# Patient Record
Sex: Male | Born: 1955 | Hispanic: Yes | Marital: Single | Smoking: Former smoker
Health system: Southern US, Community
[De-identification: ages and names within clinical notes are randomized; demographics above are authoritative.]

## PROBLEM LIST (undated history)

## (undated) DIAGNOSIS — G8929 Other chronic pain: Secondary | ICD-10-CM

## (undated) DIAGNOSIS — G4730 Sleep apnea, unspecified: Secondary | ICD-10-CM

## (undated) DIAGNOSIS — Z992 Dependence on renal dialysis: Secondary | ICD-10-CM

## (undated) DIAGNOSIS — M1990 Unspecified osteoarthritis, unspecified site: Secondary | ICD-10-CM

## (undated) DIAGNOSIS — K219 Gastro-esophageal reflux disease without esophagitis: Secondary | ICD-10-CM

## (undated) DIAGNOSIS — Z720 Tobacco use: Secondary | ICD-10-CM

## (undated) DIAGNOSIS — I272 Pulmonary hypertension, unspecified: Secondary | ICD-10-CM

## (undated) DIAGNOSIS — N189 Chronic kidney disease, unspecified: Secondary | ICD-10-CM

## (undated) DIAGNOSIS — I509 Heart failure, unspecified: Secondary | ICD-10-CM

## (undated) DIAGNOSIS — I639 Cerebral infarction, unspecified: Secondary | ICD-10-CM

## (undated) DIAGNOSIS — N179 Acute kidney failure, unspecified: Secondary | ICD-10-CM

## (undated) DIAGNOSIS — D649 Anemia, unspecified: Secondary | ICD-10-CM

## (undated) DIAGNOSIS — M549 Dorsalgia, unspecified: Secondary | ICD-10-CM

## (undated) DIAGNOSIS — I209 Angina pectoris, unspecified: Secondary | ICD-10-CM

## (undated) DIAGNOSIS — N186 End stage renal disease: Secondary | ICD-10-CM

## (undated) DIAGNOSIS — I10 Essential (primary) hypertension: Secondary | ICD-10-CM

## (undated) DIAGNOSIS — R0600 Dyspnea, unspecified: Secondary | ICD-10-CM

## (undated) DIAGNOSIS — R42 Dizziness and giddiness: Secondary | ICD-10-CM

## (undated) HISTORY — DX: Chronic kidney disease, unspecified: N18.9

## (undated) HISTORY — DX: Essential (primary) hypertension: I10

## (undated) HISTORY — DX: Acute kidney failure, unspecified: N17.9

---

## 2011-01-25 ENCOUNTER — Inpatient Hospital Stay (HOSPITAL_COMMUNITY): Payer: Medicaid Other

## 2011-01-25 ENCOUNTER — Emergency Department (HOSPITAL_COMMUNITY): Admission: EM | Admit: 2011-01-25 | Payer: Self-pay

## 2011-01-25 ENCOUNTER — Other Ambulatory Visit: Payer: Self-pay

## 2011-01-25 ENCOUNTER — Inpatient Hospital Stay (HOSPITAL_COMMUNITY)
Admission: AD | Admit: 2011-01-25 | Discharge: 2011-01-29 | DRG: 674 | Disposition: A | Discharge status: Home / Self Care | Payer: Medicaid Other | Source: Other Acute Inpatient Hospital | Attending: Internal Medicine | Admitting: Internal Medicine

## 2011-01-25 DIAGNOSIS — E872 Acidosis, unspecified: Secondary | ICD-10-CM | POA: Diagnosis present

## 2011-01-25 DIAGNOSIS — N189 Chronic kidney disease, unspecified: Secondary | ICD-10-CM | POA: Diagnosis present

## 2011-01-25 DIAGNOSIS — M545 Low back pain, unspecified: Secondary | ICD-10-CM | POA: Diagnosis present

## 2011-01-25 DIAGNOSIS — R197 Diarrhea, unspecified: Secondary | ICD-10-CM | POA: Diagnosis present

## 2011-01-25 DIAGNOSIS — Z23 Encounter for immunization: Secondary | ICD-10-CM

## 2011-01-25 DIAGNOSIS — N179 Acute kidney failure, unspecified: Principal | ICD-10-CM | POA: Diagnosis present

## 2011-01-25 DIAGNOSIS — E860 Dehydration: Secondary | ICD-10-CM | POA: Diagnosis present

## 2011-01-25 DIAGNOSIS — I160 Hypertensive urgency: Secondary | ICD-10-CM | POA: Diagnosis present

## 2011-01-25 DIAGNOSIS — D649 Anemia, unspecified: Secondary | ICD-10-CM | POA: Diagnosis present

## 2011-01-25 DIAGNOSIS — I319 Disease of pericardium, unspecified: Secondary | ICD-10-CM | POA: Diagnosis present

## 2011-01-25 DIAGNOSIS — G8929 Other chronic pain: Secondary | ICD-10-CM | POA: Diagnosis present

## 2011-01-25 DIAGNOSIS — I5042 Chronic combined systolic (congestive) and diastolic (congestive) heart failure: Secondary | ICD-10-CM | POA: Diagnosis present

## 2011-01-25 DIAGNOSIS — I130 Hypertensive heart and chronic kidney disease with heart failure and stage 1 through stage 4 chronic kidney disease, or unspecified chronic kidney disease: Secondary | ICD-10-CM | POA: Diagnosis present

## 2011-01-25 DIAGNOSIS — I509 Heart failure, unspecified: Secondary | ICD-10-CM | POA: Diagnosis present

## 2011-01-25 HISTORY — DX: Heart failure, unspecified: I50.9

## 2011-01-25 HISTORY — DX: Anemia, unspecified: D64.9

## 2011-01-25 LAB — COMPREHENSIVE METABOLIC PANEL
Albumin: 1.7 g/dL — ABNORMAL LOW (ref 3.5–5.2)
Alkaline Phosphatase: 86 U/L (ref 39–117)
BUN: 60 mg/dL — ABNORMAL HIGH (ref 6–23)
CO2: 15 mEq/L — ABNORMAL LOW (ref 19–32)
Chloride: 113 mEq/L — ABNORMAL HIGH (ref 96–112)
GFR calc non Af Amer: 6 mL/min — ABNORMAL LOW (ref >90)
Glucose, Bld: 84 mg/dL (ref 70–99)
Potassium: 3.8 mEq/L (ref 3.5–5.1)
Total Bilirubin: 0.2 mg/dL — ABNORMAL LOW (ref 0.3–1.2)

## 2011-01-25 LAB — PROTIME-INR
INR: 1.09 (ref 0.00–1.49)
Prothrombin Time: 14.3 seconds (ref 11.6–15.2)

## 2011-01-25 LAB — CBC
HCT: 38.5 % — ABNORMAL LOW (ref 39.0–52.0)
MCH: 28.4 pg (ref 26.0–34.0)
MCHC: 33.8 g/dL (ref 30.0–36.0)
MCV: 84.2 fL (ref 78.0–100.0)
Platelets: 333 10*3/uL (ref 150–400)
RDW: 15.7 % — ABNORMAL HIGH (ref 11.5–15.5)

## 2011-01-25 LAB — CARDIAC PANEL(CRET KIN+CKTOT+MB+TROPI)
Total CK: 160 U/L (ref 7–232)
Troponin I: <0.3 ng/mL (ref <0.30)

## 2011-01-25 LAB — MAGNESIUM: Magnesium: 2.3 mg/dL (ref 1.5–2.5)

## 2011-01-25 LAB — CREATININE, SERUM: GFR calc Af Amer: 6 mL/min — ABNORMAL LOW (ref >90)

## 2011-01-25 MED ORDER — POLYETHYLENE GLYCOL 3350 17 G PO PACK
17.0000 g | PACK | Freq: Every day | ORAL | Status: DC | PRN
  Filled 2011-01-25: qty 1

## 2011-01-25 MED ORDER — ONDANSETRON HCL 4 MG PO TABS: 4.0000 mg | ORAL_TABLET | Freq: Four times a day (QID) | ORAL | Status: DC | PRN

## 2011-01-25 MED ORDER — SODIUM CHLORIDE 0.9 % IV SOLN
INTRAVENOUS | Status: DC
  Administered 2011-01-25 – 2011-01-26 (×2): via INTRAVENOUS

## 2011-01-25 MED ORDER — HYDRALAZINE HCL 20 MG/ML IJ SOLN
10.0000 mg | Freq: Three times a day (TID) | INTRAMUSCULAR | Status: DC | PRN
  Administered 2011-01-26: 10 mg via INTRAVENOUS
  Filled 2011-01-25 (×2): qty 0.5

## 2011-01-25 MED ORDER — ACETAMINOPHEN 325 MG PO TABS
650.0000 mg | ORAL_TABLET | Freq: Four times a day (QID) | ORAL | Status: DC | PRN
  Administered 2011-01-28: 650 mg via ORAL
  Filled 2011-01-25: qty 2

## 2011-01-25 MED ORDER — OXYCODONE HCL 5 MG PO TABS
5.0000 mg | ORAL_TABLET | Freq: Four times a day (QID) | ORAL | Status: DC | PRN
  Administered 2011-01-28 – 2011-01-29 (×2): 5 mg via ORAL
  Filled 2011-01-25 (×3): qty 1

## 2011-01-25 MED ORDER — ONDANSETRON HCL 4 MG/2ML IJ SOLN: 4.0000 mg | Freq: Four times a day (QID) | INTRAMUSCULAR | Status: DC | PRN

## 2011-01-25 MED ORDER — LEVALBUTEROL HCL 0.63 MG/3ML IN NEBU
0.6300 mg | INHALATION_SOLUTION | Freq: Four times a day (QID) | RESPIRATORY_TRACT | Status: DC
  Administered 2011-01-25 – 2011-01-26 (×3): 0.63 mg via RESPIRATORY_TRACT
  Filled 2011-01-25 (×7): qty 3

## 2011-01-25 MED ORDER — ENOXAPARIN SODIUM 30 MG/0.3ML ~~LOC~~ SOLN
30.0000 mg | SUBCUTANEOUS | Status: DC
  Administered 2011-01-25 – 2011-01-28 (×4): 30 mg via SUBCUTANEOUS
  Filled 2011-01-25 (×5): qty 0.3

## 2011-01-25 MED ORDER — ACETAMINOPHEN 650 MG RE SUPP: 650.0000 mg | Freq: Four times a day (QID) | RECTAL | Status: DC | PRN

## 2011-01-25 NOTE — H&P (Signed)
JOMAR DENZ MRN: [id number] DOB/AGE: [birth year] 56 y.o. Primary Care Physician:Provider Not In System Admit date: 01/25/2011 Chief Complaint: Diarrhea and abdominal pain  HPI: 56 year old male , who presented to the ED at [facility], with a chief complaint of runny diarrhea for the last 3 months as well as left flank pain. He was found to be in acute renal failure with a creatinine of 9.93. He was found to be fairly hypertensive with systolic blood pressure of 205. He was treated with IV labetalol, prior to being transferred. He has been transferred primarily for his acute renal failure, and the need for dialysis. He denies any hematochezia or melena. Has experienced some nausea but no vomiting for the last several weeks. He has exertional shortness of breath. He denies any chest pain. Denies any fever chills rigors. Denies any recent illness.    No past medical history on file. None  No past surgical history on file. 9  Prior to Admission medications   Not on File   None Allergies: No Known Allergies  Family history negative for diabetes hypertension and dyslipidemia  Social History:  does not have a smoking history on file. He does not have any smokeless tobacco history on file. His alcohol and drug histories not on file.         ROS: A complete 14 point review of systems was done as documented in history of present illness PHYSICAL EXAM: Blood pressure 182/100, pulse 71, temperature 97.9 F (36.6 C), temperature source Oral, resp. rate 20, SpO2 96.00%. General: The patient appears their stated age.  HEENT: No gross abnormalities  Pulmonary: Non labored breathing  Abdomen: Soft and non-tender  Musculoskeletal: There are no major deformities.  Neurologic: No focal weakness or paresthesias are detected,  Skin: There are no ulcer or rashes noted. The right second toe is discolored and tender. There is decreased sensation  Psychiatric: The patient has normal affect.    Cardiovascular: There is a regular rate and rhythm without significant murmur appreciated. Palpable pedal pulse    EKG: Shows left ventricular hypertrophy  No results found for this or any previous visit (from the past 24 hour(s)).  No results found for this or any previous visit (from the past 240 hour(s)).   No results found for this or any previous visit (from the past 48 hour(s)). Laboratory data the hospital shows WBC count of 12.0 hemoglobin of 12.4 hematocrit of 37.5 platelet count of 331, INR of 1.0, sodium 139, potassium 4.1, chloride 112, bicarbonate 15, BUN 58, creatinine 9.93, glucose 94, calcium 7.9, AST of 13 ALT of 22, creatinine kinase of 187 Urine analysis shows a pH of 7.010 urine ketones are negative  Impression:  #1 acute renal failure likely a combination of prerenal, intrarenal etiology, hypertensive nephrosclerosis, dehydration secondary to chronic diarrhea. We'll obtain a CT abdomen and pelvis without contrast. Obtain urine sodium urine creatinine. Nephrology has been consulted. We'll start the patient on IV fluids, to see there is any improvement. Monitor for volume overload. Avoid ACE inhibitors and ARB NSAIDs  #2 hypertension will continue to use when necessary hydralazine. A 2-D echo will be obtained in the morning. We'll cycle cardiac enzymes in the setting of hypertensive urgency           [facility] 01/25/2011, 8:17 PM

## 2011-01-25 NOTE — Progress Notes (Signed)
CRITICAL VALUE ALERT  Critical value received: CK. MB 7.0  Date of notification:  01/25/11 Time of notification: 2045  Critical value read back:yes  Nurse who received alert:  Theresa Mulligan, RN  MD notified (1st page):  Maren Reamer, NP Time of first page: 2048  MD notified (2nd page):  Time of second page: 2100  Responding MD:  Maren Reamer  Time MD responded:  2111  No new ordered received at this time

## 2011-01-26 ENCOUNTER — Other Ambulatory Visit: Payer: Self-pay

## 2011-01-26 ENCOUNTER — Inpatient Hospital Stay (HOSPITAL_COMMUNITY): Payer: Medicaid Other

## 2011-01-26 ENCOUNTER — Encounter (HOSPITAL_COMMUNITY): Payer: Self-pay | Admitting: Family Medicine

## 2011-01-26 LAB — TSH: TSH: 2.12 u[IU]/mL (ref 0.350–4.500)

## 2011-01-26 LAB — BASIC METABOLIC PANEL
CO2: 15 mEq/L — ABNORMAL LOW (ref 19–32)
Calcium: 7.6 mg/dL — ABNORMAL LOW (ref 8.4–10.5)
Creatinine, Ser: 8.75 mg/dL — ABNORMAL HIGH (ref 0.50–1.35)
GFR calc non Af Amer: 6 mL/min — ABNORMAL LOW (ref >90)

## 2011-01-26 LAB — STOOL CULTURE

## 2011-01-26 LAB — IRON AND TIBC
Iron: 57 ug/dL (ref 42–135)
Saturation Ratios: 35 % (ref 20–55)

## 2011-01-26 LAB — URINALYSIS, ROUTINE W REFLEX MICROSCOPIC
Bilirubin Urine: NEGATIVE
Specific Gravity, Urine: 1.019 (ref 1.005–1.030)
pH: 6.5 (ref 5.0–8.0)

## 2011-01-26 LAB — HEMOGLOBIN A1C: Mean Plasma Glucose: 105 mg/dL (ref <117)

## 2011-01-26 LAB — FERRITIN: Ferritin: 317 ng/mL (ref 22–322)

## 2011-01-26 LAB — LIPASE, BLOOD: Lipase: 68 U/L — ABNORMAL HIGH (ref 11–59)

## 2011-01-26 LAB — CBC
MCV: 84.1 fL (ref 78.0–100.0)
Platelets: 307 10*3/uL (ref 150–400)
RDW: 15.5 % (ref 11.5–15.5)
WBC: 10.9 10*3/uL — ABNORMAL HIGH (ref 4.0–10.5)

## 2011-01-26 LAB — URINE MICROSCOPIC-ADD ON

## 2011-01-26 LAB — CARDIAC PANEL(CRET KIN+CKTOT+MB+TROPI): Relative Index: 3.5 — ABNORMAL HIGH (ref 0.0–2.5)

## 2011-01-26 MED ORDER — FUROSEMIDE 10 MG/ML IJ SOLN: 80.0000 mg | Freq: Three times a day (TID) | INTRAMUSCULAR | Status: DC

## 2011-01-26 MED ORDER — LISINOPRIL 20 MG PO TABS
20.0000 mg | ORAL_TABLET | Freq: Every day | ORAL | Status: DC
  Filled 2011-01-26: qty 1

## 2011-01-26 MED ORDER — FUROSEMIDE 10 MG/ML IJ SOLN
160.0000 mg | Freq: Two times a day (BID) | INTRAVENOUS | Status: DC
  Filled 2011-01-26 (×2): qty 16

## 2011-01-26 MED ORDER — METOPROLOL TARTRATE 50 MG PO TABS
50.0000 mg | ORAL_TABLET | Freq: Two times a day (BID) | ORAL | Status: DC
  Administered 2011-01-26 – 2011-01-29 (×7): 50 mg via ORAL
  Filled 2011-01-26 (×8): qty 1

## 2011-01-26 MED ORDER — INFLUENZA VIRUS VACC SPLIT PF IM SUSP
0.5000 mL | INTRAMUSCULAR | Status: AC
  Administered 2011-01-27: 0.5 mL via INTRAMUSCULAR
  Filled 2011-01-26: qty 0.5

## 2011-01-26 MED ORDER — SODIUM BICARBONATE 650 MG PO TABS
650.0000 mg | ORAL_TABLET | Freq: Three times a day (TID) | ORAL | Status: DC
  Administered 2011-01-26 – 2011-01-27 (×3): 650 mg via ORAL
  Filled 2011-01-26 (×5): qty 1

## 2011-01-26 MED ORDER — LEVALBUTEROL HCL 0.63 MG/3ML IN NEBU
0.6300 mg | INHALATION_SOLUTION | Freq: Four times a day (QID) | RESPIRATORY_TRACT | Status: DC | PRN
  Filled 2011-01-26: qty 3

## 2011-01-26 MED ORDER — SODIUM CHLORIDE 0.45 % IV SOLN
INTRAVENOUS | Status: DC
  Filled 2011-01-26: qty 150

## 2011-01-26 MED ORDER — FUROSEMIDE 10 MG/ML IJ SOLN
80.0000 mg | Freq: Three times a day (TID) | INTRAMUSCULAR | Status: AC
  Administered 2011-01-26 – 2011-01-27 (×3): 80 mg via INTRAVENOUS
  Filled 2011-01-26 (×4): qty 8

## 2011-01-26 NOTE — Progress Notes (Addendum)
Subjective: Spoke to patient and he mentions that he has had intermittent blurred vision and headaches for the last 6 months.  Nothing he knows of makes it better or worse.  When he was initially seen at [facility] ED he had a elevated creatinine of 9.93 and it was decided to transfer the patient to [facility] cone for further management and possibly dialysis.   Reportedly his blood pressures have been elevated since admission.  Was placed on hydralazine initially.  Was told that he has never seen a physician before.  Objective: Filed Vitals:   01/26/11 0450 01/26/11 1131 01/26/11 1306 01/26/11 1342  BP: 183/97 174/111 187/119   Pulse: 80 80 83 100  Temp: 98.7 F (37.1 C) 98.1 F (36.7 C) 98.6 F (37 C)   TempSrc: Oral Oral Oral   Resp: 18 17 17 20   Height:      Weight:      SpO2: 95% 98% 99%    Weight change:   Intake/Output Summary (Last 24 hours) at 01/26/11 1415 Last data filed at 01/26/11 1300  Gross per 24 hour  Intake    940 ml  Output    700 ml  Net    240 ml    General: Alert, awake, oriented x3, in no acute distress.  HEENT: No bruits, no goiter.  Heart: Regular rate and rhythm, without murmurs, rubs, gallops.  Lungs: CTA BL Abdomen: Soft, nontender, nondistended, positive bowel sounds.  Neuro: Grossly intact, nonfocal. Answers questions appropriately   Lab Results:  Basename 01/26/11 0540 01/25/11 2116 01/25/11 1942  NA 140 -- 142  K 3.9 -- 3.8  CL 112 -- 113*  CO2 15* -- 15*  GLUCOSE 97 -- 84  BUN 61* -- 60*  CREATININE 8.75* 9.20* --  CALCIUM 7.6* 8.6 --  MG -- 2.3 --  PHOS -- 6.5* --    Basename 01/25/11 1942  AST 6  ALT 7  ALKPHOS 86  BILITOT 0.2*  PROT 5.6*  ALBUMIN 1.7*   No results found for this basename: LIPASE:2,AMYLASE:2 in the last 72 hours  Basename 01/26/11 0540 01/25/11 2116  WBC 10.9* 12.0*  NEUTROABS -- --  HGB 11.4* 13.0  HCT 34.9* 38.5*  MCV 84.1 84.2  PLT 307 333    Basename 01/25/11 1950  CKTOTAL 160  CKMB 7.0*    CKMBINDEX --  TROPONINI <0.30   No components found with this basename: POCBNP:3 No results found for this basename: DDIMER:2 in the last 72 hours  Basename 01/25/11 2116  HGBA1C 5.3   No results found for this basename: CHOL:2,HDL:2,LDLCALC:2,TRIG:2,CHOLHDL:2,LDLDIRECT:2 in the last 72 hours  Basename 01/25/11 2116  TSH 2.120  T4TOTAL --  T3FREE --  THYROIDAB --   No results found for this basename: VITAMINB12:2,FOLATE:2,FERRITIN:2,TIBC:2,IRON:2,RETICCTPCT:2 in the last 72 hours  Micro Results: No results found for this or any previous visit (from the past 240 hour(s)).  Studies/Results: Ct Abdomen Pelvis Wo Contrast  01/26/2011  *RADIOLOGY REPORT*  Clinical Data: Diarrhea, abdominal pain, renal failure, hypertension  CT ABDOMEN AND PELVIS WITHOUT CONTRAST  Technique:  Multidetector CT imaging of the abdomen and pelvis was performed following the standard protocol without intravenous contrast.  Comparison: None.  Findings: Pleural effusions noted bilaterally with basilar atelectasis.  The effusions layer dependently.  Small amount of pericardial fluid noted.  Heart is mildly enlarged.  No hiatal hernia.  Abdomen:  Liver, gallbladder, biliary system,  pancreas, spleen, adrenal glands, and kidneys demonstrate no acute finding and are within normal limits for  noncontrast study.  No evidence of hydronephrosis or obstructing urinary tract calculus.  No abdominal free fluid, fluid collection, hemorrhage, hematoma, adenopathy, or abscess.  No bowel obstruction pattern, dilatation, ileus, or free air.  Normal appendix.  Retained contrast and stool throughout the bowel.  Pelvis:  Foley catheter in the bladder which is decompressed.  No acute distal bowel process, pelvic free fluid, fluid collection, hemorrhage, adenopathy, inguinal abnormality, hernia.  Vascular calcifications evident.  Degenerative changes of the lumbar spine.  No acute osseous finding.  IMPRESSION: Bilateral pleural effusions  layering dependently with basilar atelectasis.  Small pericardial effusion.  Negative for bowel obstruction, dilatation or ileus.  Normal appendix.  No acute intra-abdominal or pelvic process by noncontrast CT.  No obstructing urinary tract stone or obstructive uropathy.  Original Report Authenticated By: Judie Petit. Ruel Favors, M.D.   Dg Chest 2 View  01/25/2011  *RADIOLOGY REPORT*  Clinical Data: Cough, short of breath  CHEST - 2 VIEW  Comparison: 01/25/2011  Findings: Decreased lung volumes with vascular and interstitial prominence.  Basilar atelectasis evident.  Persistent chronic bronchitic changes.  No new consolidation, definite pneumonia, or pneumothorax.  Small effusions noted.  IMPRESSION: Low volume exam with vascular congestion, basilar atelectasis and small effusions.  Minimal interval change.  Original Report Authenticated By: Judie Petit. Ruel Favors, M.D.   US Renal  01/26/2011  *RADIOLOGY REPORT*  Clinical Data:  Acute renal failure  RENAL/URINARY TRACT ULTRASOUND COMPLETE  Comparison:  01/26/2011  Findings: The patient is noted to have a trace ascites and bilateral pleural effusions.  Right Kidney:  Normal in size.  Appears echogenic.  No evidence of mass or hydronephrosis. Mild perinephric fluid.  Left Kidney:  Normal in size. Appears echogenic. No evidence of mass or hydronephrosis. Mild perinephric fluid.  Bladder:  Appears normal for degree of bladder distention.  IMPRESSION: 1.  Bilateral echogenic kidneys without evidence for obstructive uropathy.  Original Report Authenticated By: Rosealee Albee, M.D.    Medications: I have reviewed the patient's current medications.   Patient Active Hospital Problem List: Hypertension Urgency(01/25/2011) At this point hydralazine is on board.  Will plan on adding oral agents.  Will add oral metoprolol today.  Consider other agents like clonidine or amlodipine.  But prefer to add one agent assess response and then add another should patient's BP maintain  elevated.  Pt currently is asymptomatic and has no complaints despite a recent blood pressure of 187/119.   Troponin not elevated currently was <0.30  Acute renal failure (01/25/2011) Per Nephro, likely related to uncontrolled HTN and suspect he has had longstanding elevated blood pressures. Renal ultrasound states no evidence of obstructive uropathy. Will order urine sodium and urine creatinine to calculate FeNa  Diarrhea (01/25/2011) Resolved patient didn't complain of any diarrhea Stool culture and c diff pending.  Disposition:  Pending renal function.  Patient most likely has longstanding hypertension.  Will await nephro's input.  Patient will need PCP as outpatient.      LOS: 1 day   Penny Pia M.D.  [facility] 01/26/2011, 2:15 PM

## 2011-01-26 NOTE — Consult Note (Signed)
Reason for Consult: Elevated Cr,  metabolic acidosis, an d oliguria.  Referring Physician: Penny Pia  PCP: none Insurance: none DOA: 01/25/11  Jonathan Terry is an 56 y.o. male.  HPI: 56 yo M presents following transfer from [facility] ED with complaint of worsening vomiting and diarrhea. He was transferred to [facility] due to AKI with Cr of 9.93 in the setting of elevated BP 204/110. He was treated with labetalol 20 mg IV x 1 prior to transfer. He reports 6 months of intermittent peripheral edema associated with working. He has not worked for the last 3 months and his edema has improved. He reports 3 months of intermittent yet persistent (almost daily) nausea and non-bloody/ non-bilious emesis and diarrhea associated with eating. He used to be a heavy drinker and smoker but stopped this 3 years ago. Now is he is a social drinker, last drank about 10 beers around Christmas. He notes intermittent HA and blurred vision as well as cough productive cough for the last 3 months. He admits to intermittent L sided chest pain that is non-radiating and  non-exertional. The last episode of chest pain was yesterday. He reports subjective fevers. He denies night sweats and weight loss.   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   Pertinent [facility] ED labs/studies: WBC 12, Hgb 12.4, K 4.1, Cl 112, CO2 15, BUN 58, Cr 9.93. UA: sp grav 1.010, 3+ protein, 2+ glucose, 10-20 RBC, 5-10 WBC.   Trend in Creatinine: Creatinine, Ser  Date/Time Value Range Status  01/26/2011  5:40 AM 8.75* 0.50-1.35 (mg/dL) Final  4/69/6295  2:84 PM 9.20* 0.50-1.35 (mg/dL) Final  1/32/4401  0:27 PM 9.08* 0.50-1.35 (mg/dL) Final    PMH:  History reviewed. No pertinent past medical history.  PSH:  History reviewed. No pertinent past surgical history.  Allergies: No Known Allergies  Medications:   Prescriptions prior to admission  Medication Sig Dispense  Refill  . naproxen sodium (ANAPROX) 220 MG tablet Take 220 mg by mouth 3 (three) times daily as needed.        Discontinued Meds:   Medications Discontinued During This Encounter  Medication Reason  . levalbuterol (XOPENEX) nebulizer solution 0.63 mg   . 0.9 %  sodium chloride infusion     Social History:  reports that he has quit smoking. His smoking use included Cigarettes. He quit smokeless tobacco use about 3 years ago. He reports that he drinks about 1.2 ounces of alcohol per week. He reports that he does not use illicit drugs. Originally from [place].   Family History:  History reviewed. No pertinent family history.  Pertinent items are noted in HPI.  Blood pressure 187/119, pulse 100, temperature 98.6 F (37 C), temperature source Oral, resp. rate 20, height 5\' 5"  (1.651 m), weight 158 lb 11.7 oz (72 kg), SpO2 99.00%. General appearance: alert, cooperative and no distress Head: Normocephalic, without obvious abnormality, atraumatic Neck: no adenopathy, no carotid bruit, no JVD, supple, symmetrical, trachea midline and thyroid not enlarged, symmetric, no tenderness/mass/nodules Resp: normal WOB. Decreased BS at bases L>R.  Cardio: regular rate and rhythm, S1, S2 normal, no murmur, click, rub or gallop GI: full, decreased BS. Tympanitic. Mild discomfort with palpation over LLQ. Extremities: extremities normal, atraumatic, no cyanosis or edema Pulses: 2+ and symmetric Skin: Skin color, texture, turgor normal. No rashes or lesions Neurologic: Grossly normal No asterixis.   Labs: Basic Metabolic Panel:  Lab 01/26/11 2536 01/25/11 2116 01/25/11 1942  NA  140 -- 142  K 3.9 -- 3.8  CL 112 -- 113*  CO2 15* -- 15*  GLUCOSE 97 -- 84  BUN 61* -- 60*  CREATININE 8.75* 9.20* 9.08*  ALB -- -- --  CALCIUM 7.6* 8.6 7.9*  PHOS -- 6.5* --  Corrected Calcium: 9.44 AG: 18 (when corrected for low serum albumin) Liver Function Tests:  Lab 01/25/11 1942  AST 6  ALT 7  ALKPHOS 86    BILITOT 0.2*  PROT 5.6*  ALBUMIN 1.7*   No results found for this basename: LIPASE:3,AMYLASE:3 in the last 168 hours No results found for this basename: AMMONIA:3 in the last 168 hours CBC:  Lab 01/26/11 0540 01/25/11 2116  WBC 10.9* 12.0*  NEUTROABS -- --  HGB 11.4* 13.0  HCT 34.9* 38.5*  MCV 84.1 84.2  PLT 307 333   PT/INR: 1.09  Cardiac Enzymes:  Lab 01/25/11 1950  CKTOTAL 160  CKMB 7.0*  CKMBINDEX --  TROPONINI <0.30   ProBNP- 29,725 CBG: No results found for this basename: GLUCAP:5 in the last 168 hours  Iron Studies: No results found for this basename: IRON:30,TIBC:30,TRANSFERRIN:30,FERRITIN:30 in the last 168 hours  Pending studies: Stool Cx, C. Diff, ANA, iPTH, Vit D, UA, UNa, UCl, Ucr, iron, ferritin, TIBC.   Xrays/Other Studies: Ct Abdomen Pelvis Wo Contrast  01/26/2011  *RADIOLOGY REPORT*  Clinical Data: Diarrhea, abdominal pain, renal failure, hypertension  CT ABDOMEN AND PELVIS WITHOUT CONTRAST  Technique:  Multidetector CT imaging of the abdomen and pelvis was performed following the standard protocol without intravenous contrast.  Comparison: None.  Findings: Pleural effusions noted bilaterally with basilar atelectasis.  The effusions layer dependently.  Small amount of pericardial fluid noted.  Heart is mildly enlarged.  No hiatal hernia.  Abdomen:  Liver, gallbladder, biliary system,  pancreas, spleen, adrenal glands, and kidneys demonstrate no acute finding and are within normal limits for noncontrast study.  No evidence of hydronephrosis or obstructing urinary tract calculus.  No abdominal free fluid, fluid collection, hemorrhage, hematoma, adenopathy, or abscess.  No bowel obstruction pattern, dilatation, ileus, or free air.  Normal appendix.  Retained contrast and stool throughout the bowel.  Pelvis:  Foley catheter in the bladder which is decompressed.  No acute distal bowel process, pelvic free fluid, fluid collection, hemorrhage, adenopathy, inguinal  abnormality, hernia.  Vascular calcifications evident.  Degenerative changes of the lumbar spine.  No acute osseous finding.  IMPRESSION: Bilateral pleural effusions layering dependently with basilar atelectasis.  Small pericardial effusion.  Negative for bowel obstruction, dilatation or ileus.  Normal appendix.  No acute intra-abdominal or pelvic process by noncontrast CT.  No obstructing urinary tract stone or obstructive uropathy.  Original Report Authenticated By: Judie Petit. Ruel Favors, M.D.   Dg Chest 2 View  01/25/2011  *RADIOLOGY REPORT*  Clinical Data: Cough, short of breath  CHEST - 2 VIEW  Comparison: 01/25/2011  Findings: Decreased lung volumes with vascular and interstitial prominence.  Basilar atelectasis evident.  Persistent chronic bronchitic changes.  No new consolidation, definite pneumonia, or pneumothorax.  Small effusions noted.  IMPRESSION: Low volume exam with vascular congestion, basilar atelectasis and small effusions.  Minimal interval change.  Original Report Authenticated By: Judie Petit. Ruel Favors, M.D.   US Renal  01/26/2011  *RADIOLOGY REPORT*  Clinical Data:  Acute renal failure  RENAL/URINARY TRACT ULTRASOUND COMPLETE  Comparison:  01/26/2011  Findings: The patient is noted to have a trace ascites and bilateral pleural effusions.  Right Kidney:  Normal in size.  Appears echogenic.  No evidence of mass or hydronephrosis. Mild perinephric fluid.  Left Kidney:  Normal in size. Appears echogenic. No evidence of mass or hydronephrosis. Mild perinephric fluid.  Bladder:  Appears normal for degree of bladder distention.  IMPRESSION: 1.  Bilateral echogenic kidneys without evidence for obstructive uropathy.  Original Report Authenticated By: Rosealee Albee, M.D.  Left kidney: 11.1 cm Right Kidney 11.9 cm   EKG 01/25/11: NSR, anterior T wave inversion, no ST elevation. Mild LVH.  EKG 01/26/11: no change from above.   Assessment/Plan: 56 yo M with AKI in the setting of uncontrolled HTN, CHF  and diarrhea.   1. AKI- very likely  acute on chronic kidney injury but the patient's baseline is unknown. He has elevated Cr which is trending down. No symptoms of azotemia. Normal potassium. Anion gap metabolic acidosis from diarrhea and uremia. Plan: correct metabolic acidosis with oral sodium bicarb 650 mg TID. Treat fluid overload from CHF with high dose lasix which in a lasix naive patient, is80 mg IV q 8 x 3 doses. Strict Is and Os and daily weights. To work up the etiology of his renal injury will f/u urine electrolytes (Na, Cl, Cr, Pr). Will also order an autoimmune and infection work-up (ANA, HIV, RPR, Hepatitis Panel). 2. Metabolic acidosis: as per above.  3. CHF- new onset. F/u ECHO. Ordered an additional set of cardiac enzymes to rule out ACS.  4. Hypertension-BP elevated but improved from admission with metoprolol. Anticipate additional BP control with lasix. Recommend very reduction in BP over next week with goal of SBP 140-130.  5. Anemia-I suspect this is dilutional and will improve with diuretics. Agree with iron studies and will follow.  6. Diarrheal illness: improved with decreased stool output per pt will f/u C.diff and stool culture. Ordered lipase.   FUNCHES,JOSALYN 01/26/2011, 1:54 PM   I have seen and examined this patient and agree with the assessment/plan as outlined above by Funches MD (PGY2). Will work this up in effort to distinguish between CKD and ARF (Rena ultrasound is so far inconclusive). No acute HD needs noted at this time. Clinically appears that he likely has had chronic hypertension that went unrecognized with sequelae of heart/renal disease and this most likely appears to be advanced CKD- however, the recent GI losses raise concern for ARF on CKD. Clinically appears to be euvolemic and given the presence of pleural effusions and small pericardial effusion- discontinue IVFs and start diuretics. Start PO NaHCO3 for acidemia. Michiah Masse K.,MD 01/26/2011 4:41 PM

## 2011-01-26 NOTE — Progress Notes (Signed)
Pt off unit

## 2011-01-27 DIAGNOSIS — I509 Heart failure, unspecified: Secondary | ICD-10-CM

## 2011-01-27 DIAGNOSIS — N186 End stage renal disease: Secondary | ICD-10-CM

## 2011-01-27 DIAGNOSIS — Z992 Dependence on renal dialysis: Secondary | ICD-10-CM

## 2011-01-27 LAB — RENAL FUNCTION PANEL
Albumin: 1.5 g/dL — ABNORMAL LOW (ref 3.5–5.2)
BUN: 60 mg/dL — ABNORMAL HIGH (ref 6–23)
Chloride: 112 mEq/L (ref 96–112)
Creatinine, Ser: 8.84 mg/dL — ABNORMAL HIGH (ref 0.50–1.35)
Glucose, Bld: 124 mg/dL — ABNORMAL HIGH (ref 70–99)

## 2011-01-27 LAB — CBC
HCT: 34.1 % — ABNORMAL LOW (ref 39.0–52.0)
MCH: 27.1 pg (ref 26.0–34.0)
MCV: 85.7 fL (ref 78.0–100.0)
Platelets: 291 10*3/uL (ref 150–400)
RBC: 3.98 MIL/uL — ABNORMAL LOW (ref 4.22–5.81)

## 2011-01-27 LAB — BASIC METABOLIC PANEL
GFR calc Af Amer: 7 mL/min — ABNORMAL LOW (ref >90)
GFR calc non Af Amer: 6 mL/min — ABNORMAL LOW (ref >90)
Potassium: 4.1 mEq/L (ref 3.5–5.1)
Sodium: 138 mEq/L (ref 135–145)

## 2011-01-27 LAB — HEPATITIS PANEL, ACUTE
HCV Ab: NEGATIVE
Hep B C IgM: NEGATIVE
Hepatitis B Surface Ag: NEGATIVE

## 2011-01-27 LAB — HIV ANTIBODY (ROUTINE TESTING W REFLEX): HIV: NONREACTIVE

## 2011-01-27 LAB — RPR: RPR Ser Ql: NONREACTIVE

## 2011-01-27 MED ORDER — AMLODIPINE BESYLATE 5 MG PO TABS
5.0000 mg | ORAL_TABLET | Freq: Every day | ORAL | Status: DC
  Administered 2011-01-27 – 2011-01-28 (×2): 5 mg via ORAL
  Filled 2011-01-27 (×3): qty 1

## 2011-01-27 MED ORDER — SODIUM BICARBONATE 650 MG PO TABS
1300.0000 mg | ORAL_TABLET | Freq: Three times a day (TID) | ORAL | Status: DC
  Administered 2011-01-27 – 2011-01-29 (×4): 1300 mg via ORAL
  Filled 2011-01-27 (×8): qty 2

## 2011-01-27 MED ORDER — CEFUROXIME SODIUM 1.5 G IJ SOLR
1.5000 g | INTRAMUSCULAR | Status: AC
  Administered 2011-01-28: 1.5 g via INTRAVENOUS
  Filled 2011-01-27 (×2): qty 1.5

## 2011-01-27 MED ORDER — INFLUENZA VIRUS VACC SPLIT PF IM SUSP
0.5000 mL | INTRAMUSCULAR | Status: AC
  Filled 2011-01-27: qty 0.5

## 2011-01-27 NOTE — Progress Notes (Signed)
Summary Spoke to patient and he mentions that he has had intermittent blurred vision and headaches for the last 6 months.  Nothing he knows of makes it better or worse.  When he was initially seen at [facility] ED he had a elevated creatinine of 9.93 and it was decided to transfer the patient to [facility] cone for further management and possibly dialysis.   Reportedly his blood pressures have been elevated since admission.  Was placed on hydralazine initially.  Was told that he has never seen a physician before.  Subjective: Patient complains of back pain. He had x-rays done yesterday.  Objective: Filed Vitals:   01/27/11 0507 01/27/11 0952 01/27/11 1409 01/27/11 1724  BP: 176/98 163/94 166/85 155/93  Pulse: 80 73 67 66  Temp: 98.9 F (37.2 C) 98.6 F (37 C) 98.3 F (36.8 C) 98.4 F (36.9 C)  TempSrc: Oral Oral Oral Oral  Resp: 18 17 17 18   Height:      Weight:      SpO2: 98% 96% 99% 95%   Weight change:   Intake/Output Summary (Last 24 hours) at 01/27/11 1827 Last data filed at 01/27/11 1824  Gross per 24 hour  Intake    976 ml  Output   2651 ml  Net  -1675 ml    General: Alert, awake, oriented x3, in no acute distress.  HEENT: No bruits, no goiter.  Heart: Regular rate and rhythm, without murmurs, rubs, gallops.  Lungs: CTA BL Abdomen: Soft, nontender, nondistended, positive bowel sounds.  Neuro: Grossly intact, nonfocal. Answers questions appropriately   Lab Results:  Basename 01/27/11 0924 01/27/11 0618 01/25/11 2116  NA 138 138 --  K 4.0 4.1 --  CL 112 113* --  CO2 14* 13* --  GLUCOSE 124* 85 --  BUN 60* 61* --  CREATININE 8.84* 8.80* --  CALCIUM 7.6* 7.7* --  MG -- -- 2.3  PHOS 5.9* -- 6.5*    Basename 01/27/11 0924 01/25/11 1942  AST -- 6  ALT -- 7  ALKPHOS -- 86  BILITOT -- 0.2*  PROT -- 5.6*  ALBUMIN 1.5* 1.7*    Basename 01/26/11 1511  LIPASE 68*  AMYLASE --    Basename 01/27/11 0924 01/26/11 0540  WBC 10.1 10.9*  NEUTROABS -- --  HGB 10.8*  11.4*  HCT 34.1* 34.9*  MCV 85.7 84.1  PLT 291 307    Basename 01/26/11 1511 01/25/11 1950  CKTOTAL 169 160  CKMB 5.9* 7.0*  CKMBINDEX -- --  TROPONINI <0.30 <0.30   No components found with this basename: POCBNP:3 No results found for this basename: DDIMER:2 in the last 72 hours  Basename 01/25/11 2116  HGBA1C 5.3   No results found for this basename: CHOL:2,HDL:2,LDLCALC:2,TRIG:2,CHOLHDL:2,LDLDIRECT:2 in the last 72 hours  Basename 01/25/11 2116  TSH 2.120  T4TOTAL --  T3FREE --  THYROIDAB --    Basename 01/26/11 1020  VITAMINB12 --  FOLATE --  FERRITIN 317  TIBC 163*  IRON 57  RETICCTPCT --    Micro Results: Recent Results (from the past 240 hour(s))  CLOSTRIDIUM DIFFICILE BY PCR     Status: Normal   Collection Time   01/26/11 10:57 AM      Component Value Range Status Comment   C difficile by pcr NEGATIVE  NEGATIVE  Final   STOOL CULTURE     Status: Normal (Preliminary result)   Collection Time   01/26/11 10:57 AM      Component Value Range Status Comment   Specimen  Description STOOL   Final    Special Requests NONE   Final    Culture Culture reincubated for better growth   Final    Report Status PENDING   Incomplete     Studies/Results: Ct Abdomen Pelvis Wo Contrast  01/26/2011  *RADIOLOGY REPORT*  Clinical Data: Diarrhea, abdominal pain, renal failure, hypertension  CT ABDOMEN AND PELVIS WITHOUT CONTRAST  Technique:  Multidetector CT imaging of the abdomen and pelvis was performed following the standard protocol without intravenous contrast.  Comparison: None.  Findings: Pleural effusions noted bilaterally with basilar atelectasis.  The effusions layer dependently.  Small amount of pericardial fluid noted.  Heart is mildly enlarged.  No hiatal hernia.  Abdomen:  Liver, gallbladder, biliary system,  pancreas, spleen, adrenal glands, and kidneys demonstrate no acute finding and are within normal limits for noncontrast study.  No evidence of hydronephrosis or  obstructing urinary tract calculus.  No abdominal free fluid, fluid collection, hemorrhage, hematoma, adenopathy, or abscess.  No bowel obstruction pattern, dilatation, ileus, or free air.  Normal appendix.  Retained contrast and stool throughout the bowel.  Pelvis:  Foley catheter in the bladder which is decompressed.  No acute distal bowel process, pelvic free fluid, fluid collection, hemorrhage, adenopathy, inguinal abnormality, hernia.  Vascular calcifications evident.  Degenerative changes of the lumbar spine.  No acute osseous finding.  IMPRESSION: Bilateral pleural effusions layering dependently with basilar atelectasis.  Small pericardial effusion.  Negative for bowel obstruction, dilatation or ileus.  Normal appendix.  No acute intra-abdominal or pelvic process by noncontrast CT.  No obstructing urinary tract stone or obstructive uropathy.  Original Report Authenticated By: Judie Petit. Ruel Favors, M.D.   Dg Chest 2 View  01/25/2011  *RADIOLOGY REPORT*  Clinical Data: Cough, short of breath  CHEST - 2 VIEW  Comparison: 01/25/2011  Findings: Decreased lung volumes with vascular and interstitial prominence.  Basilar atelectasis evident.  Persistent chronic bronchitic changes.  No new consolidation, definite pneumonia, or pneumothorax.  Small effusions noted.  IMPRESSION: Low volume exam with vascular congestion, basilar atelectasis and small effusions.  Minimal interval change.  Original Report Authenticated By: Judie Petit. Ruel Favors, M.D.   US Renal  01/26/2011  *RADIOLOGY REPORT*  Clinical Data:  Acute renal failure  RENAL/URINARY TRACT ULTRASOUND COMPLETE  Comparison:  01/26/2011  Findings: The patient is noted to have a trace ascites and bilateral pleural effusions.  Right Kidney:  Normal in size.  Appears echogenic.  No evidence of mass or hydronephrosis. Mild perinephric fluid.  Left Kidney:  Normal in size. Appears echogenic. No evidence of mass or hydronephrosis. Mild perinephric fluid.  Bladder:  Appears  normal for degree of bladder distention.  IMPRESSION: 1.  Bilateral echogenic kidneys without evidence for obstructive uropathy.  Original Report Authenticated By: Rosealee Albee, M.D.    Medications: I have reviewed the patient's current medications.   Patient Active Hospital Problem List: Hypertension Urgency(01/25/2011)  continue current medications. Blood pressure is slowly improving.   Acute renal failure (01/25/2011) Per Nephro, likely related to uncontrolled HTN and suspect he has had longstanding elevated blood pressures. Renal ultrasound states no evidence of obstructive uropathy. Patient to have a fistula placed per vascular surgery..  Diarrhea (01/25/2011) Resolved patient didn't complain of any diarrhea Stool negative for c diff.  Congestive heart failure: 2-D echo showed LVEF of 45%, continue metoprolol and Lasix.  isposition: When medically stable.     LOS: 2 days   Earlene Plater MD, Ladell Pier M.D.  [facility] 01/27/2011, 6:27 PM

## 2011-01-27 NOTE — Progress Notes (Signed)
01/27/11 Nursing Note Reviewed signed and held orders for pre-op. Notified K.Kirby, NP with questions about IV fluids and EKG - was told to leave it for pre-op RN based off of the "phase of care" it was ordered under. Will continue to monitor.  C.Chelsea Pedretti, RN.

## 2011-01-27 NOTE — Consult Note (Addendum)
  Vascular Surgery Consultation  Reason for Consult: vascular access  HPI: Jonathan Terry is a 56 y.o. male who presents for evaluation of vascular access. The patient is right-handed. He has not previously had hemodialysis. Mapping was ordered to be done later today.   History reviewed. No pertinent past medical history. History reviewed. No pertinent past surgical history. History   Social History  . Marital Status: Divorced    Spouse Name: N/A    Number of Children: N/A  . Years of Education: N/A   Occupational History  . unemployed    Social History Main Topics  . Smoking status: Former Smoker    Types: Cigarettes  . Smokeless tobacco: Former Neurosurgeon    Quit date: 01/26/2008  . Alcohol Use: 1.2 oz/week    2 Cans of beer per week  . Drug Use: No  . Sexually Active: No   Other Topics Concern  . None   Social History Narrative   Live alone. From [place]. No wife or children. Unemployed for the past 3 months. Use to work in Glass blower/designer.    History reviewed. No pertinent family history. No Known Allergies Prior to Admission medications   Medication Sig Start Date End Date Taking? Authorizing Provider  naproxen sodium (ANAPROX) 220 MG tablet Take 220 mg by mouth 3 (three) times daily as needed.   Yes Historical Provider, MD     Positive [id number] lower extremity edema. Denies chest pain, this now exertion, PND, orthopnea, claudication, hemoptysis.  All other systems have been reviewed and were otherwise negative with the exception of those mentioned in the HPI and as above.  Physical Exam: Filed Vitals:   01/27/11 0952  BP: 163/94  Pulse: 73  Temp: 98.6 F (37 C)  Resp: 17    General: Alert, no acute distress HEENT: Normal for age Cardiovascular: Regular rate and rhythm. Carotid pulses 2+, no bruits audible Respiratory: Clear to auscultation. No cyanosis, no use of accessory musculature GI: No organomegaly, abdomen is soft and  non-tender Skin: No lesions in the area of chief complaint Neurologic: Sensation intact distally Psychiatric: Patient is competent for consent with normal mood and affect Musculoskeletal: No obvious deformities Extremities: upper extremities have 3+ brachial and 3+ radial pulses palpable. He has an IV in the right forearm cephalic vein. Surface veins on the left are not readily visible.both upper extremities are warm and well perfused Lower reexam reveals 3+ femoral and dorsalis pedis pulses palpable. There is 1+ edema. No varicosities are noted.  Imaging reviewed: vein mapping will be performed later today and reviewed   Assessment/Plan:  We'll schedule for AV fistula left upper extremity tomorrow. Thoroughly discussed this with patient and he would like to proceed.   Josephina Gip, MD 01/27/2011 12:08 PM    01/28/2011 Plan AVF today. Pt re-examined and risks discussed. Proceed now.

## 2011-01-27 NOTE — Progress Notes (Signed)
  Echocardiogram 2D Echocardiogram has been performed.  Juanita Laster Navarro Nine 01/27/2011, 12:45 PM

## 2011-01-27 NOTE — Progress Notes (Signed)
*  PRELIMINARY RESULTS* VASCULAR LAB  Left Upper Extremity Vein Map    Cephalic  Segment Diameter Depth Comment  1. Axilla 3.19mm mm 2.8 mm upper proximal  2. Mid upper arm 2.90mm mm branch  3. Above AC 2.67mm mm   4. In AC 3.28mm mm   5. Below AC 2.73mm mm Branch/after branch 2.37mm  6. Mid forearm 3.81mm mm   7. Wrist 2.44mm mm Medial branch 2.81mm   mm mm    mm mm    mm mm      Jonathan Terry 01/27/2011, 12:27 PM

## 2011-01-27 NOTE — Progress Notes (Signed)
Spoke with patient about PCP. He does not have a PCP but is agreeable with going to [facility] in [place]. Gave patient info on [facility] and explained that he would need to go to the clinic to get an application  and once the clinic has a completed application he can contact them to make an appointment. He stated that he understood and that he would follow up. He stated that his girlfriend would be able to assist him with the application. CM will cont to follow.

## 2011-01-27 NOTE — Consult Note (Signed)
Jonathan Terry is an 56 y.o. male.  Interval history: 24 hr UOP 1925, net negative 1926. Pt c/o some right lower back pain that is chronic. No SOB or swelling.  Pertinent [facility] ED labs/studies: WBC 12, Hgb 12.4, K 4.1, Cl 112, CO2 15, BUN 58, Cr 9.93. UA: sp grav 1.010, 3+ protein, 2+ glucose, 10-20 RBC, 5-10 WBC.   Trend in Creatinine: Creatinine, Ser  Date/Time Value Range Status  01/27/2011  6:18 AM 8.80* 0.50-1.35 (mg/dL) Final  1/61/0960  4:54 AM 8.75* 0.50-1.35 (mg/dL) Final  0/98/1191  4:78 PM 9.20* 0.50-1.35 (mg/dL) Final  2/95/6213  0:86 PM 9.08* 0.50-1.35 (mg/dL) Final    Medications:   Prescriptions prior to admission  Medication Sig Dispense Refill  . naproxen sodium (ANAPROX) 220 MG tablet Take 220 mg by mouth 3 (three) times daily as needed.          . enoxaparin  30 mg Subcutaneous Q24H  . furosemide  80 mg Intravenous TID  . influenza  inactive virus vaccine  0.5 mL Intramuscular Tomorrow-1000  . metoprolol tartrate  50 mg Oral BID  . sodium bicarbonate  650 mg Oral TID  . DISCONTD: furosemide  160 mg Intravenous BID  . DISCONTD: furosemide  80 mg Intravenous TID  . DISCONTD: levalbuterol  0.63 mg Nebulization Q6H  . DISCONTD: lisinopril  20 mg Oral Daily     Discontinued Meds:   Medications Discontinued During This Encounter  Medication Reason  . levalbuterol (XOPENEX) nebulizer solution 0.63 mg   . 0.9 %  sodium chloride infusion   . sodium bicarbonate 150 mEq in sodium chloride 0.45 % 1,000 mL infusion   . lisinopril (PRINIVIL,ZESTRIL) tablet 20 mg   . furosemide (LASIX) 160 mg in dextrose 5 % 50 mL IVPB   . furosemide (LASIX) injection 80 mg     Blood pressure 176/98, pulse 80, temperature 98.9 F (37.2 C), temperature source Oral, resp. rate 18, height 5\' 5"  (1.651 m), weight 158 lb 11.7 oz (72 kg), SpO2 98.00%. General appearance: alert, cooperative and no distress Resp: normal WOB. Decreased BS at bases L>R.  Cardio: regular rate and rhythm,  S1, S2 normal, no murmur, click, rub or gallop GI: full,Tympanitic. Mild discomfort with palpation over LLQ. Extremities: extremities normal, atraumatic, no cyanosis or edema Pulses: 2+ and symmetric Neurologic: Grossly normal No asterixis. A/Ox3  Labs: Basic Metabolic Panel:  Lab 01/27/11 5784 01/26/11 0540 01/25/11 2116 01/25/11 1942  NA 138 140 -- 142  K 4.1 3.9 -- 3.8  CL 113* 112 -- 113*  CO2 13* 15* -- 15*  GLUCOSE 85 97 -- 84  BUN 61* 61* -- 60*  CREATININE 8.80* 8.75* 9.20* 9.08*  ALB -- -- -- --  CALCIUM 7.7* 7.6* 8.6 7.9*  PHOS -- -- 6.5* --  Corrected Calcium: 9.44 AG: 18 (when corrected for low serum albumin) Liver Function Tests:  Lab 01/25/11 1942  AST 6  ALT 7  ALKPHOS 86  BILITOT 0.2*  PROT 5.6*  ALBUMIN 1.7*    Lab 01/26/11 1511  LIPASE 68*  AMYLASE --   No results found for this basename: AMMONIA:3 in the last 168 hours CBC:  Lab 01/26/11 0540 01/25/11 2116  WBC 10.9* 12.0*  NEUTROABS -- --  HGB 11.4* 13.0  HCT 34.9* 38.5*  MCV 84.1 84.2  PLT 307 333   PT/INR: 1.09  Cardiac Enzymes:  Lab 01/26/11 1511 01/25/11 1950  CKTOTAL 169 160  CKMB 5.9* 7.0*  CKMBINDEX -- --  TROPONINI <  0.30 <0.30   ProBNP- 29,725 CBG: No results found for this basename: GLUCAP:5 in the last 168 hours  Iron Studies:   Lab 01/26/11 1020  IRON 57  TIBC 163*  TRANSFERRIN --  FERRITIN 317    Pending studies: Stool Cx, ANA, iPTH,  UA, UNa, UCl, Ucr, echo  Iron 57, ferritin 317, TIBC 163, sat 35%.  C. Diff negative Fena 6.21 Vit D <10, RPR negative HIV NR  Urine dipstick shows positive for WBC's and positive for RBC's.  Micro exam: 11-20 WBC's per HPF, 21-50 RBC's per HPF and hyaline casts seen, amorphous urates/phsoph  Xrays/Other Studies:   US Renal  01/26/2011  *RADIOLOGY REPORT*  Clinical Data:  Acute renal failure  RENAL/URINARY TRACT ULTRASOUND COMPLETE  Comparison:  01/26/2011  Findings: The patient is noted to have a trace ascites and  bilateral pleural effusions.  Right Kidney:  Normal in size.  Appears echogenic.  No evidence of mass or hydronephrosis. Mild perinephric fluid.  Left Kidney:  Normal in size. Appears echogenic. No evidence of mass or hydronephrosis. Mild perinephric fluid.  Bladder:  Appears normal for degree of bladder distention.  IMPRESSION: 1.  Bilateral echogenic kidneys without evidence for obstructive uropathy.  Original Report Authenticated By: Rosealee Albee, M.D.  Left kidney: 11.1 cm Right Kidney 11.9 cm    Assessment/Plan: 56 yo M with AKI in the setting of uncontrolled HTN, CHF and diarrhea.   1. AKI- very likely  acute on chronic kidney injury but the patient's baseline is unknown. He has elevated Cr which is stable. No symptoms of azotemia. Normal potassium. Improved anion gap metabolic acidosis from diarrhea and uremia. Plan: correct metabolic acidosis with oral sodium bicarb 650 mg TID. Treat fluid overload from CHF with high dose lasix which in a lasix naive patient, is 80 mg IV q 8 x 3 doses. Strict Is and Os and daily weights.  Will also f/u an autoimmune and infection work-up (ANA, HIV, RPR, Hepatitis Panel). 2. Metabolic acidosis: as per above.  3. CHF- new onset. F/u ECHO. CKMB fraction elevated. Hypertension-BP elevated but improved from admission with metoprolol. Anticipate additional BP control with lasix. Recommend steady reduction in BP over next week with goal of SBP 140-130.  4. Anemia-Stable, normocytic. I suspect this is dilutional and will improve with diuretics. Iron studies do not show deficiency.  5. Diarrheal illness: improved with decreased stool output per pt will f/u stool culture. Lipase and c diff neg.   KONKOL,JILL 01/27/2011, 8:14 AM   I have seen and examined this patient and agree with the assessment/plan as outlined above by [facility] MD. Plan to do venous mapping today and get permanent HD access placed prior to discharge from the hospital. Serologies so far  negative. Candra Wegner K.,MD 01/27/2011 11:17 AM

## 2011-01-28 ENCOUNTER — Encounter (HOSPITAL_COMMUNITY): Payer: Self-pay | Admitting: Anesthesiology

## 2011-01-28 ENCOUNTER — Encounter (HOSPITAL_COMMUNITY)
Admission: AD | Disposition: A | Discharge status: Home / Self Care | Payer: Self-pay | Source: Other Acute Inpatient Hospital | Attending: Internal Medicine

## 2011-01-28 ENCOUNTER — Encounter (HOSPITAL_COMMUNITY): Payer: Self-pay | Admitting: Certified Registered Nurse Anesthetist

## 2011-01-28 ENCOUNTER — Inpatient Hospital Stay (HOSPITAL_COMMUNITY): Payer: Medicaid Other | Admitting: Anesthesiology

## 2011-01-28 ENCOUNTER — Inpatient Hospital Stay (HOSPITAL_COMMUNITY): Payer: Medicaid Other | Admitting: Certified Registered Nurse Anesthetist

## 2011-01-28 DIAGNOSIS — N179 Acute kidney failure, unspecified: Secondary | ICD-10-CM | POA: Insufficient documentation

## 2011-01-28 DIAGNOSIS — T82898A Other specified complication of vascular prosthetic devices, implants and grafts, initial encounter: Secondary | ICD-10-CM

## 2011-01-28 HISTORY — PX: AV FISTULA PLACEMENT: SHX1204

## 2011-01-28 LAB — PROTEIN ELECTROPHORESIS, SERUM
M-Spike, %: NOT DETECTED g/dL
Total Protein ELP: 4.8 g/dL — ABNORMAL LOW (ref 6.0–8.3)

## 2011-01-28 LAB — CBC
HCT: 33.5 % — ABNORMAL LOW (ref 39.0–52.0)
Hemoglobin: 10.8 g/dL — ABNORMAL LOW (ref 13.0–17.0)
MCH: 27.5 pg (ref 26.0–34.0)
MCHC: 32.2 g/dL (ref 30.0–36.0)
MCV: 85.2 fL (ref 78.0–100.0)
RDW: 15.6 % — ABNORMAL HIGH (ref 11.5–15.5)

## 2011-01-28 LAB — UIFE/LIGHT CHAINS/TP QN, 24-HR UR
Alpha 2, Urine: DETECTED — AB
Free Kappa Lt Chains,Ur: 26.4 mg/dL — ABNORMAL HIGH (ref 0.14–2.42)
Free Kappa/Lambda Ratio: 2.34 ratio (ref 2.04–10.37)
Gamma Globulin, Urine: DETECTED — AB
Total Protein, Urine: 466.3 mg/dL

## 2011-01-28 LAB — RENAL FUNCTION PANEL
BUN: 63 mg/dL — ABNORMAL HIGH (ref 6–23)
Calcium: 7.8 mg/dL — ABNORMAL LOW (ref 8.4–10.5)
Creatinine, Ser: 8.97 mg/dL — ABNORMAL HIGH (ref 0.50–1.35)
Glucose, Bld: 94 mg/dL (ref 70–99)
Phosphorus: 5.8 mg/dL — ABNORMAL HIGH (ref 2.3–4.6)
Sodium: 141 mEq/L (ref 135–145)

## 2011-01-28 SURGERY — ARTERIOVENOUS (AV) FISTULA CREATION
Anesthesia: Monitor Anesthesia Care | Site: Arm Upper | Laterality: Left | Wound class: Clean

## 2011-01-28 SURGERY — ARTERIOVENOUS (AV) FISTULA CREATION
Anesthesia: Monitor Anesthesia Care | Site: Arm Lower | Laterality: Left

## 2011-01-28 MED ORDER — LACTATED RINGERS IV SOLN: INTRAVENOUS | Status: DC

## 2011-01-28 MED ORDER — MORPHINE SULFATE 2 MG/ML IJ SOLN: 0.0500 mg/kg | INTRAMUSCULAR | Status: DC | PRN

## 2011-01-28 MED ORDER — FENTANYL CITRATE 0.05 MG/ML IJ SOLN: 50.0000 ug | INTRAMUSCULAR | Status: DC | PRN

## 2011-01-28 MED ORDER — CALCITRIOL 0.25 MCG PO CAPS
0.2500 ug | ORAL_CAPSULE | Freq: Every day | ORAL | Status: DC
  Administered 2011-01-28 – 2011-01-29 (×2): 0.25 ug via ORAL
  Filled 2011-01-28 (×3): qty 1

## 2011-01-28 MED ORDER — SODIUM CHLORIDE 0.9 % IV SOLN
INTRAVENOUS | Status: DC | PRN
  Administered 2011-01-28 (×2): via INTRAVENOUS

## 2011-01-28 MED ORDER — ONDANSETRON HCL 4 MG/2ML IJ SOLN: 4.0000 mg | Freq: Once | INTRAMUSCULAR | Status: AC | PRN

## 2011-01-28 MED ORDER — 0.9 % SODIUM CHLORIDE (POUR BTL) OPTIME
TOPICAL | Status: DC | PRN
  Administered 2011-01-28: 200 mL

## 2011-01-28 MED ORDER — MEPERIDINE HCL 25 MG/ML IJ SOLN: 6.2500 mg | INTRAMUSCULAR | Status: DC | PRN

## 2011-01-28 MED ORDER — DROPERIDOL 2.5 MG/ML IJ SOLN
0.6250 mg | INTRAMUSCULAR | Status: DC | PRN
  Filled 2011-01-28: qty 0.25

## 2011-01-28 MED ORDER — SODIUM CHLORIDE 0.9 % IV SOLN
INTRAVENOUS | Status: DC
  Administered 2011-01-28: 09:00:00 via INTRAVENOUS

## 2011-01-28 MED ORDER — LIDOCAINE-EPINEPHRINE (PF) 1 %-1:200000 IJ SOLN
INTRAMUSCULAR | Status: DC | PRN
  Administered 2011-01-28: 10 mL

## 2011-01-28 MED ORDER — HYDROMORPHONE HCL PF 1 MG/ML IJ SOLN: 0.2500 mg | INTRAMUSCULAR | Status: DC | PRN

## 2011-01-28 MED ORDER — SODIUM CHLORIDE 0.9 % IR SOLN
Status: DC | PRN
  Administered 2011-01-28: 11:00:00

## 2011-01-28 MED ORDER — SODIUM CHLORIDE 0.9 % IV SOLN
INTRAVENOUS | Status: DC | PRN
  Administered 2011-01-28: 10:00:00 via INTRAVENOUS

## 2011-01-28 MED ORDER — HEPARIN SODIUM (PORCINE) 1000 UNIT/ML IJ SOLN
INTRAMUSCULAR | Status: DC | PRN
  Administered 2011-01-28: 6 mL via INTRAVENOUS

## 2011-01-28 MED ORDER — OXYCODONE-ACETAMINOPHEN 5-325 MG PO TABS: 1.0000 | ORAL_TABLET | ORAL | Status: DC | PRN

## 2011-01-28 MED ORDER — PROPOFOL 10 MG/ML IV EMUL
INTRAVENOUS | Status: DC | PRN
  Administered 2011-01-28: 120 ug/kg/min via INTRAVENOUS

## 2011-01-28 MED ORDER — MIDAZOLAM HCL 2 MG/2ML IJ SOLN: 1.0000 mg | INTRAMUSCULAR | Status: DC | PRN

## 2011-01-28 MED ORDER — PROPOFOL 10 MG/ML IV EMUL
INTRAVENOUS | Status: DC | PRN
  Administered 2011-01-28: 100 ug/kg/min via INTRAVENOUS

## 2011-01-28 MED ORDER — VITAMIN D (ERGOCALCIFEROL) 1.25 MG (50000 UNIT) PO CAPS
50000.0000 [IU] | ORAL_CAPSULE | ORAL | Status: DC
  Administered 2011-01-28: 50000 [IU] via ORAL
  Filled 2011-01-28: qty 1

## 2011-01-28 MED ORDER — CALCIUM CARBONATE ANTACID 500 MG PO CHEW
1.0000 | CHEWABLE_TABLET | Freq: Three times a day (TID) | ORAL | Status: DC
  Administered 2011-01-28 – 2011-01-29 (×3): 200 mg via ORAL
  Filled 2011-01-28 (×5): qty 1

## 2011-01-28 SURGICAL SUPPLY — 36 items
CANISTER SUCTION 2500CC (MISCELLANEOUS) ×3 IMPLANT
CLIP TI MEDIUM 6 (CLIP) ×3 IMPLANT
CLIP TI WIDE RED SMALL 6 (CLIP) ×3 IMPLANT
CLOTH BEACON ORANGE TIMEOUT ST (SAFETY) ×3 IMPLANT
COVER PROBE W GEL 5X96 (DRAPES) ×3 IMPLANT
COVER SURGICAL LIGHT HANDLE (MISCELLANEOUS) ×6 IMPLANT
DECANTER SPIKE VIAL GLASS SM (MISCELLANEOUS) ×3 IMPLANT
DERMABOND ADHESIVE PROPEN (GAUZE/BANDAGES/DRESSINGS) ×1
DERMABOND ADVANCED (GAUZE/BANDAGES/DRESSINGS) ×1
DERMABOND ADVANCED .7 DNX12 (GAUZE/BANDAGES/DRESSINGS) ×2 IMPLANT
DERMABOND ADVANCED .7 DNX6 (GAUZE/BANDAGES/DRESSINGS) ×2 IMPLANT
DRAIN PENROSE 1/2X12 LTX STRL (WOUND CARE) IMPLANT
ELECT REM PT RETURN 9FT ADLT (ELECTROSURGICAL) ×3
ELECTRODE REM PT RTRN 9FT ADLT (ELECTROSURGICAL) ×2 IMPLANT
GLOVE BIO SURGEON STRL SZ7.5 (GLOVE) ×3 IMPLANT
GLOVE BIOGEL PI IND STRL 6.5 (GLOVE) ×4 IMPLANT
GLOVE BIOGEL PI IND STRL 7.5 (GLOVE) ×2 IMPLANT
GLOVE BIOGEL PI INDICATOR 6.5 (GLOVE) ×2
GLOVE BIOGEL PI INDICATOR 7.5 (GLOVE) ×1
GLOVE ECLIPSE 6.5 STRL STRAW (GLOVE) ×3 IMPLANT
GOWN STRL NON-REIN LRG LVL3 (GOWN DISPOSABLE) ×6 IMPLANT
KIT BASIN OR (CUSTOM PROCEDURE TRAY) ×3 IMPLANT
KIT ROOM TURNOVER OR (KITS) ×3 IMPLANT
NS IRRIG 1000ML POUR BTL (IV SOLUTION) ×3 IMPLANT
PACK CV ACCESS (CUSTOM PROCEDURE TRAY) ×3 IMPLANT
PAD ARMBOARD 7.5X6 YLW CONV (MISCELLANEOUS) ×6 IMPLANT
SPONGE GAUZE 4X4 12PLY (GAUZE/BANDAGES/DRESSINGS) ×3 IMPLANT
SPONGE SURGIFOAM ABS GEL 100 (HEMOSTASIS) IMPLANT
SUT PROLENE 6 0 BV (SUTURE) ×3 IMPLANT
SUT VIC AB 3-0 SH 27 (SUTURE) ×1
SUT VIC AB 3-0 SH 27X BRD (SUTURE) ×2 IMPLANT
SUT VICRYL 4-0 PS2 18IN ABS (SUTURE) ×3 IMPLANT
TOWEL OR 17X24 6PK STRL BLUE (TOWEL DISPOSABLE) ×3 IMPLANT
TOWEL OR 17X26 10 PK STRL BLUE (TOWEL DISPOSABLE) ×3 IMPLANT
UNDERPAD 30X30 INCONTINENT (UNDERPADS AND DIAPERS) ×3 IMPLANT
WATER STERILE IRR 1000ML POUR (IV SOLUTION) ×3 IMPLANT

## 2011-01-28 SURGICAL SUPPLY — 30 items
CANISTER SUCTION 2500CC (MISCELLANEOUS) ×2 IMPLANT
CLIP TI MEDIUM 6 (CLIP) ×2 IMPLANT
CLIP TI WIDE RED SMALL 6 (CLIP) ×2 IMPLANT
CLOTH BEACON ORANGE TIMEOUT ST (SAFETY) ×2 IMPLANT
COVER PROBE W GEL 5X96 (DRAPES) IMPLANT
COVER SURGICAL LIGHT HANDLE (MISCELLANEOUS) ×4 IMPLANT
DERMABOND ADVANCED (GAUZE/BANDAGES/DRESSINGS) ×1
DERMABOND ADVANCED .7 DNX12 (GAUZE/BANDAGES/DRESSINGS) ×1 IMPLANT
ELECT REM PT RETURN 9FT ADLT (ELECTROSURGICAL) ×2
ELECTRODE REM PT RTRN 9FT ADLT (ELECTROSURGICAL) ×1 IMPLANT
GEL ULTRASOUND 20GR AQUASONIC (MISCELLANEOUS) IMPLANT
GLOVE BIOGEL PI IND STRL 6.5 (GLOVE) ×3 IMPLANT
GLOVE BIOGEL PI INDICATOR 6.5 (GLOVE) ×3
GLOVE ECLIPSE 6.5 STRL STRAW (GLOVE) ×2 IMPLANT
GLOVE SS BIOGEL STRL SZ 7 (GLOVE) ×1 IMPLANT
GLOVE SUPERSENSE BIOGEL SZ 7 (GLOVE) ×1
GOWN STRL NON-REIN LRG LVL3 (GOWN DISPOSABLE) ×4 IMPLANT
KIT BASIN OR (CUSTOM PROCEDURE TRAY) ×2 IMPLANT
KIT ROOM TURNOVER OR (KITS) ×2 IMPLANT
NS IRRIG 1000ML POUR BTL (IV SOLUTION) ×2 IMPLANT
PACK CV ACCESS (CUSTOM PROCEDURE TRAY) ×2 IMPLANT
PAD ARMBOARD 7.5X6 YLW CONV (MISCELLANEOUS) ×4 IMPLANT
SPONGE GAUZE 4X4 12PLY (GAUZE/BANDAGES/DRESSINGS) ×2 IMPLANT
SUT PROLENE 6 0 BV (SUTURE) ×4 IMPLANT
SUT VIC AB 3-0 SH 27 (SUTURE) ×1
SUT VIC AB 3-0 SH 27X BRD (SUTURE) ×1 IMPLANT
TOWEL OR 17X24 6PK STRL BLUE (TOWEL DISPOSABLE) ×2 IMPLANT
TOWEL OR 17X26 10 PK STRL BLUE (TOWEL DISPOSABLE) ×2 IMPLANT
UNDERPAD 30X30 INCONTINENT (UNDERPADS AND DIAPERS) ×2 IMPLANT
WATER STERILE IRR 1000ML POUR (IV SOLUTION) ×2 IMPLANT

## 2011-01-28 NOTE — Progress Notes (Signed)
Summary Spoke to patient and he mentions that he has had intermittent blurred vision and headaches for the last 6 months.  Nothing he knows of makes it better or worse.  When he was initially seen at [facility] ED he had a elevated creatinine of 9.93 and it was decided to transfer the patient to [facility] cone for further management and possibly dialysis.   Reportedly his blood pressures have been elevated since admission.  Was placed on hydralazine initially.  Was told that he has never seen a physician before.  Subjective: Patient had fistula placed today.  He is eating dinner and feels fine.   Objective: Filed Vitals:   01/28/11 1415 01/28/11 1454 01/28/11 1538 01/28/11 1807  BP: 130/68  155/73 134/74  Pulse:   75 67  Temp:  97.7 F (36.5 C) 97.5 F (36.4 C) 97.7 F (36.5 C)  TempSrc:   Oral Oral  Resp:   18 18  Height:      Weight:      SpO2:   96% 99%   Weight change:   Intake/Output Summary (Last 24 hours) at 01/28/11 2202 Last data filed at 01/28/11 1739  Gross per 24 hour  Intake    940 ml  Output    800 ml  Net    140 ml    General: Alert, awake, oriented x3, in no acute distress.  HEENT: No bruits, no goiter.  Heart: Regular rate and rhythm, without murmurs, rubs, gallops.  Lungs: CTA BL Abdomen: Soft, nontender, nondistended, positive bowel sounds.  Neuro: Grossly intact, nonfocal. Answers questions appropriately   Lab Results:  [facility] 01/28/11 0558 01/27/11 0924  NA 141 138  K 4.1 4.0  CL 113* 112  CO2 15* 14*  GLUCOSE 94 124*  BUN 63* 60*  CREATININE 8.97* 8.84*  CALCIUM 7.8* 7.6*  MG -- --  PHOS 5.8* 5.9*    Basename 01/28/11 0558 01/27/11 0924  AST -- --  ALT -- --  ALKPHOS -- --  BILITOT -- --  PROT -- --  ALBUMIN 1.7* 1.5*    Basename 01/26/11 1511  LIPASE 68*  AMYLASE --    Basename 01/28/11 0558 01/27/11 0924  WBC 10.7* 10.1  NEUTROABS -- --  HGB 10.8* 10.8*  HCT 33.5* 34.1*  MCV 85.2 85.7  PLT 275 291    Basename 01/26/11  1511  CKTOTAL 169  CKMB 5.9*  CKMBINDEX --  TROPONINI <0.30   No components found with this basename: POCBNP:3 No results found for this basename: DDIMER:2 in the last 72 hours No results found for this basename: HGBA1C:2 in the last 72 hours No results found for this basename: CHOL:2,HDL:2,LDLCALC:2,TRIG:2,CHOLHDL:2,LDLDIRECT:2 in the last 72 hours No results found for this basename: TSH,T4TOTAL,FREET3,T3FREE,THYROIDAB in the last 72 hours  Basename 01/26/11 1020  VITAMINB12 --  FOLATE --  FERRITIN 317  TIBC 163*  IRON 57  RETICCTPCT --    Micro Results: Recent Results (from the past 240 hour(s))  CLOSTRIDIUM DIFFICILE BY PCR     Status: Normal   Collection Time   01/26/11 10:57 AM      Component Value Range Status Comment   C difficile by pcr NEGATIVE  NEGATIVE  Final   STOOL CULTURE     Status: Normal (Preliminary result)   Collection Time   01/26/11 10:57 AM      Component Value Range Status Comment   Specimen Description STOOL   Final    Special Requests NONE   Final  Culture NO SUSPICIOUS COLONIES, CONTINUING TO HOLD   Final    Report Status PENDING   Incomplete     Studies/Results: No results found.  Medications: I have reviewed the patient's current medications.   Patient Active Hospital Problem List: Hypertension Urgency(01/25/2011)  continue current medications. Blood pressure is slowly improving.   Acute renal failure (01/25/2011) Per Nephro, likely related to uncontrolled HTN and suspect he has had longstanding elevated blood pressures. Renal ultrasound states no evidence of obstructive uropathy. Patient had  fistula placed per vascular surgery..  Diarrhea (01/25/2011) Resolved patient didn't complain of any diarrhea Stool negative for c diff.  Congestive heart failure: 2-D echo showed LVEF of 45%, continue metoprolol and Lasix.  Disposition: in AM     LOS: 3 days   Earlene Plater MD, Ladell Pier M.D.  [facility] 01/28/2011, 10:02 PM

## 2011-01-28 NOTE — Transfer of Care (Signed)
Immediate Anesthesia Transfer of Care Note  Patient: Jonathan Terry  Procedure(s) Performed:  ARTERIOVENOUS (AV) FISTULA CREATION  Patient Location: PACU  Anesthesia Type: MAC  Level of Consciousness: awake, alert , oriented and patient cooperative  Airway & Oxygen Therapy: Patient Spontanous Breathing and Patient connected to nasal cannula oxygen  Post-op Assessment: Report given to PACU RN  Post vital signs: Reviewed and stable Filed Vitals:   01/28/11 1149  BP:   Pulse:   Temp: 36.6 C  Resp:     Complications: No apparent anesthesia complications

## 2011-01-28 NOTE — Progress Notes (Signed)
Patient off unit

## 2011-01-28 NOTE — Anesthesia Postprocedure Evaluation (Signed)
Anesthesia Post Note  Patient: Jonathan Terry  Procedure(s) Performed:  ARTERIOVENOUS (AV) FISTULA CREATION  Anesthesia type: general  Patient location: PACU  Post pain: Pain level controlled  Post assessment: Patient's Cardiovascular Status Stable  Last Vitals:  Filed Vitals:   01/28/11 0807  BP: 159/83  Pulse: 80  Temp: 37.1 C  Resp: 17    Post vital signs: Reviewed and stable  Level of consciousness: sedated  Complications: No apparent anesthesia complications

## 2011-01-28 NOTE — Progress Notes (Signed)
Pt returned to unit with a new AV Fistula placed in his LUA. Positive for bruit and thrill. Has an dermabond dressing present. Some swelling. Complains of moderate pain 7 out 10. Some bruising present around incision site. Pt able to move arm with minimal difficulty. No distress present. Will cont to monitor.

## 2011-01-28 NOTE — Op Note (Signed)
NAME: Jonathan Terry   MRN: [id number] DOB: 1955-02-02    DATE OF OPERATION: 01/28/2011  PREOP DIAGNOSIS: chronic kidney disease stage V  POSTOP DIAGNOSIS: same  PROCEDURE: left brachial cephalic AV fistula  SURGEON: Di Kindle. Edilia Bo, MD, FACS  ASSIST: Della Goo PA  ANESTHESIA: local with sedation   EBL: minimal  INDICATIONS: [name] is a 56 y.o. male had a left radiocephalic fistula earlier today by Dr. Hart Rochester. This occluded in the PACU and I was asked to attempt a brachiocephalic fistula.  FINDINGS: this was a four and a half millimeter vein. The artery had an excellent pulse.  TECHNIQUE: The patient was taken to the operating room and sedated by anesthesia. The left upper extremity was prepped and draped in the usual sterile fashion. A transverse incision was made just above the antecubital level after the skin was anesthetized. The cephalic vein was dissected free and ligated distally. It distended up nicely with heparinized saline. It was a 4.5 mm vein. The artery was dissected free beneath the fascia. The patient was heparinized. After the heparin had circulated, the brachial artery was clamped proximally and distally and a longitudinal arteriotomy was made. The vein was cut the appropriate length and sewn end to side to the artery using a continuous 6-0 Prolene suture. At the completion was an excellent thrill in the fistula. There was a good ulnar signal with the Doppler. Heparin was partially reversed with protamine. The wounds closed with a deep layer of 3-0 Vicryl. The skin was closed with 4-0 Vicryl. Dermabond was applied. The patient tolerated the procedure well and was transferred to the recovery room in stable condition. All needle and sponge counts were correct.  Waverly Ferrari, MD, FACS [facility]  DATE OF DICTATION:   01/28/2011

## 2011-01-28 NOTE — Op Note (Signed)
OPERATIVE REPORT  Date of Surgery: 01/25/2011 - 01/28/2011  Surgeon: Josephina Gip, MD  Assistant: Della Goo Pre-op Diagnosis: ESRD  Post-op Diagnosis:same Procedure: Procedure(s): ARTERIOVENOUS (AV) FISTULA CREATION-left radial to cephalic Anesthesia: MAC  EBL: Minimal  Complications: None  Procedure Details: The patient was taken to the operating room place in the supine position at which time the left upper extremity was prepped with Betadine scrub and solution and draped in routine sterile manner.after infiltration with 1% Xylocaine with epinephrine a longitudinal incision was made midway between the radial artery and cephalic vein just proximal to the left wrist. The vein was carefully dissected free. Branches were ligated with 3 and 4-0 silk and divided. Button was gently dilated with upper saline after transecting it and ligating it distally. It was an adequate vein for a fistula. Radial artery was exposed beneath the flexure. It was mildly diseased but was a large vessel 3 mm in size. It was then occluded proximally and distally with vascular bulldogs opened longitudinally with a 15 blade extended with Potts scissors. There was excellent inflow. Lungs carefully measured spatulated and anastomosed end to side with 6-0 Prolene. Vessel loops were released and there was a good pulse in good Doppler flow midway up the forearm and the cephalic vein. Adequate hemostasis was achieved and the wound closed in layers with Vicryl in a subcuticular fashion with Dermabond. The patient was taken to the recovery room in stable condition  Josephina Gip, MD 01/28/2011 11:40 AM

## 2011-01-28 NOTE — Preoperative (Signed)
Beta Blockers   Reason not to administer Beta Blockers:Not Applicable 

## 2011-01-28 NOTE — Anesthesia Procedure Notes (Signed)
Procedure Name: MAC Date/Time: 01/28/2011 10:52 AM Performed by: Shirlyn Goltz, TOM Pre-anesthesia Checklist: Patient identified, Emergency Drugs available, Suction available, Patient being monitored and Timeout performed Patient Re-evaluated:Patient Re-evaluated prior to inductionOxygen Delivery Method: Simple face mask Intubation Type: IV induction Placement Confirmation: positive ETCO2

## 2011-01-28 NOTE — Progress Notes (Signed)
Patient off of unit

## 2011-01-28 NOTE — Consult Note (Signed)
Jonathan Terry is an 56 y.o. male.  Interval history: 24 hr UOP 2075, net negative 1235. Pt c/o some right lower back pain that is chronic. No SOB or swelling.  Pertinent [facility] ED labs/studies: WBC 12, Hgb 12.4, K 4.1, Cl 112, CO2 15, BUN 58, Cr 9.93. UA: sp grav 1.010, 3+ protein, 2+ glucose, 10-20 RBC, 5-10 WBC.   Trend in Creatinine: Creatinine, Ser  Date/Time Value Range Status  01/27/2011  9:24 AM 8.84* 0.50-1.35 (mg/dL) Final  1/61/0960  4:54 AM 8.80* 0.50-1.35 (mg/dL) Final  0/98/1191  4:78 AM 8.75* 0.50-1.35 (mg/dL) Final  2/95/6213  0:86 PM 9.20* 0.50-1.35 (mg/dL) Final  5/78/4696  2:95 PM 9.08* 0.50-1.35 (mg/dL) Final    Medications:   Prescriptions prior to admission  Medication Sig Dispense Refill  . naproxen sodium (ANAPROX) 220 MG tablet Take 220 mg by mouth 3 (three) times daily as needed.          Marland Kitchen amLODipine  5 mg Oral QHS  . cefUROXime (ZINACEF)  IV  1.5 g Intravenous 60 min Pre-Op  . enoxaparin  30 mg Subcutaneous Q24H  . furosemide  80 mg Intravenous TID  . influenza  inactive virus vaccine  0.5 mL Intramuscular Tomorrow-1000  . influenza  inactive virus vaccine  0.5 mL Intramuscular Tomorrow-1000  . metoprolol tartrate  50 mg Oral BID  . sodium bicarbonate  1,300 mg Oral TID  . DISCONTD: sodium bicarbonate  650 mg Oral TID     Discontinued Meds:   Medications Discontinued During This Encounter  Medication Reason  . levalbuterol (XOPENEX) nebulizer solution 0.63 mg   . 0.9 %  sodium chloride infusion   . sodium bicarbonate 150 mEq in sodium chloride 0.45 % 1,000 mL infusion   . lisinopril (PRINIVIL,ZESTRIL) tablet 20 mg   . furosemide (LASIX) 160 mg in dextrose 5 % 50 mL IVPB   . furosemide (LASIX) injection 80 mg   . sodium bicarbonate tablet 650 mg     Blood pressure 153/76, pulse 69, temperature 99.3 F (37.4 C), temperature source Oral, resp. rate 18, height 5\' 5"  (1.651 m), weight 161 lb 9.6 oz (73.3 kg), SpO2 100.00%. Exam deferred due to  being gone to OR.  Labs: Basic Metabolic Panel: .  Lab 01/28/11 0558 01/27/11 0924 01/27/11 0618 01/26/11 0540 01/25/11 2116 01/25/11 1942  NA 141 138 138 140 -- 142  K 4.1 4.0 4.1 3.9 -- 3.8  CL 113* 112 113* 112 -- 113*  CO2 15* 14* 13* 15* -- 15*  GLUCOSE 94 124* 85 97 -- 84  BUN 63* 60* 61* 61* -- 60*  CREATININE 8.97* 8.84* 8.80* 8.75* 9.20* 9.08*  ALB -- -- -- -- -- --  CALCIUM 7.8* 7.6* 7.7* 7.6* 8.6 7.9*  PHOS 5.8* 5.9* -- -- 6.5* --  Corrected Calcium: 9.44 AG: 18 (when corrected for low serum albumin) Liver Function Tests:  Lab 01/27/11 0924 01/25/11 1942  AST -- 6  ALT -- 7  ALKPHOS -- 86  BILITOT -- 0.2*  PROT -- 5.6*  ALBUMIN 1.5* 1.7*    Lab 01/26/11 1511  LIPASE 68*  AMYLASE --   No results found for this basename: AMMONIA:3 in the last 168 hours CBC:  Lab 01/28/11 0558 01/27/11 0924 01/26/11 0540 01/25/11 2116  WBC 10.7* 10.1 10.9* 12.0*  NEUTROABS -- -- -- --  HGB 10.8* 10.8* 11.4* 13.0  HCT 33.5* 34.1* 34.9* 38.5*  MCV 85.2 85.7 84.1 84.2  PLT 275 291 307 333  PT/INR: 1.09  Cardiac Enzymes:  Lab 01/26/11 1511 01/25/11 1950  CKTOTAL 169 160  CKMB 5.9* 7.0*  CKMBINDEX -- --  TROPONINI <0.30 <0.30   ProBNP- 29,725 CBG: No results found for this basename: GLUCAP:5 in the last 168 hours  Iron Studies:   Lab 01/26/11 1020  IRON 57  TIBC 163*  TRANSFERRIN --  FERRITIN 317    Pending studies: Stool Cx  Iron 57, ferritin 317, TIBC 163, sat 35%.  C. Diff negative Fena 6.21 Vit D <10, RPR negative HIV NR  iPTH 362, ANA negative SPEP/UPEP elevated light chains. Urine dipstick shows positive for WBC's and positive for RBC's.  Micro exam: 11-20 WBC's per HPF, 21-50 RBC's per HPF and hyaline casts seen, amorphous urates/phsoph  Xrays/Other Studies:   US Renal  01/26/2011  *RADIOLOGY REPORT*  Clinical Data:  Acute renal failure  RENAL/URINARY TRACT ULTRASOUND COMPLETE  Comparison:  01/26/2011  Findings: The patient is noted to  have a trace ascites and bilateral pleural effusions.  Right Kidney:  Normal in size.  Appears echogenic.  No evidence of mass or hydronephrosis. Mild perinephric fluid.  Left Kidney:  Normal in size. Appears echogenic. No evidence of mass or hydronephrosis. Mild perinephric fluid.  Bladder:  Appears normal for degree of bladder distention.  IMPRESSION: 1.  Bilateral echogenic kidneys without evidence for obstructive uropathy.  Original Report Authenticated By: Rosealee Albee, M.D.  Left kidney: 11.1 cm Right Kidney 11.9 cm    Assessment/Plan: 56 yo M with AKI in the setting of uncontrolled HTN, CHF and diarrhea.   1. AKI- very likely  acute on chronic kidney injury but the patient's baseline is unknown. He has elevated Cr which is stable. No symptoms of azotemia. Normal potassium. Improved anion gap metabolic acidosis from diarrhea and uremia. Plan: correct metabolic acidosis-oral sodium bicarb 1300 mg TID. Fluid overload treated with 80 mg IV q 8 x 3 doses and discontinued yesterday with continued good UOP. Strict Is and Os and daily weights.  Will f/u autoimmune and infection work-up, negative thus far. Treat HTN slowly improving on metoprolol and low dose amlodipine. 2. Metabolic acidosis: slow improvement on oral bicarb.  3. CHF- new onset diffuse hypokinesis and EF 45%. Hypertension-BP elevated but improved from admission with metoprolol and norvasc. Recommend steady reduction in BP over next week with goal of SBP 140-130.  4. Anemia-Stable, normocytic. I suspect this is dilutional and will improve with diuretics. Iron studies do not show deficiency.  5. Diarrheal illness: improved with decreased stool output per pt will f/u stool culture. Lipase and c diff neg.  6. MBD. Hyperparathyroidism and very low Vitamin D. Will start calcitriol and replace ergocalciferol 50000 u qweek.  Leighla Chestnutt 01/28/2011, 7:49 AM

## 2011-01-28 NOTE — H&P (Signed)
  [facility]  This patient underwent a left radiocephalic AV fistula by Dr. Hart Rochester this morning. Please refer to the history and physical previously from this morning. The fistula clotted in the recovery room and I've been asked to attempt an upper arm fistula on the left. I have discussed this with the patient and he is agreeable to proceed.  Di Kindle. Edilia Bo, MD, FACS Beeper [phone] 01/28/2011

## 2011-01-28 NOTE — Anesthesia Preprocedure Evaluation (Addendum)
Anesthesia Evaluation  Patient identified by MRN, date of birth, ID band Patient awake    Reviewed: Allergy & Precautions, H&P , NPO status   Airway Mallampati: II TM Distance: >3 FB Neck ROM: Full    Dental  (+) Dental Advisory Given   Pulmonary former smoker   Pulmonary exam normal       Cardiovascular hypertension, Pt. on medications     Neuro/Psych    GI/Hepatic   Endo/Other  Secondary Hyperparathyroidism  Renal/GU ARFRenal disease     Musculoskeletal   Abdominal   Peds  Hematology   Anesthesia Other Findings   Reproductive/Obstetrics                          Anesthesia Physical Anesthesia Plan  ASA: III  Anesthesia Plan: MAC   Post-op Pain Management:    Induction: Intravenous  Airway Management Planned: Simple Face Mask  Additional Equipment:   Intra-op Plan:   Post-operative Plan:   Informed Consent: I have reviewed the patients History and Physical, chart, labs and discussed the procedure including the risks, benefits and alternatives for the proposed anesthesia with the patient or authorized representative who has indicated his/her understanding and acceptance.   Dental advisory given  Plan Discussed with: CRNA and Anesthesiologist  Anesthesia Plan Comments:         Anesthesia Quick Evaluation

## 2011-01-28 NOTE — Anesthesia Preprocedure Evaluation (Addendum)
Anesthesia Evaluation   Patient awake    Reviewed: Allergy & Precautions, H&P , NPO status , Patient's Chart, lab work & pertinent test results  Airway Mallampati: II  Neck ROM: Full    Dental  (+) Dental Advisory Given   Pulmonary former smoker         Cardiovascular hypertension, Pt. on medications     Neuro/Psych    GI/Hepatic   Endo/Other  Secondary Hyperparathyroidism   Renal/GU ARFRenal disease     Musculoskeletal   Abdominal   Peds  Hematology   Anesthesia Other Findings   Reproductive/Obstetrics                         Anesthesia Physical Anesthesia Plan  ASA: III  Anesthesia Plan: MAC   Post-op Pain Management:    Induction: Intravenous  Airway Management Planned: Nasal Cannula  Additional Equipment:   Intra-op Plan:   Post-operative Plan:   Informed Consent: I have reviewed the patients History and Physical, chart, labs and discussed the procedure including the risks, benefits and alternatives for the proposed anesthesia with the patient or authorized representative who has indicated his/her understanding and acceptance.   Dental advisory given  Plan Discussed with: CRNA, Surgeon and Anesthesiologist  Anesthesia Plan Comments:        Anesthesia Quick Evaluation

## 2011-01-28 NOTE — Anesthesia Postprocedure Evaluation (Signed)
  Anesthesia Post-op Note  Patient: Jonathan Terry  Procedure(s) Performed:  ARTERIOVENOUS (AV) FISTULA CREATION  Patient Location: PACU  Anesthesia Type: MAC  Level of Consciousness: awake, alert  and oriented  Airway and Oxygen Therapy: Patient Spontanous Breathing  Post-op Pain: mild  Post-op Assessment: Post-op Vital signs reviewed and Patient's Cardiovascular Status Stable  Post-op Vital Signs: stable  Complications: No apparent anesthesia complications

## 2011-01-28 NOTE — Transfer of Care (Signed)
Immediate Anesthesia Transfer of Care Note  Patient: Jonathan Terry  Procedure(s) Performed:  ARTERIOVENOUS (AV) FISTULA CREATION  Patient Location: PACU  Anesthesia Type: MAC  Level of Consciousness: awake, alert , oriented and patient cooperative  Airway & Oxygen Therapy: Patient Spontanous Breathing and Patient connected to nasal cannula oxygen  Post-op Assessment: Report given to PACU RN, Post -op Vital signs reviewed and stable and Patient moving all extremities X 4  Post vital signs: Reviewed and stable Filed Vitals:   01/28/11 1400  BP:   Pulse:   Temp: 36.4 C  Resp:     Complications: No apparent anesthesia complications

## 2011-01-29 ENCOUNTER — Encounter (HOSPITAL_COMMUNITY): Payer: Self-pay | Admitting: *Deleted

## 2011-01-29 DIAGNOSIS — I5042 Chronic combined systolic (congestive) and diastolic (congestive) heart failure: Secondary | ICD-10-CM | POA: Diagnosis present

## 2011-01-29 DIAGNOSIS — E872 Acidosis, unspecified: Secondary | ICD-10-CM | POA: Diagnosis present

## 2011-01-29 DIAGNOSIS — D649 Anemia, unspecified: Secondary | ICD-10-CM

## 2011-01-29 DIAGNOSIS — I509 Heart failure, unspecified: Secondary | ICD-10-CM

## 2011-01-29 HISTORY — DX: Anemia, unspecified: D64.9

## 2011-01-29 HISTORY — DX: Heart failure, unspecified: I50.9

## 2011-01-29 LAB — BASIC METABOLIC PANEL
BUN: 63 mg/dL — ABNORMAL HIGH (ref 6–23)
Calcium: 8 mg/dL — ABNORMAL LOW (ref 8.4–10.5)
Creatinine, Ser: 9.18 mg/dL — ABNORMAL HIGH (ref 0.50–1.35)
GFR calc non Af Amer: 6 mL/min — ABNORMAL LOW (ref >90)
Glucose, Bld: 84 mg/dL (ref 70–99)

## 2011-01-29 MED ORDER — CALCIUM CARBONATE ANTACID 500 MG PO CHEW: 1.0000 | CHEWABLE_TABLET | Freq: Three times a day (TID) | ORAL | Status: DC

## 2011-01-29 MED ORDER — AMLODIPINE BESYLATE 5 MG PO TABS: 5.0000 mg | ORAL_TABLET | Freq: Every day | ORAL | Status: DC

## 2011-01-29 MED ORDER — FUROSEMIDE 40 MG PO TABS: 40.0000 mg | ORAL_TABLET | Freq: Every day | ORAL | Status: DC

## 2011-01-29 MED ORDER — METOPROLOL TARTRATE 50 MG PO TABS: 50.0000 mg | ORAL_TABLET | Freq: Two times a day (BID) | ORAL | Status: DC

## 2011-01-29 MED ORDER — VITAMIN D (ERGOCALCIFEROL) 1.25 MG (50000 UNIT) PO CAPS: 50000.0000 [IU] | ORAL_CAPSULE | ORAL | Status: DC

## 2011-01-29 MED ORDER — FUROSEMIDE 20 MG PO TABS: 20.0000 mg | ORAL_TABLET | Freq: Every day | ORAL | Status: DC

## 2011-01-29 MED ORDER — SODIUM BICARBONATE 650 MG PO TABS: 1300.0000 mg | ORAL_TABLET | Freq: Three times a day (TID) | ORAL | Status: DC

## 2011-01-29 MED ORDER — CALCITRIOL 0.25 MCG PO CAPS: 0.2500 ug | ORAL_CAPSULE | Freq: Every day | ORAL | Status: DC

## 2011-01-29 MED ORDER — OXYCODONE-ACETAMINOPHEN 5-325 MG PO TABS: 1.0000 | ORAL_TABLET | ORAL | Status: AC | PRN

## 2011-01-29 MED ORDER — FUROSEMIDE 20 MG PO TABS
20.0000 mg | ORAL_TABLET | Freq: Every day | ORAL | Status: DC
  Administered 2011-01-29: 20 mg via ORAL
  Filled 2011-01-29: qty 1

## 2011-01-29 NOTE — Progress Notes (Signed)
Subjective: Interval History: Left AV fistula placed yesterday. Pt has questions regarding dialysis and work restrictions bc he is in [employer] previously. 24 hr UOP 400, net pos 540.  No SOB or swelling.  Objective:  Vital signs in last 24 hours:  Temp:  [97.5 F (36.4 C)-98.7 F (37.1 C)] 98.6 F (37 C) (01/18 0300) Pulse Rate:  [64-80] 77  (01/18 0300) Resp:  [15-26] 18  (01/18 0300) BP: (94-159)/(42-83) 142/72 mmHg (01/18 0300) SpO2:  [93 %-99 %] 98 % (01/18 0300) Weight:  [158 lb 11.7 oz (72 kg)] 158 lb 11.7 oz (72 kg) (01/17 2146) Weight change: -2 lb 13.9 oz (-1.3 kg)  Intake/Output: I/O last 3 completed shifts: In: 1180 [P.O.:480; I.V.:700] Out: 1050 [Urine:1050]  Intake/Output this shift:     Intake/Output Summary (Last 24 hours) at 01/29/11 0741 Last data filed at 01/28/11 1739  Gross per 24 hour  Intake    940 ml  Output    400 ml  Net    540 ml      . amLODipine  5 mg Oral QHS  . calcitRIOL  0.25 mcg Oral Daily  . calcium carbonate  1 tablet Oral TID WC  . cefUROXime (ZINACEF)  IV  1.5 g Intravenous 60 min Pre-Op  . enoxaparin  30 mg Subcutaneous Q24H  . influenza  inactive virus vaccine  0.5 mL Intramuscular Tomorrow-1000  . metoprolol tartrate  50 mg Oral BID  . sodium bicarbonate  1,300 mg Oral TID  . Vitamin D (Ergocalciferol)  50,000 Units Oral Q7 days    EXAM:  General appearance: alert, cooperative and no distress  Resp: normal WOB. Decreased BS at bases L>R.  Cardio: regular rate and rhythm, S1, S2 normal, no murmur, click, rub or gallop  GI: full,Tympanitic. Mild discomfort with palpation over LLQ.  Extremities: extremities normal, atraumatic, no cyanosis or edema. LUE AVF with good thrill palpated. Pulses: 2+ and symmetric  Neurologic: Grossly normal No asterixis. A/Ox3   Lab Results:  Basename 01/28/11 0558 01/27/11 0924  WBC 10.7* 10.1  HGB 10.8* 10.8*  HCT 33.5* 34.1*  PLT 275 291   BMET  Basename 01/29/11 0500 01/28/11 0558  01/27/11 0924  NA 141 141 138  K 4.6 4.1 4.0  CL 114* 113* 112  CO2 16* 15* 14*  GLUCOSE 84 94 124*  BUN 63* 63* 60*  CREATININE 9.18* 8.97* 8.84*  CALCIUM 8.0* 7.8* 7.6*  PHOS -- 5.8* 5.9*   LFT  Basename 01/28/11 0558  PROT --  ALBUMIN 1.7*  AST --  ALT --  ALKPHOS --  BILITOT --  BILIDIR --  IBILI --   PT/INR No results found for this basename: LABPROT:2,INR:2 in the last 72 hours Hepatitis Panel  Basename 01/26/11 1020  HEPBSAG NEGATIVE  HCVAB NEGATIVE  HEPAIGM NEGATIVE  HEPBIGM NEGATIVE   PTH: Lab Results  Component Value Date   PTH 362.9* 01/26/2011   CALCIUM 8.0* 01/29/2011   PHOS 5.8* 01/28/2011   Pending studies: Stool Cx  Iron 57, ferritin 317, TIBC 163, sat 35%.  C. Diff negative  Fena 6.21  Vit D <10,  RPR negative  HIV NR  iPTH 362,  ANA negative  SPEP/UPEP elevated light chains.  Urine dipstick shows positive for WBC's and positive for RBC's. Micro exam: 11-20 WBC's per HPF, 21-50 RBC's per HPF and hyaline casts seen, amorphous urates/phsoph  Studies/Results: No results found.  Assessment/Plan:  56 yo M with AKI in the setting of uncontrolled HTN, CHF and  diarrhea.  1. AKI- very likely acute on chronic kidney injury but the patient's baseline is unknown. He has elevated Cr which is stable. No symptoms of azotemia. Normal potassium. Improved anion gap metabolic acidosis from diarrhea and uremia. Plan: correct metabolic acidosis-oral sodium bicarb 1300 mg TID. Diuresis prn, had good response to lasix on day after admission. Strict Is and Os and daily weights. Will f/u autoimmune and infection work-up, negative thus far. Treat HTN slowly improving on metoprolol and low dose amlodipine. 2. Metabolic acidosis: slow improvement on oral bicarb.  3. CHF- new onset diffuse hypokinesis and EF 45%. Hypertension-BP elevated but improved from admission with metoprolol and norvasc. Recommend steady reduction in BP over next week with goal of SBP 140-130.   4. Anemia-Stable, normocytic. Iron studies do not show deficiency.  5. Diarrheal illness: improved with decreased stool output per pt will f/u stool culture. Lipase and c diff neg.  6. MBD. Hyperparathyroidism and very low Vitamin D. Continue calcitriol and replace ergocalciferol 50000 u qweek. Jonathan Terry  01/29/2011 7:49 AM

## 2011-01-29 NOTE — Progress Notes (Signed)
I have seen and examined this patient and agree with the assessment/plan as outlined above by [facility] MD. Plan for possible DC later today to f/u with me as an outpatient in clinic now s/p BC-AVF. Clinically doing well and compliance encouraged Hien Cunliffe K.,MD 01/29/2011 11:06 AM

## 2011-01-29 NOTE — Progress Notes (Signed)
[facility] [place]  Progress Note Bypass Surgery  Date of Surgery: 01/25/2011 - 01/28/2011  Procedure(s): ARTERIOVENOUS (AV) FISTULA CREATION Surgeon: Surgeon(s): Chuck Hint, MD  1 Day Post-Op  History of Present Illness  Jonathan Terry is a 56 y.o. male who is S/P Procedure(s): ARTERIOVENOUS (AV) FISTULA CREATION left.  The patient's pre-op symptoms of failure of the  left radiocephalic fistula earlier today by Dr. Hart Rochester. This occluded in the PACU and I was asked to attempt a brachiocephalic fistula. left brachial cephalic AV fistula with excellent pulses post operatively.  Patients pain is well controlled.          Imaging: No results found.  Significant Diagnostic Studies: CBC    Component Value Date/Time   WBC 10.7* 01/28/2011 0558   RBC 3.93* 01/28/2011 0558   HGB 10.8* 01/28/2011 0558   HCT 33.5* 01/28/2011 0558   PLT 275 01/28/2011 0558   MCV 85.2 01/28/2011 0558   MCH 27.5 01/28/2011 0558   MCHC 32.2 01/28/2011 0558   RDW 15.6* 01/28/2011 0558    BMET    Component Value Date/Time   NA 141 01/29/2011 0500   K 4.6 01/29/2011 0500   CL 114* 01/29/2011 0500   CO2 16* 01/29/2011 0500   GLUCOSE 84 01/29/2011 0500   BUN 63* 01/29/2011 0500   CREATININE 9.18* 01/29/2011 0500   CALCIUM 8.0* 01/29/2011 0500   GFRNONAA 6* 01/29/2011 0500   GFRAA 6* 01/29/2011 0500    COAG Lab Results  Component Value Date   INR 1.09 01/25/2011   No results found for this basename: PTT    Physical Examination  BP Readings from Last 3 Encounters:  01/29/11 136/68  01/29/11 136/68  01/29/11 136/68   Temp Readings from Last 3 Encounters:  01/29/11 98.3 F (36.8 C) Oral  01/29/11 98.3 F (36.8 C) Oral  01/29/11 98.3 F (36.8 C) Oral   SpO2 Readings from Last 3 Encounters:  01/29/11 97%  01/29/11 97%  01/29/11 97%   Pulse Readings from Last 3 Encounters:  01/29/11 65  01/29/11 65  01/29/11 65    Pt is A&O x 3 left upper extremity:  Incision/s is/are clean, dry, intact or clean,dry.intact, and  healing without hematoma, erythema or drainage Limb is warm; with good color. Palp radial and ulnar pulses left upper ext.    Assessment: Pt. Doing well Post-op pain is controlled Wounds are clean, dry, intact or healing well Bypass is open and extremities are well perfused  Plan:  Continue wound care as ordered  Mosetta Pigeon [phone] 01/29/2011 9:59 AM

## 2011-01-29 NOTE — Progress Notes (Signed)
   CARE MANAGEMENT NOTE 01/29/2011  Patient:  Jonathan Terry, Jonathan Terry   Account Number:  [id number]  Date Initiated:  01/27/2011  Documentation initiated by:  [facility]  Subjective/Objective Assessment:   Admitted with acute renal failure and HTN emergency. Lives alone but has supportive girlfriend who assists him prn. Independent with mobility and ADLs.     Action/Plan:   Anticipated DC Date:  01/29/2011   Anticipated DC Plan:  HOME/SELF CARE      DC Planning Services  CM consult  [facility]      Choice offered to / List presented to:             Status of service:  Completed, signed off Medicare Important Message given?   (If response is "NO", the following Medicare IM given date fields will be blank) Date Medicare IM given:   Date Additional Medicare IM given:    Discharge Disposition:  HOME/SELF CARE  Per UR Regulation:  Reviewed for med. necessity/level of care/duration of stay  Comments:  01/27/11 Spoke with patient about PCP. He does not have a PCP but is agreeable with going to [facility] in [place]. Gave patient info on [facility] and explained that he would need to go to the clinic to get an application  and once the clinic has a completed application he can contact them to make an appointment. He stated that he understood and that he would follow up. He stated that his girlfriend would be able to assist him with the application. CM will cont to follow. Jacquelynn Cree RN, BSN, CCM

## 2011-01-29 NOTE — Anesthesia Postprocedure Evaluation (Signed)
Anesthesia Post Note  Patient: Jonathan Terry  Procedure(s) Performed:  ARTERIOVENOUS (AV) FISTULA CREATION  Anesthesia type: MAC  Patient location: PACU  Post pain: Pain level controlled  Post assessment: Patient's Cardiovascular Status Stable  Last Vitals:  Filed Vitals:   01/29/11 1416  BP: 158/83  Pulse: 67  Temp: 36.8 C  Resp: 17    Post vital signs: Reviewed and stable  Level of consciousness: sedated  Complications: No apparent anesthesia complications

## 2011-01-29 NOTE — Discharge Summary (Signed)
Physician Discharge Summary  Patient ID: Jonathan Terry MRN: [id number] DOB/AGE: [birth year] 56 y.o.  Admit date: 01/25/2011 Discharge date: 01/29/2011  Discharge Diagnoses:  Acute renal failure  Hypertensive urgency  Diarrhea  Congestive heart failure  Anemia  Metabolic acidosis  Hyperphosphatemia Vitamin D deficiency   Current Discharge Medication List    START taking these medications   Details  amLODipine (NORVASC) 5 MG tablet Take 1 tablet (5 mg total) by mouth at bedtime. Qty: 30 tablet, Refills: 0    calcitRIOL (ROCALTROL) 0.25 MCG capsule Take 1 capsule (0.25 mcg total) by mouth daily. Qty: 30 capsule, Refills: 0    furosemide (LASIX) 40 MG tablet Take 1 tablet (40 mg total) by mouth daily. Qty: 30 tablet, Refills: 0    metoprolol (LOPRESSOR) 50 MG tablet Take 1 tablet (50 mg total) by mouth 2 (two) times daily. Qty: 60 tablet, Refills: 0    oxyCODONE-acetaminophen (ROXICET) 5-325 MG per tablet Take 1-2 tablets by mouth every 4 (four) hours as needed for pain. Qty: 30 tablet, Refills: 0    sodium bicarbonate 650 MG tablet Take 2 tablets (1,300 mg total) by mouth 3 (three) times daily. Qty: 90 tablet, Refills: 0    Vitamin D, Ergocalciferol, (DRISDOL) 50000 UNITS CAPS Take 1 capsule (50,000 Units total) by mouth every 7 (seven) days. Qty: 8 capsule, Refills: 0      STOP taking these medications     naproxen sodium (ANAPROX) 220 MG tablet         Discharge Orders    Future Appointments: Provider: Department: Dept Phone: Center:   02/23/2011 8:45 AM Pryor Ochoa, MD [facility] [phone] [facility]     Future Orders Please Complete By Expires   Resume previous diet      Discharge instructions      Comments:   ACTIVITY: Rest today. You may resume her normal activity tomorrow.  DIET: Resume your previous diet.  WOUND CARE: If you have a dressing, this can be removed in 48 hours. Otherwise, keep your incisions clean and dry. Elevate the affected limb on  a pillow. You may shower starting 48 hours after your surgery.  SPECIAL INSTRUCTIONS: You will have mild to moderate discomfort at the incision and graft site. Call your doctor for: persistent or heavy bleeding at the surgical site, increasing redness or swelling of the incision, a temperature greater than 101, severe pain or loss of feeling in the hand or foot on the side of surgery.  FOLLOW-UP: in 4 weeks. Office will call  [facility]  OFFICE NUMBER: [phone]   Increase activity slowly         Follow-up Information    Follow up with LAWSON,JAMES D, MD in 4 weeks. (office will arrange - sent)    Contact information:   [address] [place] [address] [phone]       Follow up with Dagoberto Ligas., MD. (you will be called with appointment)    Contact information:   [address] [facility] [place] [address] [phone]          Disposition: Final discharge disposition not confirmed  Discharged Condition: Stable   Consults: Treatment Team:  Cecille Aver, MD Pryor Ochoa, MD  HPI: HPI: 56 year old male , who presented to the ED at [facility], with a chief complaint of runny diarrhea for the last 3 months as well as left flank pain. He was found to be in acute renal failure with  a creatinine of 9.93. He was found to be fairly hypertensive with systolic blood pressure of 205. He was treated with IV labetalol, prior to being transferred. He has been transferred primarily for his acute renal failure, and the need for dialysis. He denies any hematochezia or melena. Has experienced some nausea but no vomiting for the last several weeks. He has exertional shortness of breath. He denies any chest pain. Denies any fever chills rigors. Denies any recent illness.   PMH:  As per H & P FH: As per  H & P SH: As per H & P Med: as per H & P Allergies and ROS: as per H&P   Discharge Exam: Blood pressure  136/68, pulse 65, temperature 98.3 F (36.8 C), temperature source Oral, resp. rate 17, height 5\' 5"  (1.651 m), weight 72 kg (158 lb 11.7 oz), SpO2 97.00%. General: Alert, awake, oriented x3, in no acute distress.  HEENT: No bruits, no goiter.  Heart: Regular rate and rhythm, without murmurs, rubs, gallops.  Lungs: CTA BL  Abdomen: Soft, nontender, nondistended, positive bowel sounds.  Neuro: Grossly intact, nonfocal. Answers questions appropriately Extremity: 1+ edema bilaterally.    Hospital Course:  Acute renal failure/chronic renal failure Patient's acute on chronic renal failure is most likely secondary to long-standing uncontrolled hypertension. He was seen by nephrology and  has AV fistula placed for dialysis when needed. He will be discharged home he will follow up with nephrology.  Hypertensive urgency Patient's blood pressure is presently controlled. Will continue him on his Norvasc and metoprolol as well as Lasix.  Diarrhea Diarrhea has resolved and C-Diff is negative.   Congestive heart failure Patient with new onset CHF. LV function decreased 45%. This is most likely as well secondary to uncontrolled hypertension. Patient was started on a beta blocker. Patient can followup outpatient with PCP. And with nephrology.   Anemia Patient's hemoglobin is stable. Most likely anemia is secondary to decreased production from  kidney disease. Will BE monitored by nephrology outpatient.   Metabolic acidosis Metabolic acidosis secondary to renal disease. Patient placed on calcium carbonate. Bicarbonate can be monitored outpatient.   Hyperphosphatemia Patient placed on phosphate binder. He will continue calcitriol outpatient.  Vitamin D deficiency Patient is on vitamin D supplements along with calcium   Labs:   Results for orders placed during the hospital encounter of 01/25/11 (from the past 48 hour(s))  RENAL FUNCTION PANEL     Status: Abnormal   Collection Time   01/28/11   5:58 AM      Component Value Range Comment   Sodium 141  135 - 145 (mEq/L)    Potassium 4.1  3.5 - 5.1 (mEq/L)    Chloride 113 (*) 96 - 112 (mEq/L)    CO2 15 (*) 19 - 32 (mEq/L)    Glucose, Bld 94  70 - 99 (mg/dL)    BUN 63 (*) 6 - 23 (mg/dL)    Creatinine, Ser 9.56 (*) 0.50 - 1.35 (mg/dL)    Calcium 7.8 (*) 8.4 - 10.5 (mg/dL)    Phosphorus 5.8 (*) 2.3 - 4.6 (mg/dL)    Albumin 1.7 (*) 3.5 - 5.2 (g/dL)    GFR calc non Af Amer 6 (*) >90 (mL/min)    GFR calc Af Amer 7 (*) >90 (mL/min)   CBC     Status: Abnormal   Collection Time   01/28/11  5:58 AM      Component Value Range Comment   WBC 10.7 (*) 4.0 - 10.5 (  K/uL)    RBC 3.93 (*) 4.22 - 5.81 (MIL/uL)    Hemoglobin 10.8 (*) 13.0 - 17.0 (g/dL)    HCT 16.1 (*) 09.6 - 52.0 (%)    MCV 85.2  78.0 - 100.0 (fL)    MCH 27.5  26.0 - 34.0 (pg)    MCHC 32.2  30.0 - 36.0 (g/dL)    RDW 04.5 (*) 40.9 - 15.5 (%)    Platelets 275  150 - 400 (K/uL)   BASIC METABOLIC PANEL     Status: Abnormal   Collection Time   01/29/11  5:00 AM      Component Value Range Comment   Sodium 141  135 - 145 (mEq/L)    Potassium 4.6  3.5 - 5.1 (mEq/L)    Chloride 114 (*) 96 - 112 (mEq/L)    CO2 16 (*) 19 - 32 (mEq/L)    Glucose, Bld 84  70 - 99 (mg/dL)    BUN 63 (*) 6 - 23 (mg/dL)    Creatinine, Ser 8.11 (*) 0.50 - 1.35 (mg/dL)    Calcium 8.0 (*) 8.4 - 10.5 (mg/dL)    GFR calc non Af Amer 6 (*) >90 (mL/min)    GFR calc Af Amer 6 (*) >90 (mL/min)     Diagnostics:  Ct Abdomen Pelvis Wo Contrast  01/26/2011  *RADIOLOGY REPORT*  Clinical Data: Diarrhea, abdominal pain, renal failure, hypertension  CT ABDOMEN AND PELVIS WITHOUT CONTRAST  Technique:  Multidetector CT imaging of the abdomen and pelvis was performed following the standard protocol without intravenous contrast.  Comparison: None.  Findings: Pleural effusions noted bilaterally with basilar atelectasis.  The effusions layer dependently.  Small amount of pericardial fluid noted.  Heart is mildly enlarged.  No  hiatal hernia.  Abdomen:  Liver, gallbladder, biliary system,  pancreas, spleen, adrenal glands, and kidneys demonstrate no acute finding and are within normal limits for noncontrast study.  No evidence of hydronephrosis or obstructing urinary tract calculus.  No abdominal free fluid, fluid collection, hemorrhage, hematoma, adenopathy, or abscess.  No bowel obstruction pattern, dilatation, ileus, or free air.  Normal appendix.  Retained contrast and stool throughout the bowel.  Pelvis:  Foley catheter in the bladder which is decompressed.  No acute distal bowel process, pelvic free fluid, fluid collection, hemorrhage, adenopathy, inguinal abnormality, hernia.  Vascular calcifications evident.  Degenerative changes of the lumbar spine.  No acute osseous finding.  IMPRESSION: Bilateral pleural effusions layering dependently with basilar atelectasis.  Small pericardial effusion.  Negative for bowel obstruction, dilatation or ileus.  Normal appendix.  No acute intra-abdominal or pelvic process by noncontrast CT.  No obstructing urinary tract stone or obstructive uropathy.  Original Report Authenticated By: Judie Petit. Ruel Favors, M.D.   Dg Chest 2 View  01/25/2011  *RADIOLOGY REPORT*  Clinical Data: Cough, short of breath  CHEST - 2 VIEW  Comparison: 01/25/2011  Findings: Decreased lung volumes with vascular and interstitial prominence.  Basilar atelectasis evident.  Persistent chronic bronchitic changes.  No new consolidation, definite pneumonia, or pneumothorax.  Small effusions noted.  IMPRESSION: Low volume exam with vascular congestion, basilar atelectasis and small effusions.  Minimal interval change.  Original Report Authenticated By: Judie Petit. Ruel Favors, M.D.   US Renal  01/26/2011  *RADIOLOGY REPORT*  Clinical Data:  Acute renal failure  RENAL/URINARY TRACT ULTRASOUND COMPLETE  Comparison:  01/26/2011  Findings: The patient is noted to have a trace ascites and bilateral pleural effusions.  Right Kidney:  Normal in  size.  Appears echogenic.  No evidence of mass or hydronephrosis. Mild perinephric fluid.  Left Kidney:  Normal in size. Appears echogenic. No evidence of mass or hydronephrosis. Mild perinephric fluid.  Bladder:  Appears normal for degree of bladder distention.  IMPRESSION: 1.  Bilateral echogenic kidneys without evidence for obstructive uropathy.  Original Report Authenticated By: Rosealee Albee, M.D.   Time spent with patient in doing this discharge is approximately one hour.   SignedEarlene Plater MD, Ladell Pier 01/29/2011, 11:27 AM

## 2011-01-29 NOTE — Progress Notes (Signed)
Pt  discharged to home d/c visit summary reviewed and pt capable of re verbalizing medications and follow up appointments. Pt remains stable. No signs and symptoms of distress. Educated to return to ER in the event of SOB, dizziness, chest pain, or fainting. Lawanna Cecere, RN    

## 2011-02-02 ENCOUNTER — Encounter (HOSPITAL_COMMUNITY): Payer: Self-pay | Admitting: Vascular Surgery

## 2011-02-02 NOTE — Consult Note (Signed)
I have seen and examined this patient and agree with the assessment/plan as outlined above by Cristal Ford MD. Zetta Bills K.,MD 02/02/2011 6:40 PM

## 2011-02-22 ENCOUNTER — Encounter: Payer: Self-pay | Admitting: Vascular Surgery

## 2011-02-23 ENCOUNTER — Encounter: Payer: Self-pay | Admitting: Vascular Surgery

## 2011-02-23 ENCOUNTER — Ambulatory Visit (INDEPENDENT_AMBULATORY_CARE_PROVIDER_SITE_OTHER): Payer: 59 | Admitting: Vascular Surgery

## 2011-02-23 VITALS — BP 131/74 | HR 67 | Resp 20 | Ht 65.0 in | Wt 158.0 lb

## 2011-02-23 DIAGNOSIS — N186 End stage renal disease: Secondary | ICD-10-CM | POA: Insufficient documentation

## 2011-02-23 NOTE — Progress Notes (Signed)
Subjective:     Patient ID: Jonathan Terry, male   DOB: 02-17-1955, 56 y.o.   MRN: [id number]  HPI this 56 year old male with end-stage renal disease is not yet on hemodialysis. He had a left radial cephalic AV fistula created  by me on January 17 which was unsuccessful and occluded in the PACU. He was returned to the operating room by Dr. Durwin Nora who performed a left upper arm brachial to cephalic AV fistula. This has functioned nicely. He denies pain or numbness in the left hand. Review of Systems     Objective:   Physical ExamBP 131/74  Pulse 67  Resp 20  Ht 5\' 5"  (1.651 m)  Wt 158 lb (71.668 kg)  BMI 26.29 kg/m2   left upper arm incision is well-healed. There is an excellent pulse and palpable thrill in the upper arm fistula. The distal wound proximal to the wrist is also healed nicely. No radial pulses palpable left hand is well perfused with good sensation and strength. Assessment:     Nicely functioning left brachial to cephalic A-V fistula-not yet on hemodialysis    Plan:     Return to see me on when necessary basis. Fistula could be utilized in mid April 2013 if necessary.

## 2011-03-13 ENCOUNTER — Inpatient Hospital Stay (HOSPITAL_COMMUNITY)
Admission: AD | Admit: 2011-03-13 | Discharge: 2011-03-20 | DRG: 682 | Disposition: A | Discharge status: Home / Self Care | Payer: Medicaid Other | Source: Other Acute Inpatient Hospital | Attending: Internal Medicine | Admitting: Internal Medicine

## 2011-03-13 DIAGNOSIS — N186 End stage renal disease: Secondary | ICD-10-CM | POA: Diagnosis present

## 2011-03-13 DIAGNOSIS — D638 Anemia in other chronic diseases classified elsewhere: Secondary | ICD-10-CM | POA: Diagnosis present

## 2011-03-13 DIAGNOSIS — I5021 Acute systolic (congestive) heart failure: Secondary | ICD-10-CM | POA: Diagnosis present

## 2011-03-13 DIAGNOSIS — I120 Hypertensive chronic kidney disease with stage 5 chronic kidney disease or end stage renal disease: Secondary | ICD-10-CM | POA: Diagnosis present

## 2011-03-13 DIAGNOSIS — I10 Essential (primary) hypertension: Secondary | ICD-10-CM | POA: Diagnosis present

## 2011-03-13 DIAGNOSIS — R197 Diarrhea, unspecified: Secondary | ICD-10-CM

## 2011-03-13 DIAGNOSIS — Z87891 Personal history of nicotine dependence: Secondary | ICD-10-CM

## 2011-03-13 DIAGNOSIS — I160 Hypertensive urgency: Secondary | ICD-10-CM

## 2011-03-13 DIAGNOSIS — D649 Anemia, unspecified: Secondary | ICD-10-CM

## 2011-03-13 DIAGNOSIS — I509 Heart failure, unspecified: Secondary | ICD-10-CM | POA: Diagnosis present

## 2011-03-13 DIAGNOSIS — I5042 Chronic combined systolic (congestive) and diastolic (congestive) heart failure: Secondary | ICD-10-CM | POA: Diagnosis present

## 2011-03-13 DIAGNOSIS — N179 Acute kidney failure, unspecified: Secondary | ICD-10-CM

## 2011-03-13 DIAGNOSIS — E872 Acidosis: Secondary | ICD-10-CM

## 2011-03-13 DIAGNOSIS — N189 Chronic kidney disease, unspecified: Secondary | ICD-10-CM

## 2011-03-13 DIAGNOSIS — Z992 Dependence on renal dialysis: Secondary | ICD-10-CM

## 2011-03-13 MED ORDER — SODIUM CHLORIDE 0.9 % IJ SOLN: 3.0000 mL | INTRAMUSCULAR | Status: DC | PRN

## 2011-03-13 MED ORDER — SODIUM CHLORIDE 0.9 % IJ SOLN
3.0000 mL | Freq: Two times a day (BID) | INTRAMUSCULAR | Status: DC
  Administered 2011-03-14 (×2): 3 mL via INTRAVENOUS

## 2011-03-13 MED ORDER — HYDRALAZINE HCL 20 MG/ML IJ SOLN
10.0000 mg | INTRAMUSCULAR | Status: DC | PRN
  Filled 2011-03-13: qty 0.5

## 2011-03-13 MED ORDER — HEPARIN SODIUM (PORCINE) 5000 UNIT/ML IJ SOLN
5000.0000 [IU] | Freq: Three times a day (TID) | INTRAMUSCULAR | Status: DC
  Administered 2011-03-14 – 2011-03-19 (×16): 5000 [IU] via SUBCUTANEOUS
  Filled 2011-03-13 (×23): qty 1

## 2011-03-13 MED ORDER — SODIUM CHLORIDE 0.9 % IV SOLN: 250.0000 mL | INTRAVENOUS | Status: DC | PRN

## 2011-03-13 MED ORDER — ONDANSETRON HCL 4 MG/2ML IJ SOLN: 4.0000 mg | Freq: Four times a day (QID) | INTRAMUSCULAR | Status: DC | PRN

## 2011-03-13 MED ORDER — ALPRAZOLAM 0.25 MG PO TABS
0.2500 mg | ORAL_TABLET | Freq: Two times a day (BID) | ORAL | Status: DC | PRN
  Administered 2011-03-14: 0.25 mg via ORAL
  Filled 2011-03-13: qty 1

## 2011-03-13 MED ORDER — ASPIRIN EC 81 MG PO TBEC
81.0000 mg | DELAYED_RELEASE_TABLET | Freq: Every day | ORAL | Status: DC
  Administered 2011-03-14 – 2011-03-20 (×6): 81 mg via ORAL
  Filled 2011-03-13 (×9): qty 1

## 2011-03-13 MED ORDER — CALCITRIOL 0.25 MCG PO CAPS
0.2500 ug | ORAL_CAPSULE | Freq: Every day | ORAL | Status: DC
  Administered 2011-03-14 – 2011-03-16 (×2): 0.25 ug via ORAL
  Filled 2011-03-13 (×3): qty 1

## 2011-03-13 MED ORDER — AMLODIPINE BESYLATE 5 MG PO TABS
5.0000 mg | ORAL_TABLET | Freq: Every day | ORAL | Status: DC
  Administered 2011-03-14 – 2011-03-19 (×5): 5 mg via ORAL
  Filled 2011-03-13 (×7): qty 1

## 2011-03-13 MED ORDER — ACETAMINOPHEN 325 MG PO TABS: 650.0000 mg | ORAL_TABLET | ORAL | Status: DC | PRN

## 2011-03-13 MED ORDER — CALCIUM CARBONATE ANTACID 500 MG PO CHEW
1.0000 | CHEWABLE_TABLET | Freq: Three times a day (TID) | ORAL | Status: DC
  Administered 2011-03-14 – 2011-03-20 (×16): 200 mg via ORAL
  Filled 2011-03-13 (×24): qty 1

## 2011-03-13 MED ORDER — FUROSEMIDE 10 MG/ML IJ SOLN
80.0000 mg | Freq: Two times a day (BID) | INTRAMUSCULAR | Status: DC
  Administered 2011-03-14: 80 mg via INTRAVENOUS
  Filled 2011-03-13 (×4): qty 8

## 2011-03-13 MED ORDER — METOPROLOL TARTRATE 50 MG PO TABS
50.0000 mg | ORAL_TABLET | Freq: Two times a day (BID) | ORAL | Status: DC
  Administered 2011-03-14 – 2011-03-20 (×14): 50 mg via ORAL
  Filled 2011-03-13 (×16): qty 1

## 2011-03-13 NOTE — Progress Notes (Signed)
Admitting MD and Renal MD are aware of pt s' arrival.Pt is presently resting comfortable in bed. No SOB or respiratory distress noted or reported. Pt was informed that MD will be here to admit him. Rx was  Also notified for medication rec.

## 2011-03-13 NOTE — H&P (Signed)
PCP:  none   Chief Complaint:  swelling HPI: Jonathan Terry is a 56 y.o. male   has a past medical history of Hypertension; Renal failure (ARF), acute on chronic; Congestive heart failure (01/29/2011); and Anemia (01/29/2011).   Transferred from [facility] with acute on Chronic renal failure with possible need for HD. Was here in January and was noted to have CKD with Cr 9.5 had Left arm fistula placed in January he was supposed to start HD in April. [facility] with generalized swelling and SOB his Cr was found to be elevated to 11.7 with K of 4.7 and he was transferred to [facility] for possible dialysis. Patient does endorse occasional chest pain but not currently. Chest pain feels like knife. En route he received 40 IV of lasix with some urine output. He since had some improvement.   He has no known past medical history because he never went to the doctor. While he was here in January he was diagnosed with HTN and systolic CHF.   Dr Arlean Hopping is aware of the patient and recommended for medicine to admit, give him IV Lasix and he will see patient in AM.    Review of Systems:    Pertinent positives include:shortness of breath at rest. dyspnea on exertion, chest pain, occasional  nausea, vomiting, headache Bilateral lower extremity swelling   Constitutional:  No weight loss, night sweats, Fevers, chills, fatigue, weight loss  HEENT:  No headaches, Difficulty swallowing,Tooth/dental problems,Sore throat,  No sneezing, itching, ear ache, nasal congestion, post nasal drip,  Cardio-vascular:  No Orthopnea, PND, anasarca, dizziness, palpitations.no  GI:  No heartburn, indigestion, abdominal pain,diarrhea, change in bowel habits, loss of appetite, melena, blood in stool, hematoemesis Resp:   No excess mucus, no productive cough, No non-productive cough, No coughing up of blood.No change in color of mucus.No wheezing. Skin:  no rash or lesions. No jaundice GU:  no dysuria,  change in color of urine, no urgency or frequency. No straining to urinate.  No flank pain.  Musculoskeletal:  No joint pain or no joint swelling. No decreased range of motion. No back pain.  Psych:  No change in mood or affect. No depression or anxiety. No memory loss.  Neuro: no localizing neurological complaints, no tingling, no weakness, no double vision, no gait abnormality, no slurred speech, no confusion  Otherwise ROS are negative except for above, 10 systems were reviewed  Past Medical History: Past Medical History  Diagnosis Date  . Hypertension   . Renal failure (ARF), acute on chronic   . Congestive heart failure 01/29/2011  . Anemia 01/29/2011   Past Surgical History  Procedure Date  . Av fistula placement 01/28/2011    Procedure: ARTERIOVENOUS (AV) FISTULA CREATION;  Surgeon: Pryor Ochoa, MD;  Location: [facility] OR;  Service: Vascular;  Laterality: Left;  . Av fistula placement 01/28/2011    Procedure: ARTERIOVENOUS (AV) FISTULA CREATION;  Surgeon: Chuck Hint, MD;  Location: [facility] OR;  Service: Vascular;;     Medications: Prior to Admission medications   Medication Sig Start Date End Date Taking? Authorizing Provider  amLODipine (NORVASC) 5 MG tablet Take 5 mg by mouth at bedtime. 01/29/11 01/29/12 Yes Novlet Wandra Mannan, MD  calcitRIOL (ROCALTROL) 0.25 MCG capsule Take 0.25 mcg by mouth daily. 01/29/11 01/29/12 Yes Novlet Wandra Mannan, MD  calcium carbonate (TUMS - DOSED IN MG ELEMENTAL CALCIUM) 500 MG chewable tablet Chew 1 tablet by mouth 3 (three) times daily. 01/29/11 01/29/12 Yes Novlet  Wandra Mannan, MD  furosemide (LASIX) 40 MG tablet Take 40 mg by mouth daily. 01/29/11 01/29/12 Yes Nov[place] Wandra Mannan, MD  metoprolol (LOPRESSOR) 50 MG tablet Take 50 mg by mouth 2 (two) times daily. 01/29/11 01/29/12 Yes Nov[place] Wandra Mannan, MD  Vitamin D, Ergocalciferol, (DRISDOL) 50000 UNITS CAPS Take 50,000 Units by mouth every 7 (seven) days. 01/29/11  Yes Novlet Wandra Mannan,  MD    Allergies:  No Known Allergies  Social History:  Ambulatory independently Lives at home with girlfriend   reports that he has quit smoking. His smoking use included Cigarettes. He quit after 35 years of use. He quit smokeless tobacco use about 3 years ago. He reports that he drinks about 1.2 ounces of alcohol per week. He reports that he does not use illicit drugs.   Family History: family history includes Kidney disease in his father.    Physical Exam: Patient Vitals for the past 24 hrs:  BP Temp Temp src Pulse Resp SpO2 Height Weight  03/13/11 2049 136/71 mmHg 98 F (36.7 C) Oral 75  20  97 % 5\' 5"  (1.651 m) 79.561 kg (175 lb 6.4 oz)    1. General:  in No Acute distress 2. Psychological: Alert and Oriented 3. Head/ENT:   Moist Mucous Membranes                          Head Non traumatic, neck supple                          Normal Dentition 4. SKIN: normal Skin turgor,  Skin clean Dry and intact no rash 5. Heart: Regular rate and rhythm no Murmur, Rub or gallop 6. Lungs: no wheezes or crackles  But dull air sounds at the bases bilateraly 7. Abdomen: Soft, non-tender, Non distended 8. Lower extremities: no clubbing, cyanosis, 2+ edema, also some edema of the sacral region 9. Neurologically Grossly intact, moving all 4 extremities equally 10. MSK: Normal range of motion  body mass index is 29.19 kg/(m^2).   Labs on Admission:  No results found for this basename: NA:2,K:2,CL:2,CO2:2,GLUCOSE:2,BUN:2,CREATININE:2,CALCIUM:2,MG:2,PHOS:2 in the last 72 hours No results found for this basename: AST:2,ALT:2,ALKPHOS:2,BILITOT:2,PROT:2,ALBUMIN:2 in the last 72 hours No results found for this basename: LIPASE:2,AMYLASE:2 in the last 72 hours No results found for this basename: WBC:2,NEUTROABS:2,HGB:2,HCT:2,MCV:2,PLT:2 in the last 72 hours No results found for this basename: CKTOTAL:3,CKMB:3,CKMBINDEX:3,TROPONINI:3 in the last 72 hours No results found for this basename:  TSH,T4TOTAL,FREET3,T3FREE,THYROIDAB in the last 72 hours No results found for this basename: VITAMINB12:2,FOLATE:2,FERRITIN:2,TIBC:2,IRON:2,RETICCTPCT:2 in the last 72 hours Lab Results  Component Value Date   HGBA1C 5.3 01/25/2011    Estimated Creatinine Clearance: 8.5 ml/min (by C-G formula based on Cr of 9.18). ABG No results found for this basename: phart, pco2, po2, hco3, tco2, acidbasedef, o2sat     No results found for this basename: DDIMER     Other results: Ck 284 mb 5.0 trop 0.03  Alb 2.6  I have pearsonaly reviewed this: ECG REPORT  Rate: 70   Rhythm:NSR  ST&T Change: T wave inv in I, II, III, aVF V4, V5, V6  UA WBC 5-10 but RBC 10-20 BNP 41700  Cultures:    Component Value Date/Time   SDES STOOL 01/26/2011 1057   SPECREQUEST NONE 01/26/2011 1057   CULT NO SALMONELLA, SHIGELLA, CAMPYLOBACTER, OR YERSINIA ISOLATED 01/26/2011 1057   REPTSTATUS 01/30/2011 FINAL 01/26/2011 1057       Radiological  Exams on Admission: CXR from [facility]:  Pleural effusions bilaterally with diminished airrations at the bases.   Assessment/Plan  56 yo with Hx of CKD and CHF here with fluid overload partially improved by lasix. Acute on chronic renal failure and chest pain.   Present on Admission:  .Renal failure (ARF), acute on chronic with evidence of fluid overload but still making urine and responded somewhat to lasix. Will give Lasix 80 mg IV and monitor urine output. Currently in no respiratory distress comfortable on 2 L  .Congestive heart failure - - admit on telemetry, cycle cardiac enzymes, obtain serial ECG, to evaluate for ischemia as a cause of heart failure  monitor daily weight  diurese with IV lasix and monitor orthostatics avoid over diuresis. Echogram done in January showed 45% EF  ACE/ARBi contraindicated given CKD Consider cardiology consult if no improvement or evidence of ischemia  .Anemia - chronic, likely chronic disease anemia .Hypertension  - continue  home medications, prn hydralazine .End stage renal disease - His fistula was supposed to be used in April will see if he needs HD during this hospitalization or if he improves with some lasix Chest pain - may be fluid overload related but will cycle CE Abnormal CXR - will repeat in AM to see if any evidence of infection once he is diuresed Prophylaxis: hep [place]   CODE STATUS: FULL CODE   Leo Weyandt 03/13/2011, 10:13 PM

## 2011-03-14 ENCOUNTER — Inpatient Hospital Stay (HOSPITAL_COMMUNITY): Payer: Medicaid Other

## 2011-03-14 LAB — URINALYSIS, ROUTINE W REFLEX MICROSCOPIC
Bilirubin Urine: NEGATIVE
Protein, ur: >300 mg/dL — AB
Urobilinogen, UA: 0.2 mg/dL (ref 0.0–1.0)

## 2011-03-14 LAB — CARDIAC PANEL(CRET KIN+CKTOT+MB+TROPI)
CK, MB: 4.8 ng/mL — ABNORMAL HIGH (ref 0.3–4.0)
CK, MB: 5.8 ng/mL — ABNORMAL HIGH (ref 0.3–4.0)
Relative Index: 1.6 (ref 0.0–2.5)
Relative Index: 1.8 (ref 0.0–2.5)
Total CK: 330 U/L — ABNORMAL HIGH (ref 7–232)
Troponin I: <0.3 ng/mL (ref <0.30)
Troponin I: <0.3 ng/mL (ref <0.30)

## 2011-03-14 LAB — CREATININE, SERUM
Creatinine, Ser: 12.29 mg/dL — ABNORMAL HIGH (ref 0.50–1.35)
GFR calc non Af Amer: 4 mL/min — ABNORMAL LOW (ref >90)

## 2011-03-14 LAB — BASIC METABOLIC PANEL
BUN: 69 mg/dL — ABNORMAL HIGH (ref 6–23)
Chloride: 101 mEq/L (ref 96–112)
GFR calc Af Amer: 5 mL/min — ABNORMAL LOW (ref >90)
GFR calc non Af Amer: 4 mL/min — ABNORMAL LOW (ref >90)
Potassium: 4.2 mEq/L (ref 3.5–5.1)
Sodium: 140 mEq/L (ref 135–145)

## 2011-03-14 LAB — CBC
Hemoglobin: 9.6 g/dL — ABNORMAL LOW (ref 13.0–17.0)
MCH: 27.3 pg (ref 26.0–34.0)
Platelets: 270 10*3/uL (ref 150–400)
RBC: 3.52 MIL/uL — ABNORMAL LOW (ref 4.22–5.81)

## 2011-03-14 LAB — URINE MICROSCOPIC-ADD ON

## 2011-03-14 LAB — TSH: TSH: 4.305 u[IU]/mL (ref 0.350–4.500)

## 2011-03-14 MED ORDER — FUROSEMIDE 10 MG/ML IJ SOLN
120.0000 mg | Freq: Three times a day (TID) | INTRAVENOUS | Status: DC
  Administered 2011-03-14 (×3): 120 mg via INTRAVENOUS
  Filled 2011-03-14 (×5): qty 12

## 2011-03-14 NOTE — Progress Notes (Signed)
Subjective: Patient admitted with worsening edema,   Objective: Vital signs in last 24 hours: Temp:  [98 F (36.7 C)-98.9 F (37.2 C)] 98.9 F (37.2 C) (03/03 1320) Pulse Rate:  [67-76] 67  (03/03 1320) Resp:  [18-20] 18  (03/03 1320) BP: (122-153)/(69-79) 153/76 mmHg (03/03 1320) SpO2:  [94 %-100 %] 100 % (03/03 1320) Weight:  [79.561 kg (175 lb 6.4 oz)] 79.561 kg (175 lb 6.4 oz) (03/02 2049) Weight change:  Last BM Date: 03/13/11  Intake/Output from previous day: 03/02 0701 - 03/03 0700 In: 888 [P.O.:880; IV Piggyback:8] Out: 275 [Urine:275]     Physical Exam: HEENT: Atraumatic, normocephalic Neck: Supple Chest : Clear to auscultation bilaterally, no wheezing, no crackles Heart : S1 S2 Regular, no murmurs Abdomen: Soft, Nontender, no organomegaly Ext : No cyanosis, clubbing, edema   Lab Results: Basic Metabolic Panel:  Basename 03/14/11 0620 03/13/11 2332  NA 140 --  K 4.2 --  CL 101 --  CO2 28 --  GLUCOSE 77 --  BUN 69* --  CREATININE 12.16* 12.29*  CALCIUM 7.3* --  MG -- --  PHOS -- --    CBC:  Basename 03/13/11 2332  WBC 9.8  NEUTROABS --  HGB 9.6*  HCT 30.4*  MCV 86.4  PLT 270   Cardiac Enzymes:  Basename 03/14/11 1151 03/14/11 0620 03/13/11 2331  CKTOTAL 303* 300* 330*  CKMB 4.8* 5.0* 5.8*  CKMBINDEX -- -- --  TROPONINI <0.30 <0.30 <0.30   Thyroid Function Tests:  Basename 03/14/11 0620  TSH 4.305  T4TOTAL --  FREET4 --  T3FREE --  THYROIDAB --   Anemia Panel: No results found for this basename: VITAMINB12,FOLATE,FERRITIN,TIBC,IRON,RETICCTPCT in the last 72 hours Coagulation: No results found for this basename: LABPROT:2,INR:2 in the last 72 hours Urine Drug Screen: Drugs of Abuse  No results found for this basename: labopia, cocainscrnur, labbenz, amphetmu, thcu, labbarb    Alcohol Level: No results found for this basename: ETH:2 in the last 72 hours Urinalysis:  Basename 03/14/11 0540  COLORURINE YELLOW  LABSPEC 1.018    PHURINE 7.5  GLUCOSEU 250*  HGBUR SMALL*  BILIRUBINUR NEGATIVE  KETONESUR NEGATIVE  PROTEINUR >300*  UROBILINOGEN 0.2  NITRITE NEGATIVE  LEUKOCYTESUR NEGATIVE    Studies/Results: Dg Chest 2 View  03/14/2011  *RADIOLOGY REPORT*  Clinical Data: Chest pain.  Swelling in the legs.  Pain.  Shortness of breath.  History of hypertension, CHF.  CHEST - 2 VIEW  Comparison: 03/13/2011  Findings: Heart is enlarged.  There are bilateral lower lobe opacities and pleural effusions.  There is perihilar pulmonary edema.  Bilateral opacities may be related to confluent edema and/or infectious process.  IMPRESSION:  1.  Cardiomegaly and edema. 2.  Bilateral lower lobe opacities and effusions.  Original Report Authenticated By: Patterson Hammersmith, M.D.    Medications: Scheduled Meds:   . amLODipine  5 mg Oral QHS  . aspirin EC  81 mg Oral Daily  . calcitRIOL  0.25 mcg Oral Daily  . calcium carbonate  1 tablet Oral TID WC  . furosemide  120 mg Intravenous TID  . heparin  5,000 Units Subcutaneous Q8H  . metoprolol  50 mg Oral BID  . sodium chloride  3 mL Intravenous Q12H  . DISCONTD: furosemide  80 mg Intravenous BID   Continuous Infusions:  PRN Meds:.sodium chloride, acetaminophen, ALPRAZolam, hydrALAZINE, ondansetron (ZOFRAN) IV, sodium chloride  Assessment/Plan:  Principal Problem:  *Renal failure (ARF), acute on chronic Continue lasix 120 mg TID If no response  then HD as per renal  Congestive heart failure Cardiac enzymes x 3 are negative. Continue IV lasix. EF 45%   Anemia Chronic.  Hypertension Continue metoprolol.     LOS: 1 day   [facility] Pager: [phone] 03/14/2011, 2:28 PM

## 2011-03-14 NOTE — Progress Notes (Signed)
States, "I can't breathe." Respirations labored. [id number]. O2 sat. 98% on 2L. Benedetto Coons NP notified.

## 2011-03-14 NOTE — Consult Note (Signed)
Jonathan Terry 03/14/2011 Jonathan Terry D Requesting Physician:  Dr. Sharl Ma  Reason for Consult:  CKD patient with worsening edema and increasing creatinine HPI: The patient is a 56 y.o. year-old transferred from [facility] with acute on Chronic renal failure with possible need for HD. Was here in January and was noted to have CKD with Cr 9.5 had Left arm fistula placed in January he was supposed to start HD in April. Presented to [facility] with generalized swelling and SOB his Cr was found to be elevated to 11.7 with K of 4.7.   Patient denies any SOB.  The ED doctor at [facility] said CXR was borderline for CHF and recommended admission. Patient admitted.  He main complaint is swelling.  Occassional am nausea, no vomiting, eating good, no severe fatigue.    Creatinine, Ser  Date/Time Value Range Status  03/14/2011  6:20 AM 12.16* 0.50-1.35 (mg/dL) Final  4/0/9811 91:47 PM 12.29* 0.50-1.35 (mg/dL) Final  09/09/5619  3:08 AM 9.18* 0.50-1.35 (mg/dL) Final  6/57/8469  6:29 AM 8.97* 0.50-1.35 (mg/dL) Final  06/08/4130  4:40 AM 8.84* 0.50-1.35 (mg/dL) Final  01/13/7251  6:64 AM 8.80* 0.50-1.35 (mg/dL) Final  04/13/4740  5:95 AM 8.75* 0.50-1.35 (mg/dL) Final  6/38/7564  3:32 PM 9.20* 0.50-1.35 (mg/dL) Final  9/51/8841  6:60 PM 9.08* 0.50-1.35 (mg/dL) Final    Past Medical History:  Past Medical History  Diagnosis Date  . Hypertension   . Renal failure (ARF), acute on chronic   . Congestive heart failure 01/29/2011  . Anemia 01/29/2011    Past Surgical History:  Past Surgical History  Procedure Date  . Av fistula placement 01/28/2011    Procedure: ARTERIOVENOUS (AV) FISTULA CREATION;  Surgeon: Pryor Ochoa, MD;  Location: [facility] OR;  Service: Vascular;  Laterality: Left;  . Av fistula placement 01/28/2011    Procedure: ARTERIOVENOUS (AV) FISTULA CREATION;  Surgeon: Chuck Hint, MD;  Location: [facility] OR;  Service: Vascular;;    Family History:  Family History  Problem Relation  Age of Onset  . Kidney disease Father    Social History:  reports that he has quit smoking. His smoking use included Cigarettes. He quit after 35 years of use. He quit smokeless tobacco use about 3 years ago. He reports that he drinks about 1.2 ounces of alcohol per week. He reports that he does not use illicit drugs.  Allergies: No Known Allergies  Home medications: Prior to Admission medications   Medication Sig Start Date End Date Taking? Authorizing Provider  amLODipine (NORVASC) 5 MG tablet Take 5 mg by mouth at bedtime. 01/29/11 01/29/12 Yes Novlet Wandra Mannan, MD  calcitRIOL (ROCALTROL) 0.25 MCG capsule Take 0.25 mcg by mouth daily. 01/29/11 01/29/12 Yes Novlet Wandra Mannan, MD  calcium carbonate (TUMS - DOSED IN MG ELEMENTAL CALCIUM) 500 MG chewable tablet Chew 1 tablet by mouth 3 (three) times daily. 01/29/11 01/29/12 Yes Novlet Wandra Mannan, MD  furosemide (LASIX) 40 MG tablet Take 40 mg by mouth daily. 01/29/11 01/29/12 Yes Novlet Wandra Mannan, MD  metoprolol (LOPRESSOR) 50 MG tablet Take 50 mg by mouth 2 (two) times daily. 01/29/11 01/29/12 Yes Novlet Wandra Mannan, MD  Vitamin D, Ergocalciferol, (DRISDOL) 50000 UNITS CAPS Take 50,000 Units by mouth every 7 (seven) days. 01/29/11  Yes Novlet Wandra Mannan, MD    Inpatient medications:    . amLODipine  5 mg Oral QHS  . aspirin EC  81 mg Oral Daily  . calcitRIOL  0.25 mcg Oral Daily  . calcium carbonate  1 tablet Oral TID WC  . furosemide  80 mg Intravenous BID  . heparin  5,000 Units Subcutaneous Q8H  . metoprolol  50 mg Oral BID  . sodium chloride  3 mL Intravenous Q12H    Review of Systems Gen:  Denies headache, fever, chills, sweats.  No weight loss. HEENT:  No visual change, sore throat, difficulty swallowing. Resp:  No difficulty breathing, + DOE.  No cough or hemoptysis. Cardiac:  No chest pain, orthopnea, PND.  + edema legs and arms GI:   Denies abdominal pain.   No nausea, vomiting, diarrhea.  No constipation. GU:   Denies difficulty or change in voiding.  No change in urine color.     MS:  Denies joint pain or swelling.   Derm:  Denies skin rash or itching.  No chronic skin conditions.  Neuro:   Denies focal weakness, memory problems, hx stroke or TIA.   Psych:  Denies symptoms of depression of anxiety.  No hallucination.    Physical Exam:  Blood pressure 151/79, pulse 76, temperature 98.5 F (36.9 C), temperature source Oral, resp. rate 18, height 5\' 5"  (1.651 m), weight 79.561 kg (175 lb 6.4 oz), SpO2 100.00%.  Gen: alert, no distress , calm Skin: no rash, cyanosis Neck: no JVD, bruits or LAN Chest: CTA bilat, no rales or wheezing Heart: regular, no rub or gallop Abdomen: soft, nontender, nondistended Ext: 1+ pitting edema of legs, mild edema of UE's also. Good looking AVF of left arm, maturing well.  Neuro: alert, Ox3, no focal deficit Heme/Lymph: no bruising or LAN  Labs: Basic Metabolic Panel:  Lab 03/14/11 1610 03/13/11 2332  NA 140 --  K 4.2 --  CL 101 --  CO2 28 --  GLUCOSE 77 --  BUN 69* --  CREATININE 12.16* 12.29*  ALB -- --  CALCIUM 7.3* --  PHOS -- --   Liver Function Tests: No results found for this basename: AST:3,ALT:3,ALKPHOS:3,BILITOT:3,PROT:3,ALBUMIN:3 in the last 168 hours No results found for this basename: LIPASE:3,AMYLASE:3 in the last 168 hours No results found for this basename: AMMONIA:3 in the last 168 hours CBC:  Lab 03/13/11 2332  WBC 9.8  NEUTROABS --  HGB 9.6*  HCT 30.4*  MCV 86.4  PLT 270   PT/INR: @labrcntip (inr:5) Cardiac Enzymes:  Lab 03/14/11 0620 03/13/11 2331  CKTOTAL 300* 330*  CKMB 5.0* 5.8*  CKMBINDEX -- --  TROPONINI <0.30 <0.30   CBG: No results found for this basename: GLUCAP:5 in the last 168 hours  Iron Studies: No results found for this basename: IRON:30,TIBC:30,TRANSFERRIN:30,FERRITIN:30 in the last 168 hours  Xrays/Other Studies: No results found.  Assessment/Plan 1. CKD, advanced stage V - pt not overtly uremic,  has volume excess, no CHF on exam.  Recommend IV lasix, check pa/lat CXR and if he doesn't diurese then may need to start dialysis.  He is on a relatively low dose of lasix at home, 40 mg/day recorded on home med list, and this can certainly be increased.   2. S/P AVF left arm on Jan 17th- not ready to use, but is maturing well.  3. 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 Anemia- Hb 9's  Thanks for calling, will follow.   Vinson Moselle  MD [employer] [phone] pgr    [phone] cell 03/14/2011, 9:41 AM

## 2011-03-15 ENCOUNTER — Inpatient Hospital Stay (HOSPITAL_COMMUNITY): Payer: Medicaid Other

## 2011-03-15 ENCOUNTER — Encounter (HOSPITAL_COMMUNITY): Payer: Self-pay | Admitting: Radiology

## 2011-03-15 LAB — RENAL FUNCTION PANEL
BUN: 73 mg/dL — ABNORMAL HIGH (ref 6–23)
Calcium: 7.7 mg/dL — ABNORMAL LOW (ref 8.4–10.5)
Glucose, Bld: 84 mg/dL (ref 70–99)
Phosphorus: 7 mg/dL — ABNORMAL HIGH (ref 2.3–4.6)

## 2011-03-15 LAB — PROTIME-INR: Prothrombin Time: 14.2 seconds (ref 11.6–15.2)

## 2011-03-15 LAB — URINE CULTURE: Culture: NO GROWTH

## 2011-03-15 LAB — CBC
HCT: 31 % — ABNORMAL LOW (ref 39.0–52.0)
MCV: 86.4 fL (ref 78.0–100.0)
RDW: 13.3 % (ref 11.5–15.5)
WBC: 9.6 10*3/uL (ref 4.0–10.5)

## 2011-03-15 MED ORDER — SODIUM CHLORIDE 0.9 % IV SOLN: 100.0000 mL | INTRAVENOUS | Status: DC | PRN

## 2011-03-15 MED ORDER — HEPARIN SODIUM (PORCINE) 1000 UNIT/ML DIALYSIS
20.0000 [IU]/kg | INTRAMUSCULAR | Status: DC | PRN
  Filled 2011-03-15: qty 2

## 2011-03-15 MED ORDER — PENTAFLUOROPROP-TETRAFLUOROETH EX AERO: 1.0000 "application " | INHALATION_SPRAY | CUTANEOUS | Status: DC | PRN

## 2011-03-15 MED ORDER — NITROGLYCERIN 0.4 MG/HR TD PT24
0.4000 mg | MEDICATED_PATCH | Freq: Every day | TRANSDERMAL | Status: DC
  Administered 2011-03-15 – 2011-03-17 (×4): 0.4 mg via TRANSDERMAL
  Filled 2011-03-15 (×4): qty 1

## 2011-03-15 MED ORDER — HEPARIN SODIUM (PORCINE) 1000 UNIT/ML IJ SOLN
INTRAMUSCULAR | Status: AC
  Filled 2011-03-15: qty 1

## 2011-03-15 MED ORDER — LIDOCAINE-PRILOCAINE 2.5-2.5 % EX CREA
1.0000 "application " | TOPICAL_CREAM | CUTANEOUS | Status: DC | PRN
  Filled 2011-03-15: qty 5

## 2011-03-15 MED ORDER — FUROSEMIDE 10 MG/ML IJ SOLN
160.0000 mg | Freq: Three times a day (TID) | INTRAVENOUS | Status: DC
  Administered 2011-03-15 – 2011-03-16 (×4): 160 mg via INTRAVENOUS
  Filled 2011-03-15 (×8): qty 16

## 2011-03-15 MED ORDER — NEPRO/CARBSTEADY PO LIQD
237.0000 mL | ORAL | Status: DC | PRN
  Filled 2011-03-15: qty 237

## 2011-03-15 MED ORDER — CEFAZOLIN SODIUM 1-5 GM-% IV SOLN
1.0000 g | INTRAVENOUS | Status: AC
  Administered 2011-03-15: 1 g via INTRAVENOUS
  Filled 2011-03-15: qty 50

## 2011-03-15 MED ORDER — LIDOCAINE HCL (PF) 1 % IJ SOLN: 5.0000 mL | INTRAMUSCULAR | Status: DC | PRN

## 2011-03-15 MED ORDER — HEPARIN SODIUM (PORCINE) 1000 UNIT/ML DIALYSIS
20.0000 [IU]/kg | INTRAMUSCULAR | Status: DC | PRN
  Administered 2011-03-15: 1600 [IU] via INTRAVENOUS_CENTRAL
  Filled 2011-03-15: qty 2

## 2011-03-15 MED ORDER — FENTANYL CITRATE 0.05 MG/ML IJ SOLN
INTRAMUSCULAR | Status: AC | PRN
  Administered 2011-03-15 (×2): 50 ug via INTRAVENOUS

## 2011-03-15 MED ORDER — GELATIN ABSORBABLE 12-7 MM EX MISC
CUTANEOUS | Status: AC
  Filled 2011-03-15: qty 1

## 2011-03-15 MED ORDER — HEPARIN SODIUM (PORCINE) 1000 UNIT/ML DIALYSIS
1000.0000 [IU] | INTRAMUSCULAR | Status: DC | PRN
  Filled 2011-03-15: qty 1

## 2011-03-15 MED ORDER — METOLAZONE 5 MG PO TABS
5.0000 mg | ORAL_TABLET | Freq: Two times a day (BID) | ORAL | Status: DC
  Administered 2011-03-16: 5 mg via ORAL
  Filled 2011-03-15 (×5): qty 1

## 2011-03-15 MED ORDER — DEXTROSE 5 % IV SOLN
160.0000 mg | Freq: Three times a day (TID) | INTRAVENOUS | Status: DC
  Filled 2011-03-15: qty 16

## 2011-03-15 MED ORDER — MIDAZOLAM HCL 5 MG/5ML IJ SOLN
INTRAMUSCULAR | Status: AC | PRN
  Administered 2011-03-15: 2 mg via INTRAVENOUS

## 2011-03-15 MED ORDER — ALTEPLASE 2 MG IJ SOLR
2.0000 mg | Freq: Once | INTRAMUSCULAR | Status: AC | PRN
  Filled 2011-03-15: qty 2

## 2011-03-15 NOTE — Progress Notes (Signed)
   CARE MANAGEMENT NOTE 03/15/2011  Patient:  Jonathan Terry, Jonathan Terry   Account Number:  [id number]  Date Initiated:  03/15/2011  Documentation initiated by:  Junius Creamer  Subjective/Objective Assessment:   adm w renal failure     Action/Plan:   lives w friend   Anticipated DC Date:  03/17/2011   Anticipated DC Plan:  HOME/SELF CARE      DC Planning Services  CM consult  Follow-up appt scheduled      Choice offered to / List presented to:             Status of service:   Medicare Important Message given?   (If response is "NO", the following Medicare IM given date fields will be blank) Date Medicare IM given:   Date Additional Medicare IM given:    Discharge Disposition:  HOME/SELF CARE  Per UR Regulation:    Comments:  3/4 has appt w fam medicine at [facility] clinic on 3-13 at 2pm. debbie Gregg Holster rn,bsn [phone]

## 2011-03-15 NOTE — H&P (Signed)
Jonathan Terry is an 56 y.o. male.   Chief Complaint: ESRD; acute on chronic renal failure HPI: SOB; edema L AVF placed 01/28/11-not mature yet Scheduled for tunneled hemodialysis catheter placement in IR  Past Medical History  Diagnosis Date  . Hypertension   . Renal failure (ARF), acute on chronic   . Congestive heart failure 01/29/2011  . Anemia 01/29/2011    Past Surgical History  Procedure Date  . Av fistula placement 01/28/2011    Procedure: ARTERIOVENOUS (AV) FISTULA CREATION;  Surgeon: Pryor Ochoa, MD;  Location: [facility] OR;  Service: Vascular;  Laterality: Left;  . Av fistula placement 01/28/2011    Procedure: ARTERIOVENOUS (AV) FISTULA CREATION;  Surgeon: Chuck Hint, MD;  Location: [facility] OR;  Service: Vascular;;    Family History  Problem Relation Age of Onset  . Kidney disease Father    Social History:  reports that he has quit smoking. His smoking use included Cigarettes. He quit after 35 years of use. He quit smokeless tobacco use about 3 years ago. He reports that he drinks about 1.2 ounces of alcohol per week. He reports that he does not use illicit drugs.  Allergies: No Known Allergies  Medications Prior to Admission  Medication Dose Route Frequency Provider Last Rate Last Dose  . 0.9 %  sodium chloride infusion  100 mL Intravenous PRN Maree Krabbe, MD      . 0.9 %  sodium chloride infusion  100 mL Intravenous PRN Maree Krabbe, MD      . acetaminophen (TYLENOL) tablet 650 mg  650 mg Oral Q4H PRN Therisa Doyne, MD      . ALPRAZolam Prudy Feeler) tablet 0.25 mg  0.25 mg Oral BID PRN Therisa Doyne, MD   0.25 mg at 03/14/11 2350  . alteplase (CATHFLO ACTIVASE) injection 2 mg  2 mg Intracatheter Once PRN Maree Krabbe, MD      . amLODipine (NORVASC) tablet 5 mg  5 mg Oral QHS Therisa Doyne, MD   5 mg at 03/14/11 2305  . aspirin EC tablet 81 mg  81 mg Oral Daily Therisa Doyne, MD   81 mg at 03/14/11 1049  . calcitRIOL (ROCALTROL) capsule  0.25 mcg  0.25 mcg Oral Daily Therisa Doyne, MD   0.25 mcg at 03/14/11 1049  . calcium carbonate (TUMS - dosed in mg elemental calcium) chewable tablet 200 mg of elemental calcium  1 tablet Oral TID WC Therisa Doyne, MD   200 mg of elemental calcium at 03/14/11 1715  . feeding supplement (NEPRO CARB STEADY) liquid 237 mL  237 mL Oral PRN Maree Krabbe, MD      . furosemide (LASIX) 160 mg in dextrose 5 % 50 mL IVPB  160 mg Intravenous TID Meredeth Ide, MD   160 mg at 03/15/11 0423  . heparin injection 1,000 Units  1,000 Units Dialysis PRN Maree Krabbe, MD      . heparin injection 1,600 Units  20 Units/kg Dialysis PRN Maree Krabbe, MD      . heparin injection 1,600 Units  20 Units/kg Dialysis PRN Maree Krabbe, MD      . heparin injection 5,000 Units  5,000 Units Subcutaneous Q8H Therisa Doyne, MD   5,000 Units at 03/14/11 2303  . hydrALAZINE (APRESOLINE) injection 10 mg  10 mg Intravenous Q4H PRN Therisa Doyne, MD      . lidocaine (XYLOCAINE) 1 % injection 5 mL  5 mL Intradermal PRN Maree Krabbe, MD      .  lidocaine-prilocaine (EMLA) cream 1 application  1 application Topical PRN Maree Krabbe, MD      . metolazone (ZAROXOLYN) tablet 5 mg  5 mg Oral BID Rolan Lipa, NP      . metoprolol (LOPRESSOR) tablet 50 mg  50 mg Oral BID Therisa Doyne, MD   50 mg at 03/14/11 2305  . nitroGLYCERIN (NITRODUR - Dosed in mg/24 hr) patch 0.4 mg  0.4 mg Transdermal Daily Rolan Lipa, NP   0.4 mg at 03/15/11 0439  . ondansetron (ZOFRAN) injection 4 mg  4 mg Intravenous Q6H PRN Therisa Doyne, MD      . pentafluoroprop-tetrafluoroeth (GEBAUERS) aerosol 1 application  1 application Topical PRN Maree Krabbe, MD      . DISCONTD: 0.9 %  sodium chloride infusion  250 mL Intravenous PRN Therisa Doyne, MD      . DISCONTD: furosemide (LASIX) 120 mg in dextrose 5 % 50 mL IVPB  120 mg Intravenous TID Maree Krabbe, MD   120 mg at 03/14/11 2304    . DISCONTD: furosemide (LASIX) 160 mg in dextrose 5 % 50 mL IVPB  160 mg Intravenous TID Rolan Lipa, NP      . DISCONTD: furosemide (LASIX) injection 80 mg  80 mg Intravenous BID Therisa Doyne, MD   80 mg at 03/14/11 0023  . DISCONTD: sodium chloride 0.9 % injection 3 mL  3 mL Intravenous Q12H Therisa Doyne, MD   3 mL at 03/14/11 1050  . DISCONTD: sodium chloride 0.9 % injection 3 mL  3 mL Intravenous PRN Therisa Doyne, MD       Medications Prior to Admission  Medication Sig Dispense Refill  . amLODipine (NORVASC) 5 MG tablet Take 5 mg by mouth at bedtime.      . calcitRIOL (ROCALTROL) 0.25 MCG capsule Take 0.25 mcg by mouth daily.      . calcium carbonate (TUMS - DOSED IN MG ELEMENTAL CALCIUM) 500 MG chewable tablet Chew 1 tablet by mouth 3 (three) times daily.      . furosemide (LASIX) 40 MG tablet Take 40 mg by mouth daily.      . metoprolol (LOPRESSOR) 50 MG tablet Take 50 mg by mouth 2 (two) times daily.      . Vitamin D, Ergocalciferol, (DRISDOL) 50000 UNITS CAPS Take 50,000 Units by mouth every 7 (seven) days.        Results for orders placed during the hospital encounter of 03/13/11 (from the past 48 hour(s))  CARDIAC PANEL(CRET KIN+CKTOT+MB+TROPI)     Status: Abnormal   Collection Time   03/13/11 11:31 PM      Component Value Range Comment   Total CK 330 (*) 7 - 232 (U/L)    CK, MB 5.8 (*) 0.3 - 4.0 (ng/mL)    Troponin I <0.30  <0.30 (ng/mL)    Relative Index 1.8  0.0 - 2.5    CBC     Status: Abnormal   Collection Time   03/13/11 11:32 PM      Component Value Range Comment   WBC 9.8  4.0 - 10.5 (K/uL)    RBC 3.52 (*) 4.22 - 5.81 (MIL/uL)    Hemoglobin 9.6 (*) 13.0 - 17.0 (g/dL)    HCT 16.1 (*) 09.6 - 52.0 (%)    MCV 86.4  78.0 - 100.0 (fL)    MCH 27.3  26.0 - 34.0 (pg)    MCHC 31.6  30.0 - 36.0 (g/dL)    RDW 13.5  11.5 - 15.5 (%)    Platelets 270  150 - 400 (K/uL)   CREATININE, SERUM     Status: Abnormal   Collection Time   03/13/11 11:32 PM       Component Value Range Comment   Creatinine, Ser 12.29 (*) 0.50 - 1.35 (mg/dL)    GFR calc non Af Amer 4 (*) >90 (mL/min)    GFR calc Af Amer 5 (*) >90 (mL/min)   URINALYSIS, ROUTINE W REFLEX MICROSCOPIC     Status: Abnormal   Collection Time   03/14/11  5:40 AM      Component Value Range Comment   Color, Urine YELLOW  YELLOW     APPearance CLEAR  CLEAR     Specific Gravity, Urine 1.018  1.005 - 1.030     pH 7.5  5.0 - 8.0     Glucose, UA 250 (*) NEGATIVE (mg/dL)    Hgb urine dipstick SMALL (*) NEGATIVE     Bilirubin Urine NEGATIVE  NEGATIVE     Ketones, ur NEGATIVE  NEGATIVE (mg/dL)    Protein, ur >147 (*) NEGATIVE (mg/dL)    Urobilinogen, UA 0.2  0.0 - 1.0 (mg/dL)    Nitrite NEGATIVE  NEGATIVE     Leukocytes, UA NEGATIVE  NEGATIVE    URINE MICROSCOPIC-ADD ON     Status: Abnormal   Collection Time   03/14/11  5:40 AM      Component Value Range Comment   Squamous Epithelial / LPF FEW (*) RARE     WBC, UA 3-6  <3 (WBC/hpf)    RBC / HPF 3-6  <3 (RBC/hpf)    Bacteria, UA FEW (*) RARE     Casts HYALINE CASTS (*) NEGATIVE    CARDIAC PANEL(CRET KIN+CKTOT+MB+TROPI)     Status: Abnormal   Collection Time   03/14/11  6:20 AM      Component Value Range Comment   Total CK 300 (*) 7 - 232 (U/L)    CK, MB 5.0 (*) 0.3 - 4.0 (ng/mL)    Troponin I <0.30  <0.30 (ng/mL)    Relative Index 1.7  0.0 - 2.5    BASIC METABOLIC PANEL     Status: Abnormal   Collection Time   03/14/11  6:20 AM      Component Value Range Comment   Sodium 140  135 - 145 (mEq/L)    Potassium 4.2  3.5 - 5.1 (mEq/L)    Chloride 101  96 - 112 (mEq/L)    CO2 28  19 - 32 (mEq/L)    Glucose, Bld 77  70 - 99 (mg/dL)    BUN 69 (*) 6 - 23 (mg/dL)    Creatinine, Ser 82.95 (*) 0.50 - 1.35 (mg/dL)    Calcium 7.3 (*) 8.4 - 10.5 (mg/dL)    GFR calc non Af Amer 4 (*) >90 (mL/min)    GFR calc Af Amer 5 (*) >90 (mL/min)   TSH     Status: Normal   Collection Time   03/14/11  6:20 AM      Component Value Range Comment   TSH 4.305   0.350 - 4.500 (uIU/mL)   CARDIAC PANEL(CRET KIN+CKTOT+MB+TROPI)     Status: Abnormal   Collection Time   03/14/11 11:51 AM      Component Value Range Comment   Total CK 303 (*) 7 - 232 (U/L)    CK, MB 4.8 (*) 0.3 - 4.0 (ng/mL)    Troponin I <0.30  <0.30 (ng/mL)  Relative Index 1.6  0.0 - 2.5    MRSA PCR SCREENING     Status: Normal   Collection Time   03/15/11  2:51 AM      Component Value Range Comment   MRSA by PCR NEGATIVE  NEGATIVE    CBC     Status: Abnormal   Collection Time   03/15/11  8:30 AM      Component Value Range Comment   WBC 9.6  4.0 - 10.5 (K/uL)    RBC 3.59 (*) 4.22 - 5.81 (MIL/uL)    Hemoglobin 9.8 (*) 13.0 - 17.0 (g/dL)    HCT 16.1 (*) 09.6 - 52.0 (%)    MCV 86.4  78.0 - 100.0 (fL)    MCH 27.3  26.0 - 34.0 (pg)    MCHC 31.6  30.0 - 36.0 (g/dL)    RDW 04.5  40.9 - 81.1 (%)    Platelets 259  150 - 400 (K/uL)    Dg Chest 2 View  03/14/2011  *RADIOLOGY REPORT*  Clinical Data: Chest pain.  Swelling in the legs.  Pain.  Shortness of breath.  History of hypertension, CHF.  CHEST - 2 VIEW  Comparison: 03/13/2011  Findings: Heart is enlarged.  There are bilateral lower lobe opacities and pleural effusions.  There is perihilar pulmonary edema.  Bilateral opacities may be related to confluent edema and/or infectious process.  IMPRESSION:  1.  Cardiomegaly and edema. 2.  Bilateral lower lobe opacities and effusions.  Original Report Authenticated By: Patterson Hammersmith, M.D.   Dg Chest Port 1 View  03/14/2011  *RADIOLOGY REPORT*  Clinical Data: Dyspnea, question worsening CHF  PORTABLE CHEST - 1 VIEW  Comparison: Portable exam 2345 hours compared to 1106 hours  Findings: Enlargement of cardiac silhouette with pulmonary vascular congestion. Increased pulmonary edema and pleural effusions compatible with worsening CHF. No pneumothorax. Lordotic positioning.  IMPRESSION: Worsening CHF.  Original Report Authenticated By: Lollie Marrow, M.D.    Review of Systems  Constitutional:  Negative for fever.  Gastrointestinal: Negative for nausea and vomiting.  Neurological: Negative for headaches.    Blood pressure 136/73, pulse 63, temperature 97.9 F (36.6 C), temperature source Oral, resp. rate 16, height 5\' 5"  (1.651 m), weight 176 lb 2.4 oz (79.9 kg), SpO2 96.00%. Physical Exam  Constitutional: He is oriented to person, place, and time.  Cardiovascular: Normal rate, regular rhythm and normal heart sounds.   No murmur heard. Respiratory: Effort normal and breath sounds normal. He has no wheezes.  GI: Soft. Bowel sounds are normal. There is no tenderness.  Neurological: He is alert and oriented to person, place, and time.  Skin: Skin is warm.     Assessment/Plan ESRD; acute on chronic RF; sob; edema Need for dialysis L AVF placed 01/28/11; not mature yet Scheduled for tunneled dialysis catheter placement in IR Pt aware of procedure benefits and risks and agreeable to proceed. Consent in chart and signed.  Markisha Meding A 03/15/2011, 9:40 AM

## 2011-03-15 NOTE — Progress Notes (Signed)
Pt returned from IR, called dr webb for diet order. Waiting for dialysis, v.s stable. Jonathan Terry

## 2011-03-15 NOTE — Progress Notes (Signed)
Subjective: Patient admitted with worsening edema, transferred top 3300 for worsening shortness of breath. He feels better at this time. Nephrology is planning to do dialysis as lasix is not helping with diuresis.  Objective: Vital signs in last 24 hours: Temp:  [97.9 F (36.6 C)-98.9 F (37.2 C)] 97.9 F (36.6 C) (03/04 0720) Pulse Rate:  [63-76] 63  (03/04 0720) Resp:  [16-27] 16  (03/04 0720) BP: (136-178)/(69-85) 136/73 mmHg (03/04 0720) SpO2:  [96 %-100 %] 96 % (03/04 0720) Weight:  [79.9 kg (176 lb 2.4 oz)] 79.9 kg (176 lb 2.4 oz) (03/04 0500) Weight change: 0.339 kg (12 oz) Last BM Date: 03/13/11  Intake/Output from previous day: 03/03 0701 - 03/04 0700 In: 860 [P.O.:600; I.V.:70; IV Piggyback:190] Out: 900 [Urine:900]     Physical Exam: HEENT: Atraumatic, normocephalic Neck: Supple Chest : Bibasilar crackles Heart : S1 S2 Regular, no murmurs Abdomen: Soft, Nontender, no organomegaly Ext : Trace edema in lower extremities   Lab Results: Basic Metabolic Panel:  Basename 03/14/11 0620 03/13/11 2332  NA 140 --  K 4.2 --  CL 101 --  CO2 28 --  GLUCOSE 77 --  BUN 69* --  CREATININE 12.16* 12.29*  CALCIUM 7.3* --  MG -- --  PHOS -- --    CBC:  Basename 03/13/11 2332  WBC 9.8  NEUTROABS --  HGB 9.6*  HCT 30.4*  MCV 86.4  PLT 270   Cardiac Enzymes:  Basename 03/14/11 1151 03/14/11 0620 03/13/11 2331  CKTOTAL 303* 300* 330*  CKMB 4.8* 5.0* 5.8*  CKMBINDEX -- -- --  TROPONINI <0.30 <0.30 <0.30   Thyroid Function Tests:  Basename 03/14/11 0620  TSH 4.305  T4TOTAL --  FREET4 --  T3FREE --  THYROIDAB --   Anemia Panel: No results found for this basename: VITAMINB12,FOLATE,FERRITIN,TIBC,IRON,RETICCTPCT in the last 72 hours Coagulation: No results found for this basename: LABPROT:2,INR:2 in the last 72 hours Urine Drug Screen: Drugs of Abuse  No results found for this basename: labopia,  cocainscrnur,  labbenz,  amphetmu,  thcu,  labbarb      Alcohol Level: No results found for this basename: ETH:2 in the last 72 hours Urinalysis:  Basename 03/14/11 0540  COLORURINE YELLOW  LABSPEC 1.018  PHURINE 7.5  GLUCOSEU 250*  HGBUR SMALL*  BILIRUBINUR NEGATIVE  KETONESUR NEGATIVE  PROTEINUR >300*  UROBILINOGEN 0.2  NITRITE NEGATIVE  LEUKOCYTESUR NEGATIVE    Studies/Results: Dg Chest 2 View  03/14/2011  *RADIOLOGY REPORT*  Clinical Data: Chest pain.  Swelling in the legs.  Pain.  Shortness of breath.  History of hypertension, CHF.  CHEST - 2 VIEW  Comparison: 03/13/2011  Findings: Heart is enlarged.  There are bilateral lower lobe opacities and pleural effusions.  There is perihilar pulmonary edema.  Bilateral opacities may be related to confluent edema and/or infectious process.  IMPRESSION:  1.  Cardiomegaly and edema. 2.  Bilateral lower lobe opacities and effusions.  Original Report Authenticated By: Patterson Hammersmith, M.D.   Dg Chest Port 1 View  03/14/2011  *RADIOLOGY REPORT*  Clinical Data: Dyspnea, question worsening CHF  PORTABLE CHEST - 1 VIEW  Comparison: Portable exam 2345 hours compared to 1106 hours  Findings: Enlargement of cardiac silhouette with pulmonary vascular congestion. Increased pulmonary edema and pleural effusions compatible with worsening CHF. No pneumothorax. Lordotic positioning.  IMPRESSION: Worsening CHF.  Original Report Authenticated By: Lollie Marrow, M.D.    Medications: Scheduled Meds:    . amLODipine  5 mg Oral QHS  .  aspirin EC  81 mg Oral Daily  . calcitRIOL  0.25 mcg Oral Daily  . calcium carbonate  1 tablet Oral TID WC  . furosemide  160 mg Intravenous TID  . heparin  5,000 Units Subcutaneous Q8H  . metolazone  5 mg Oral BID  . metoprolol  50 mg Oral BID  . nitroGLYCERIN  0.4 mg Transdermal Daily  . DISCONTD: furosemide  120 mg Intravenous TID  . DISCONTD: furosemide  160 mg Intravenous TID  . DISCONTD: furosemide  80 mg Intravenous BID  . DISCONTD: sodium chloride  3 mL Intravenous  Q12H   Continuous Infusions:  PRN Meds:.sodium chloride, sodium chloride, acetaminophen, ALPRAZolam, alteplase, feeding supplement (NEPRO CARB STEADY), heparin, heparin, heparin, hydrALAZINE, lidocaine, lidocaine-prilocaine, ondansetron (ZOFRAN) IV, pentafluoroprop-tetrafluoroeth, DISCONTD: sodium chloride, DISCONTD: sodium chloride  Assessment/Plan:  Principal Problem:  Renal failure (ARF), acute on chronic Plan is for hemodialysis per renal   Pulmonary edema Improved. Continue lasix 60 mg IV TID  Congestive heart failure Cardiac enzymes x 3 are negative. Continue IV lasix. EF 45%   Anemia Chronic. Sec to ESRD  Hypertension Continue metoprolol.  DVT prophylaxis Heparin     LOS: 2 days   [facility] Pager: [phone] 03/15/2011, 8:28 AM

## 2011-03-15 NOTE — Progress Notes (Signed)
Utilization Review Completed.  Vania Rosero T  03/15/2011  

## 2011-03-15 NOTE — Progress Notes (Signed)
Subjective: Became SOB overnight, CXR showed worsening CHF. Transferred to 3300, NTG patch places. No SOB this am. 900 cc out yest on high dose lasix.  Objective Vital signs in last 24 hours: Filed Vitals:   03/14/11 2303 03/15/11 0225 03/15/11 0400 03/15/11 0500  BP: 162/69 175/84 156/77   Pulse: 74 74 72   Temp:   98.7 F (37.1 C)   TempSrc:   Oral   Resp: 26 27 22    Height:      Weight:    79.9 kg (176 lb 2.4 oz)  SpO2: 98% 98% 99%    Weight change: 0.339 kg (12 oz)  Intake/Output Summary (Last 24 hours) at 03/15/11 0804 Last data filed at 03/15/11 0423  Gross per 24 hour  Intake    860 ml  Output    900 ml  Net    -40 ml   Labs: Basic Metabolic Panel:  Lab 03/14/11 1610 03/13/11 2332  NA 140 --  K 4.2 --  CL 101 --  CO2 28 --  GLUCOSE 77 --  BUN 69* --  CREATININE 12.16* 12.29*  ALB -- --  CALCIUM 7.3* --  PHOS -- --   Liver Function Tests: No results found for this basename: AST:3,ALT:3,ALKPHOS:3,BILITOT:3,PROT:3,ALBUMIN:3 in the last 168 hours No results found for this basename: LIPASE:3,AMYLASE:3 in the last 168 hours No results found for this basename: AMMONIA:3 in the last 168 hours CBC:  Lab 03/13/11 2332  WBC 9.8  NEUTROABS --  HGB 9.6*  HCT 30.4*  MCV 86.4  PLT 270   PT/INR: @labrcntip (inr:5) Cardiac Enzymes:  Lab 03/14/11 1151 03/14/11 0620 03/13/11 2331  CKTOTAL 303* 300* 330*  CKMB 4.8* 5.0* 5.8*  CKMBINDEX -- -- --  TROPONINI <0.30 <0.30 <0.30   CBG: No results found for this basename: GLUCAP:5 in the last 168 hours  Iron Studies: No results found for this basename: IRON:30,TIBC:30,TRANSFERRIN:30,FERRITIN:30 in the last 168 hours Studies/Results: Dg Chest 2 View  03/14/2011  *RADIOLOGY REPORT*  Clinical Data: Chest pain.  Swelling in the legs.  Pain.  Shortness of breath.  History of hypertension, CHF.  CHEST - 2 VIEW  Comparison: 03/13/2011  Findings: Heart is enlarged.  There are bilateral lower lobe opacities and pleural  effusions.  There is perihilar pulmonary edema.  Bilateral opacities may be related to confluent edema and/or infectious process.  IMPRESSION:  1.  Cardiomegaly and edema. 2.  Bilateral lower lobe opacities and effusions.  Original Report Authenticated By: Patterson Hammersmith, M.D.   Dg Chest Port 1 View  03/14/2011  *RADIOLOGY REPORT*  Clinical Data: Dyspnea, question worsening CHF  PORTABLE CHEST - 1 VIEW  Comparison: Portable exam 2345 hours compared to 1106 hours  Findings: Enlargement of cardiac silhouette with pulmonary vascular congestion. Increased pulmonary edema and pleural effusions compatible with worsening CHF. No pneumothorax. Lordotic positioning.  IMPRESSION: Worsening CHF.  Original Report Authenticated By: Lollie Marrow, M.D.   Medications:      . amLODipine  5 mg Oral QHS  . aspirin EC  81 mg Oral Daily  . calcitRIOL  0.25 mcg Oral Daily  . calcium carbonate  1 tablet Oral TID WC  . furosemide  160 mg Intravenous TID  . heparin  5,000 Units Subcutaneous Q8H  . metolazone  5 mg Oral BID  . metoprolol  50 mg Oral BID  . nitroGLYCERIN  0.4 mg Transdermal Daily  . DISCONTD: furosemide  120 mg Intravenous TID  . DISCONTD: furosemide  160 mg Intravenous  TID  . DISCONTD: furosemide  80 mg Intravenous BID  . DISCONTD: sodium chloride  3 mL Intravenous Q12H    I  have reviewed scheduled and prn medications.  Physical Exam:  Blood pressure 156/77, pulse 72, temperature 98.7 F (37.1 C), temperature source Oral, resp. rate 22, height 5\' 5"  (1.651 m), weight 79.9 kg (176 lb 2.4 oz), SpO2 99.00%.  Gen: alert, no distress Skin: no rash, cyanosis Neck: no JVD, bruits or LAN Chest: bibasilar crackles Heart: regular, no rub or gallop Abdomen: soft, nontender Ext: bilat mild-mod edema Neuro: alert, Ox3, no focal deficit Heme/Lymph: no bruising or LAN  Assessment/Plan  1. CKD, advanced stage V - now with pulm edema, responding to IV lasix but not dramatically.  Will need to start  HD.  IR has been consulted for placement of TDC today and HD orders written.  Fluid restrict, nitrates added.  If SOB recurs, would use IV NTG and Bipap to temporize.   2. S/P AVF left arm on Jan 17th- not ready to use, but is maturing well.  3. 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 Anemia- Hb 9's  Vinson Moselle  MD [facility] [phone] pgr    [phone] cell 03/15/2011, 8:04 AM

## 2011-03-15 NOTE — Procedures (Signed)
Placement of right jugular 23 cm tip-cuff hemodialysis (HemoSplit) catheter.  Tip at cavoatrial junction and ready to use.

## 2011-03-15 NOTE — Progress Notes (Signed)
[facility] hospitalist progress note. Chief complaint. Dyspnea. History of present illness. This 56 year old male admitted with acute on chronic renal failure. Also a consult in this hospitalization with nephrology. Patient has an AV graft placed to the left arm but this has not yet matured. Patient came in with acute congestive heart failure now on height dose Lasix IV. The patient complained of shortness of breath to the nursing staff I saw the patient at bedside. His urine output for the last 12 hours is approximately 450 cc. Net output appears negative. Also weight is slightly up since admission. A repeat chest x-ray was obtained portably and this indicated worsening congestive heart failure. I did contact Dr. Arlean Hopping of nephrology to update him on these changes. The patient himself demonstrates some mild mild dyspnea and some mild use of accessory respiratory muscles but certainly not in distress and his O2 sats rations are quite stable. Vital signs temperature 98.7, pulse 75, respiration 18, blood pressure 162/69. O2 sats 99%. Gen. appearance. Well-developed male in no distress. Alert, cooperative, oriented. Neck. There is a significant jugular venous distention evident. Cardiac. Rate and rhythm regular. Jugular venous distention observed. He has edema from the foot to the thigh bilaterally. Also some sacral edema noted. Lungs. Breath sounds are absent in the bases bilaterally. Otherwise clear with no distress. There is some mild mild increase work of breathing however. Abdomen. Soft with positive bowel sounds. No pain. Impression/plan. Problem #1. Congestive heart failure. As per my discussion with Dr. Arlean Hopping we will move the patient to a step down for closer monitoring. Currently appears stable on his nasal cannula oxygen but would use BiPAP if his condition deteriorated. We will increase his Lasix to 160 mg IV 3 times daily. I have also started on Zaroxolyn 5 mg by mouth twice daily per  recommendation. Change to a renal diet with 1200 cc per day fluid restriction and n.p.o. after midnight. Dr. Arlean Hopping has arranged for interventional radiology to place a temporary dialysis catheter later this a.m. in the thought that he may likely require dialysis. He has a AV graft in the left arm but this has not matured. Will of placed on a nitroglycerin patch 0.4 mg and hold this for any systolic blood pressure less than 100. All of these changes are in concert with nephrology.

## 2011-03-16 ENCOUNTER — Inpatient Hospital Stay (HOSPITAL_COMMUNITY): Payer: Medicaid Other

## 2011-03-16 LAB — RENAL FUNCTION PANEL
Albumin: 1.6 g/dL — ABNORMAL LOW (ref 3.5–5.2)
BUN: 50 mg/dL — ABNORMAL HIGH (ref 6–23)
CO2: 27 meq/L (ref 19–32)
CO2: 26 mEq/L (ref 19–32)
Calcium: 7.3 mg/dL — ABNORMAL LOW (ref 8.4–10.5)
Calcium: 7.3 mg/dL — ABNORMAL LOW (ref 8.4–10.5)
Chloride: 98 meq/L (ref 96–112)
Chloride: 98 mEq/L (ref 96–112)
Creatinine, Ser: 9.1 mg/dL — ABNORMAL HIGH (ref 0.50–1.35)
Creatinine, Ser: 9.33 mg/dL — ABNORMAL HIGH (ref 0.50–1.35)
GFR calc Af Amer: 7 mL/min — ABNORMAL LOW
GFR calc Af Amer: 6 mL/min — ABNORMAL LOW (ref >90)
GFR calc non Af Amer: 6 mL/min — ABNORMAL LOW
GFR calc non Af Amer: 5 mL/min — ABNORMAL LOW (ref >90)
Glucose, Bld: 131 mg/dL — ABNORMAL HIGH (ref 70–99)
Glucose, Bld: 124 mg/dL — ABNORMAL HIGH (ref 70–99)
Phosphorus: 5.3 mg/dL — ABNORMAL HIGH (ref 2.3–4.6)
Potassium: 4 meq/L (ref 3.5–5.1)
Sodium: 135 meq/L (ref 135–145)
Sodium: 135 mEq/L (ref 135–145)

## 2011-03-16 LAB — HEPATITIS B SURFACE ANTIBODY,QUALITATIVE: Hep B S Ab: POSITIVE — AB

## 2011-03-16 LAB — HEPATITIS B CORE ANTIBODY, TOTAL: Hep B Core Total Ab: NEGATIVE

## 2011-03-16 MED ORDER — PARICALCITOL 5 MCG/ML IV SOLN
1.0000 ug | INTRAVENOUS | Status: DC
  Administered 2011-03-18 – 2011-03-20 (×2): 1 ug via INTRAVENOUS
  Filled 2011-03-16 (×2): qty 0.2

## 2011-03-16 MED ORDER — HEPARIN SODIUM (PORCINE) 1000 UNIT/ML DIALYSIS
20.0000 [IU]/kg | INTRAMUSCULAR | Status: DC | PRN
  Administered 2011-03-18 – 2011-03-20 (×2): 1500 [IU] via INTRAVENOUS_CENTRAL
  Filled 2011-03-16: qty 2

## 2011-03-16 MED ORDER — RENA-VITE PO TABS
1.0000 | ORAL_TABLET | Freq: Every day | ORAL | Status: DC
  Administered 2011-03-16 – 2011-03-19 (×4): 1 via ORAL
  Filled 2011-03-16 (×5): qty 1

## 2011-03-16 MED ORDER — DARBEPOETIN ALFA-POLYSORBATE 100 MCG/0.5ML IJ SOLN: 100.0000 ug | INTRAMUSCULAR | Status: DC

## 2011-03-16 NOTE — Procedures (Signed)
I have personally attended this patient's dialysis session. This is his second treatment - well tolerated thus far.

## 2011-03-16 NOTE — Progress Notes (Addendum)
Subjective: Patient admitted with worsening edema, transferred top 3300 for worsening shortness of breath. Had HD catheter placement yesterday, and went for hemodialysis last night. 2.5 liters removed, He is now breathing better.  Objective: Vital signs in last 24 hours: Temp:  [97.5 F (36.4 C)-99 F (37.2 C)] 99 F (37.2 C) (03/05 0335) Pulse Rate:  [71-91] 76  (03/05 0335) Resp:  [11-20] 20  (03/04 2336) BP: (121-167)/(67-87) 145/79 mmHg (03/05 0335) SpO2:  [94 %-100 %] 94 % (03/05 0335) Weight:  [76.9 kg (169 lb 8.5 oz)-77.1 kg (169 lb 15.6 oz)] 77.1 kg (169 lb 15.6 oz) (03/05 0335) Weight change: -3 kg (-6 lb 9.8 oz) Last BM Date: 03/13/11  Intake/Output from previous day: 03/04 0701 - 03/05 0700 In: 782 [P.O.:600; IV Piggyback:182] Out: 3955 [Urine:1450]     Physical Exam: HEENT: Atraumatic, normocephalic Neck: Supple Chest : Scattered wheezing Heart : S1 S2 Regular, no murmurs Abdomen: Soft, Nontender, no organomegaly Ext : No edema   Lab Results: Basic Metabolic Panel:  Basename 03/16/11 0405 03/15/11 0830  NA 135 139  K 4.0 4.9  CL 98 101  CO2 27 26  GLUCOSE 131* 84  BUN 50* 73*  CREATININE 9.10* 12.08*  CALCIUM 7.3* 7.7*  MG -- --  PHOS 5.3* 7.0*    CBC:  Basename 03/15/11 0830 03/13/11 2332  WBC 9.6 9.8  NEUTROABS -- --  HGB 9.8* 9.6*  HCT 31.0* 30.4*  MCV 86.4 86.4  PLT 259 270   Cardiac Enzymes:  Basename 03/14/11 1151 03/14/11 0620 03/13/11 2331  CKTOTAL 303* 300* 330*  CKMB 4.8* 5.0* 5.8*  CKMBINDEX -- -- --  TROPONINI <0.30 <0.30 <0.30   Thyroid Function Tests:  Basename 03/14/11 0620  TSH 4.305  T4TOTAL --  FREET4 --  T3FREE --  THYROIDAB --   Anemia Panel: No results found for this basename: VITAMINB12,FOLATE,FERRITIN,TIBC,IRON,RETICCTPCT in the last 72 hours Coagulation:  Basename 03/15/11 1009  LABPROT 14.2  INR 1.08   Urine Drug Screen: Drugs of Abuse  No results found for this basename: labopia,  cocainscrnur,   labbenz,  amphetmu,  thcu,  labbarb    Alcohol Level: No results found for this basename: ETH:2 in the last 72 hours Urinalysis:  Basename 03/14/11 0540  COLORURINE YELLOW  LABSPEC 1.018  PHURINE 7.5  GLUCOSEU 250*  HGBUR SMALL*  BILIRUBINUR NEGATIVE  KETONESUR NEGATIVE  PROTEINUR >300*  UROBILINOGEN 0.2  NITRITE NEGATIVE  LEUKOCYTESUR NEGATIVE    Studies/Results: Dg Chest 2 View  03/14/2011  IMPRESSION:  1.  Cardiomegaly and edema. 2.  Bilateral lower lobe opacities and effusions.    Ir Fluoro Guide Cv Line Right  03/15/2011  *RADIOLOGY REPORT*  Indication:  End-stage renal disease and needs hemodialysis.  PROCEDURE(S): FLUOROSCOPIC AND ULTRASOUND GUIDED PLACEMENT OF A TUNNELED DIAYSIS CATHETER  Physician:  Rachelle Hora. Henn, MD  Medications: Versed 2 mg, Fentanyl 100 mcg. A radiology nurse monitored the patient for moderate sedation.  As antibiotic prophylaxis, Ancef 1 gm was ordered pre-procedure and administered intravenously within one hour of incision.  Moderate sedation time: 25 minutes  Fluoroscopy time: 1.33minutes  Procedure:Informed consent was obtained for placement of a tunneled dialysis catheter.  The patient was placed supine on the interventional table.  Ultrasound confirmed a patent right internal jugularvein.  Ultrasound images were obtained for documentation. The right neck and chest were prepped and draped in a sterile fashion.  The right neck was anesthetized with 1% lidocaine. Maximal barrier sterile technique was utilized including caps,  mask, sterile gowns, sterile gloves, sterile drape, hand hygiene and skin antiseptic.  A small incision was made with #11 blade scalpel.  A 21 gauge needle directed into the right internal jugular vein with ultrasound guidance.  A micropuncture dilator set was placed.  A 23 cm tip to cuff HemoSplit catheter was selected. The skin below the right clavicle was anesthetized and a small incision was made with an #11 blade scalpel.  A subcutaneous  tunnel was formed to the vein dermatotomy site.  The catheter was brought through the tunnel.  The vein dermatotomy site was dilated to accommodate a peel-away sheath.  The catheter was placed through the peel-away sheath and directed into the central venous structures.  The tip of the catheter was placed at the cavoatrial junction with fluoroscopy.  Fluoroscopic images were obtained for documentation.  Both lumens were found to aspirate and flush well. The proper amount of heparin was flushed in both lumens.  The vein dermatotomy site was closed using a single layer of absorbable suture and Dermabond.  The catheter was secured to the skin using Prolene suture.  Findings:Catheter tip at the cavoatrial junction.  Complications: None  Impression:Successful placement of a right chest tunneled dialysis catheter using ultrasound and fluoroscopic guidance.  Original Report Authenticated By: Richarda Overlie, M.D.     Dg Chest Port 1 View  03/14/2011  IMPRESSION: Worsening CHF.     Medications: Scheduled Meds:    . amLODipine  5 mg Oral QHS  . aspirin EC  81 mg Oral Daily  . calcitRIOL  0.25 mcg Oral Daily  . calcium carbonate  1 tablet Oral TID WC  .  ceFAZolin (ANCEF) IV  1 g Intravenous 60 min Pre-Op  . furosemide  160 mg Intravenous TID  . gelatin adsorbable      . heparin      . heparin  5,000 Units Subcutaneous Q8H  . metolazone  5 mg Oral BID  . metoprolol  50 mg Oral BID  . nitroGLYCERIN  0.4 mg Transdermal Daily   Continuous Infusions:  PRN Meds:.sodium chloride, sodium chloride, acetaminophen, ALPRAZolam, alteplase, feeding supplement (NEPRO CARB STEADY), fentaNYL, heparin, heparin, heparin, heparin, hydrALAZINE, lidocaine, lidocaine-prilocaine, midazolam, ondansetron (ZOFRAN) IV, pentafluoroprop-tetrafluoroeth  Assessment/Plan:  Principal Problem:  Renal failure (ARF), acute on chronic( New Onset) CKD Stage V HD catheter placed Underwent HD last night Nephrology  following   Pulmonary edema Improved. Continue to monitor  Congestive heart failure Cardiac enzymes x 3 are negative. EF 45% Improved after Hemodialysis   Anemia Chronic. Sec to ESRD  Hypertension Continue metoprolol.  DVT prophylaxis Heparin  Will transfer to floor with tele....   LOS: 3 days   [facility] Pager: [phone] 03/16/2011, 7:27 AM

## 2011-03-16 NOTE — Progress Notes (Signed)
Subjective: Interval History:  Had 1st HD yesterday - quite a bit of improvement in SOB Permcath worked well To get second treatment today Objective:  Vital signs in last 24 hours:  Temp:  [97.5 F (36.4 C)-99.6 F (37.6 C)] 99.6 F (37.6 C) (03/05 0716) Pulse Rate:  [71-91] 77  (03/05 0716) Resp:  [11-20] 17  (03/05 0716) BP: (121-167)/(67-87) 163/77 mmHg (03/05 0716) SpO2:  [94 %-100 %] 99 % (03/05 0716) Weight:  [76.9 kg (169 lb 8.5 oz)-77.1 kg (169 lb 15.6 oz)] 77.1 kg (169 lb 15.6 oz) (03/05 0335) Weight change: -3 kg (-6 lb 9.8 oz)  Intake/Output: I/O last 3 completed shifts: In: 868 [P.O.:600; I.V.:20; IV Piggyback:248] Out: 5621 [HYQMV:7846; Other:2505]  Intake/Output this shift:   Medications Reviewed: . amLODipine  5 mg Oral QHS  . aspirin EC  81 mg Oral Daily  . calcitRIOL  0.25 mcg Oral Daily  . calcium carbonate  1 tablet Oral TID WC  .  ceFAZolin (ANCEF) IV  1 g Intravenous 60 min Pre-Op  . furosemide  160 mg Intravenous TID  . gelatin adsorbable      . heparin      . heparin  5,000 Units Subcutaneous Q8H  . metolazone  5 mg Oral BID  . metoprolol  50 mg Oral BID  . nitroGLYCERIN  0.4 mg Transdermal Daily   EXAM: Very nice Hispanic man  NAD VS as above Lungs with few crackles No rub Left upper AVF good bruit and thrill 1+ edema LE's Lab Results:  Basename 03/15/11 0830 03/13/11 2332  WBC 9.6 9.8  HGB 9.8* 9.6*  HCT 31.0* 30.4*  PLT 259 270   BMET  Basename 03/16/11 0405 03/15/11 0830 03/14/11 0620  NA 135 139 140  K 4.0 4.9 4.2  CL 98 101 101  CO2 27 26 28   GLUCOSE 131* 84 77  BUN 50* 73* 69*  CREATININE 9.10* 12.08* 12.16*  CALCIUM 7.3* 7.7* 7.3*  PHOS 5.3* 7.0* --   LFT  Basename 03/16/11 0405  PROT --  ALBUMIN 1.6*  AST --  ALT --  ALKPHOS --  BILITOT --  BILIDIR --  IBILI --   PT/INR  Basename 03/15/11 1009  LABPROT 14.2  INR 1.08   Hepatitis Panel No results found for this basename: HEPBSAG,HCVAB,HEPAIGM,HEPBIGM  in the last 72 hours PTH: Lab Results  Component Value Date   PTH 362.9* 01/26/2011   CALCIUM 7.3* 03/16/2011   PHOS 5.3* 03/16/2011    Studies/Results: Ir Fluoro Guide Cv Line Right  03/15/2011  *RADIOLOGY REPORT*  Indication:  End-stage renal disease and needs hemodialysis.  PROCEDURE(S): FLUOROSCOPIC AND ULTRASOUND GUIDED PLACEMENT OF A TUNNELED DIAYSIS CATHETER  Physician:  Rachelle Hora. Henn, MD  Medications: Versed 2 mg, Fentanyl 100 mcg. A radiology nurse monitored the patient for moderate sedation.  As antibiotic prophylaxis, Ancef 1 gm was ordered pre-procedure and administered intravenously within one hour of incision.  Moderate sedation time: 25 minutes  Fluoroscopy time: 1.40minutes  Procedure:Informed consent was obtained for placement of a tunneled dialysis catheter.  The patient was placed supine on the interventional table.  Ultrasound confirmed a patent right internal jugularvein.  Ultrasound images were obtained for documentation. The right neck and chest were prepped and draped in a sterile fashion.  The right neck was anesthetized with 1% lidocaine. Maximal barrier sterile technique was utilized including caps, mask, sterile gowns, sterile gloves, sterile drape, hand hygiene and skin antiseptic.  A small incision was made with #11  blade scalpel.  A 21 gauge needle directed into the right internal jugular vein with ultrasound guidance.  A micropuncture dilator set was placed.  A 23 cm tip to cuff HemoSplit catheter was selected. The skin below the right clavicle was anesthetized and a small incision was made with an #11 blade scalpel.  A subcutaneous tunnel was formed to the vein dermatotomy site.  The catheter was brought through the tunnel.  The vein dermatotomy site was dilated to accommodate a peel-away sheath.  The catheter was placed through the peel-away sheath and directed into the central venous structures.  The tip of the catheter was placed at the cavoatrial junction with fluoroscopy.   Fluoroscopic images were obtained for documentation.  Both lumens were found to aspirate and flush well. The proper amount of heparin was flushed in both lumens.  The vein dermatotomy site was closed using a single layer of absorbable suture and Dermabond.  The catheter was secured to the skin using Prolene suture.  Findings:Catheter tip at the cavoatrial junction.  Complications: None  Impression:Successful placement of a right chest tunneled dialysis catheter using ultrasound and fluoroscopic guidance.  Original Report Authenticated By: Richarda Overlie, M.D.   Ir US Guide Vasc Access Right  03/15/2011  *RADIOLOGY REPORT*  Indication:  End-stage renal disease and needs hemodialysis.  PROCEDURE(S): FLUOROSCOPIC AND ULTRASOUND GUIDED PLACEMENT OF A TUNNELED DIAYSIS CATHETER  Physician:  Rachelle Hora. Henn, MD  Medications: Versed 2 mg, Fentanyl 100 mcg. A radiology nurse monitored the patient for moderate sedation.  As antibiotic prophylaxis, Ancef 1 gm was ordered pre-procedure and administered intravenously within one hour of incision.  Moderate sedation time: 25 minutes  Fluoroscopy time: 1.50minutes  Procedure:Informed consent was obtained for placement of a tunneled dialysis catheter.  The patient was placed supine on the interventional table.  Ultrasound confirmed a patent right internal jugularvein.  Ultrasound images were obtained for documentation. The right neck and chest were prepped and draped in a sterile fashion.  The right neck was anesthetized with 1% lidocaine. Maximal barrier sterile technique was utilized including caps, mask, sterile gowns, sterile gloves, sterile drape, hand hygiene and skin antiseptic.  A small incision was made with #11 blade scalpel.  A 21 gauge needle directed into the right internal jugular vein with ultrasound guidance.  A micropuncture dilator set was placed.  A 23 cm tip to cuff HemoSplit catheter was selected. The skin below the right clavicle was anesthetized and a small incision  was made with an #11 blade scalpel.  A subcutaneous tunnel was formed to the vein dermatotomy site.  The catheter was brought through the tunnel.  The vein dermatotomy site was dilated to accommodate a peel-away sheath.  The catheter was placed through the peel-away sheath and directed into the central venous structures.  The tip of the catheter was placed at the cavoatrial junction with fluoroscopy.  Fluoroscopic images were obtained for documentation.  Both lumens were found to aspirate and flush well. The proper amount of heparin was flushed in both lumens.  The vein dermatotomy site was closed using a single layer of absorbable suture and Dermabond.  The catheter was secured to the skin using Prolene suture.  Findings:Catheter tip at the cavoatrial junction.  Complications: None  Impression:Successful placement of a right chest tunneled dialysis catheter using ultrasound and fluoroscopic guidance.  Original Report Authenticated By: Richarda Overlie, M.D.   Dg Chest Port 1 View  03/14/2011  *RADIOLOGY REPORT*  Clinical Data: Dyspnea, question worsening CHF  PORTABLE  CHEST - 1 VIEW  Comparison: Portable exam 2345 hours compared to 1106 hours  Findings: Enlargement of cardiac silhouette with pulmonary vascular congestion. Increased pulmonary edema and pleural effusions compatible with worsening CHF. No pneumothorax. Lordotic positioning.  IMPRESSION: Worsening CHF.  Original Report Authenticated By: Lollie Marrow, M.D.    Assessment/Plan: 1. CKD, advanced stage V - new ESRD - will commit to long term dialysis; for second HD today; has immature AVF and PC (placed yesterday in IR) Did well with first treatment; lives in [place]; says he has no insurance; will need emergency Medicaid application 2. S/P AVF left arm on Jan 17th- not ready to use, but is maturing well.  3. 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 Anemia- Hb 9's Start Aranesp and check  iron studies 6. CHF due to volume overload - improved    LOS: 3 [facility] B @TODAY @12 :53 PM

## 2011-03-17 LAB — RENAL FUNCTION PANEL
Albumin: 1.7 g/dL — ABNORMAL LOW (ref 3.5–5.2)
CO2: 27 mEq/L (ref 19–32)
Chloride: 102 mEq/L (ref 96–112)
GFR calc non Af Amer: 7 mL/min — ABNORMAL LOW (ref >90)
Potassium: 3.6 mEq/L (ref 3.5–5.1)

## 2011-03-17 MED ORDER — NEPRO/CARBSTEADY PO LIQD
237.0000 mL | Freq: Three times a day (TID) | ORAL | Status: DC
  Filled 2011-03-17 (×6): qty 237

## 2011-03-17 NOTE — Progress Notes (Signed)
   CARE MANAGEMENT NOTE 03/17/2011  Patient:  Jonathan Terry, Jonathan Terry   Account Number:  [id number]  Date Initiated:  03/15/2011  Documentation initiated by:  Junius Creamer  Subjective/Objective Assessment:   adm w renal failure     Action/Plan:   lives w friend   Anticipated DC Date:  03/19/2011   Anticipated DC Plan:  HOME/SELF CARE      DC Planning Services  CM consult  Follow-up appt scheduled      Choice offered to / List presented to:             Status of service:   Medicare Important Message given?   (If response is "NO", the following Medicare IM given date fields will be blank) Date Medicare IM given:   Date Additional Medicare IM given:    Discharge Disposition:  HOME/SELF CARE  Per UR Regulation:    Comments:  3/6 transf from sdu today to med floor. spoke w pt. he has appt sched for [facility] clinic in [place] he has been indep pta and not homecound. will cont to follow for hhc needs as pt progresses. debbie Oswaldo Cueto rn,bsn [phone]  3/4 has appt w fam medicine at [facility] clinic on 3-13 at 2pm. debbie Malone Admire rn,bsn [phone]

## 2011-03-17 NOTE — Progress Notes (Signed)
Subjective: Interval History:  Second dialysis yesterday was well tolerated Says he feels fine We have initiated process of getting outpt spot Objective:  Vital signs in last 24 hours:  Temp:  [98 F (36.7 C)-99.3 F (37.4 C)] 99.3 F (37.4 C) (03/06 0733) Pulse Rate:  [70-82] 81  (03/06 0926) Resp:  [15-24] 21  (03/06 0924) BP: (131-172)/(70-91) 161/76 mmHg (03/06 0926) SpO2:  [95 %-99 %] 99 % (03/06 0924) Weight:  [74.2 kg (163 lb 9.3 oz)-75 kg (165 lb 5.5 oz)] 75 kg (165 lb 5.5 oz) (03/06 0500) Weight change: -2.7 kg (-5 lb 15.2 oz)  Intake/Output: I/O last 3 completed shifts: In: 1080 [P.O.:1080] Out: 6905 [Urine:1600; Other:5305]  Intake/Output this shift:  Total I/O In: 120 [P.O.:120] Out: -   Scheduled Meds Reviewed:  . amLODipine  5 mg Oral QHS  . aspirin EC  81 mg Oral Daily  . calcium carbonate  1 tablet Oral TID WC  . darbepoetin (ARANESP) injection - DIALYSIS  100 mcg Intravenous Q Tue-HD  . heparin  5,000 Units Subcutaneous Q8H  . metoprolol  50 mg Oral BID  . multivitamin  1 tablet Oral QHS  . nitroGLYCERIN  0.4 mg Transdermal Daily  . paricalcitol  1 mcg Intravenous Q T,Th,Sa-HD  . DISCONTD: calcitRIOL  0.25 mcg Oral Daily  . DISCONTD: feeding supplement (NEPRO CARB STEADY)  237 mL Oral TID WC & HS  . DISCONTD: furosemide  160 mg Intravenous TID  . DISCONTD: metolazone  5 mg Oral BID   EXAM: VS as noted NAD No jvd Lungs clear S1S2 No S3 or rub Abdomen soft Extremities no edema Left BC AVF with good bruit and thrill Lab Results:  Basename 03/15/11 0830  WBC 9.6  HGB 9.8*  HCT 31.0*  PLT 259   BMET  Basename 03/17/11 0415 03/16/11 1459 03/16/11 0405  NA 138 135 135  K 3.6 4.2 4.0  CL 102 98 98  CO2 27 26 27   GLUCOSE 88 124* 131*  BUN 37* 52* 50*  CREATININE 7.67* 9.33* 9.10*  CALCIUM 7.7* 7.3* 7.3*  PHOS 4.1 5.2* 5.3*   LFT  Basename 03/17/11 0415  PROT --  ALBUMIN 1.7*  AST --  ALT --  ALKPHOS --  BILITOT --  BILIDIR --    IBILI --   PT/INR  Basename 03/15/11 1009  LABPROT 14.2  INR 1.08   PTH: Lab Results  Component Value Date   PTH 362.9* 01/26/2011   CALCIUM 7.7* 03/17/2011   PHOS 4.1 03/17/2011   Lab Results  Component Value Date   IRON 57 01/26/2011   TIBC 163* 01/26/2011   FERRITIN 317 01/26/2011   Assessment/Plan:Assessment/Plan:  1. CKD, advanced stage V - new ESRD - will commit to long term dialysis; has had HD X 2; has immature AVF (created 01/2011) and PC. Lives in [place].  CLIP process started. Next HD Thursday. 2. S/P AVF left arm on Jan 17th- not ready to use, but is maturing well.  3. HTN- on norvasc and metoprolol  4. MBD-  CaCO3 binder;Zemplar with HD  5. Anemia- Hb 9's on weekly Aranesp and will check iron studies (last done 01/2011) 6. CHF due to volume overload - resolved    LOS: 4 Norita Meigs B @TODAY @10 :55 AM

## 2011-03-17 NOTE — Progress Notes (Signed)
Subjective: Endorses marked improvement in respiratory symptoms with initiation of hemodialysis. Denies issues such as chest pain prior to admission.  No ha, f/c, n/v.    Objective: Blood pressure 162/76, pulse 80, temperature 99.4 F (37.4 C), temperature source Oral, resp. rate 20, height 5\' 5"  (1.651 m), weight 75 kg (165 lb 5.5 oz), SpO2 96.00%.  Intake/Output from previous day: 03/05 0701 - 03/06 0700 In: 720 [P.O.:720] Out: 3750 [Urine:950] Total I/O In: 360 [P.O.:360] Out: -   Physical Exam: HEENT: Atraumatic, normocephalic Chest : Clear to auscultation bilaterally, room air with saturations 96% Heart : S1 S2 Regular, no murmurs, maintaining sinus rhythm Abdomen: Soft, Nontender, no organomegaly Ext : No edema, cyanosis or clubbing; has immature left upper arm brachial cephalic AV fistula created January 2013  Lab Results: Basic Metabolic Panel:  Basename 03/17/11 0415 03/16/11 1459  NA 138 135  K 3.6 4.2  CL 102 98  CO2 27 26  GLUCOSE 88 124*  BUN 37* 52*  CREATININE 7.67* 9.33*  CALCIUM 7.7* 7.3*  MG -- --  PHOS 4.1 5.2*   CBC:  Basename 03/15/11 0830  WBC 9.6  NEUTROABS --  HGB 9.8*  HCT 31.0*  MCV 86.4  PLT 259   Coagulation:  Basename 03/15/11 1009  LABPROT 14.2  INR 1.08   Studies/Results: Dg Chest 2 View  03/14/2011  IMPRESSION:  1.  Cardiomegaly and edema. 2.  Bilateral lower lobe opacities and effusions.    Ir Fluoro Guide Cv Line Right  03/15/2011  *RADIOLOGY REPORT*  Indication:  End-stage renal disease and needs hemodialysis.  PROCEDURE(S): FLUOROSCOPIC AND ULTRASOUND GUIDED PLACEMENT OF A TUNNELED DIAYSIS CATHETER  Physician:  Rachelle Hora. Henn, MD  Medications: Versed 2 mg, Fentanyl 100 mcg. A radiology nurse monitored the patient for moderate sedation.  As antibiotic prophylaxis, Ancef 1 gm was ordered pre-procedure and administered intravenously within one hour of incision.  Moderate sedation time: 25 minutes  Fluoroscopy time: 1.70minutes   Procedure:Informed consent was obtained for placement of a tunneled dialysis catheter.  The patient was placed supine on the interventional table.  Ultrasound confirmed a patent right internal jugularvein.  Ultrasound images were obtained for documentation. The right neck and chest were prepped and draped in a sterile fashion.  The right neck was anesthetized with 1% lidocaine. Maximal barrier sterile technique was utilized including caps, mask, sterile gowns, sterile gloves, sterile drape, hand hygiene and skin antiseptic.  A small incision was made with #11 blade scalpel.  A 21 gauge needle directed into the right internal jugular vein with ultrasound guidance.  A micropuncture dilator set was placed.  A 23 cm tip to cuff HemoSplit catheter was selected. The skin below the right clavicle was anesthetized and a small incision was made with an #11 blade scalpel.  A subcutaneous tunnel was formed to the vein dermatotomy site.  The catheter was brought through the tunnel.  The vein dermatotomy site was dilated to accommodate a peel-away sheath.  The catheter was placed through the peel-away sheath and directed into the central venous structures.  The tip of the catheter was placed at the cavoatrial junction with fluoroscopy.  Fluoroscopic images were obtained for documentation.  Both lumens were found to aspirate and flush well. The proper amount of heparin was flushed in both lumens.  The vein dermatotomy site was closed using a single layer of absorbable suture and Dermabond.  The catheter was secured to the skin using Prolene suture.  Findings:Catheter tip at the cavoatrial junction.  Complications: None  Impression:Successful placement of a right chest tunneled dialysis catheter using ultrasound and fluoroscopic guidance.  Original Report Authenticated By: Richarda Overlie, M.D.   Dg Chest Port 1 View  03/14/2011  IMPRESSION: Worsening CHF.    Assessment/Plan:  CKD Stage V/new start hemodialysis this  admission *HemoSplit catheter placed by interventional radiology on 03/15/2011 *Unable to utilize AV fistula due to immaturity *Tolerated initial hemodialysis treatment well-so far 5270 cc have been removed this admission  Mild systolic heart failure-EF 45%/Pulmonary edema *Secondary to volume overload from chronic kidney disease *Resolving with appropriate dialysis treatment  Chronic Anemia secondary to chronic kidney disease *Started on Aranesp this admission  Hypertension *Continue metoprolol and Norvasc *Monitor for continued need for antihypertensive agents once patient closer to dry weight *No evidence of cardiac ischemia so we'll discontinue nitroglycerin patch  DVT prophylaxis *Continue every 8 hours subcutaneous Heparin   Disposition *Transferred to [address] renal floor   LOS: 4 days   ELLIS,ALLISON L. [facility] Pager: [phone] 03/17/2011, 2:24 PM  I have personally examined this patient and reviewed the entire database. I have reviewed the above note, made any necessary editorial changes, and agree with its content.  Lonia Blood, MD [facility]

## 2011-03-18 ENCOUNTER — Inpatient Hospital Stay (HOSPITAL_COMMUNITY): Payer: Medicaid Other

## 2011-03-18 LAB — RENAL FUNCTION PANEL
BUN: 42 mg/dL — ABNORMAL HIGH (ref 6–23)
CO2: 24 mEq/L (ref 19–32)
Calcium: 7.2 mg/dL — ABNORMAL LOW (ref 8.4–10.5)
Calcium: 7.9 mg/dL — ABNORMAL LOW (ref 8.4–10.5)
Chloride: 100 mEq/L (ref 96–112)
Chloride: 100 mEq/L (ref 96–112)
Creatinine, Ser: 8.39 mg/dL — ABNORMAL HIGH (ref 0.50–1.35)
Creatinine, Ser: 8.56 mg/dL — ABNORMAL HIGH (ref 0.50–1.35)
GFR calc Af Amer: 7 mL/min — ABNORMAL LOW (ref >90)
Glucose, Bld: 81 mg/dL (ref 70–99)
Glucose, Bld: 157 mg/dL — ABNORMAL HIGH (ref 70–99)
Potassium: 4.2 mEq/L (ref 3.5–5.1)
Sodium: 136 mEq/L (ref 135–145)

## 2011-03-18 LAB — CBC
Hemoglobin: 10.4 g/dL — ABNORMAL LOW (ref 13.0–17.0)
MCH: 27.7 pg (ref 26.0–34.0)
MCV: 85.1 fL (ref 78.0–100.0)
RBC: 3.76 MIL/uL — ABNORMAL LOW (ref 4.22–5.81)
WBC: 10 10*3/uL (ref 4.0–10.5)

## 2011-03-18 LAB — IRON AND TIBC
Saturation Ratios: 10 % — ABNORMAL LOW (ref 20–55)
TIBC: 162 ug/dL — ABNORMAL LOW (ref 215–435)
UIBC: 145 ug/dL (ref 125–400)

## 2011-03-18 MED ORDER — DARBEPOETIN ALFA-POLYSORBATE 100 MCG/0.5ML IJ SOLN
INTRAMUSCULAR | Status: AC
  Filled 2011-03-18: qty 0.5

## 2011-03-18 MED ORDER — PARICALCITOL 5 MCG/ML IV SOLN
INTRAVENOUS | Status: AC
  Administered 2011-03-18: 1 ug via INTRAVENOUS
  Filled 2011-03-18: qty 1

## 2011-03-18 MED ORDER — DARBEPOETIN ALFA-POLYSORBATE 100 MCG/0.5ML IJ SOLN: 100.0000 ug | INTRAMUSCULAR | Status: DC

## 2011-03-18 NOTE — Progress Notes (Signed)
Patient ID: Jonathan Terry, male   DOB: 05/06/1955, 56 y.o.   MRN: [id number]  Subjective: No events overnight. Patient denies chest pain, shortness of breath, abdominal pain.   Objective:  Vital signs in last 24 hours:  Filed Vitals:   03/17/11 1342 03/17/11 2138 03/17/11 2315 03/18/11 0500  BP: 162/76 180/77 163/73 170/81  Pulse: 80 80  73  Temp: 99.4 F (37.4 C) 98.8 F (37.1 C)  99.3 F (37.4 C)  TempSrc: Oral Oral  Oral  Resp: 20 19  18   Height:      Weight:      SpO2: 96% 97%  97%    Intake/Output from previous day:   Intake/Output Summary (Last 24 hours) at 03/18/11 0856 Last data filed at 03/17/11 1500  Gross per 24 hour  Intake    240 ml  Output    100 ml  Net    140 ml    Physical Exam: General: Alert, awake, oriented x3, in no acute distress. HEENT: No bruits, no goiter. Moist mucous membranes, no scleral icterus, no conjunctival pallor. Heart: Regular rate and rhythm, S1/S2 +, no murmurs, rubs, gallops. Lungs: Clear to auscultation bilaterally. No wheezing, no rhonchi, no rales.  Abdomen: Soft, nontender, nondistended, positive bowel sounds. Extremities: No clubbing or cyanosis, no pitting edema,  positive pedal pulses. Neuro: Grossly nonfocal.  Lab Results:  Basic Metabolic Panel:    Component Value Date/Time   NA 136 03/18/2011 0545   K 4.2 03/18/2011 0545   CL 100 03/18/2011 0545   CO2 26 03/18/2011 0545   BUN 42* 03/18/2011 0545   CREATININE 8.39* 03/18/2011 0545   GLUCOSE 81 03/18/2011 0545   CALCIUM 7.2* 03/18/2011 0545   CBC:    Component Value Date/Time   WBC 9.6 03/15/2011 0830   HGB 9.8* 03/15/2011 0830   HCT 31.0* 03/15/2011 0830   PLT 259 03/15/2011 0830   MCV 86.4 03/15/2011 0830      Lab 03/15/11 0830 03/13/11 2332  WBC 9.6 9.8  HGB 9.8* 9.6*  HCT 31.0* 30.4*  PLT 259 270  MCV 86.4 86.4  MCH 27.3 27.3  MCHC 31.6 31.6  RDW 13.3 13.5  LYMPHSABS -- --  MONOABS -- --  EOSABS -- --  BASOSABS -- --  BANDABS -- --    Lab 03/18/11 0545  03/17/11 0415 03/16/11 1459 03/16/11 0405 03/15/11 0830  NA 136 138 135 135 139  K 4.2 3.6 4.2 4.0 4.9  CL 100 102 98 98 101  CO2 26 27 26 27 26   GLUCOSE 81 88 124* 131* 84  BUN 42* 37* 52* 50* 73*  CREATININE 8.39* 7.67* 9.33* 9.10* 12.08*  CALCIUM 7.2* 7.7* 7.3* 7.3* 7.7*  MG -- -- -- -- --    Lab 03/15/11 1009  INR 1.08  PROTIME --   Cardiac markers:  Lab 03/14/11 1151 03/14/11 0620 03/13/11 2331  CKMB 4.8* 5.0* 5.8*  TROPONINI <0.30 <0.30 <0.30  MYOGLOBIN -- -- --   No components found with this basename: POCBNP:3 Recent Results (from the past 240 hour(s))  URINE CULTURE     Status: Normal   Collection Time   03/14/11  5:40 AM      Component Value Range Status Comment   Specimen Description URINE, CATHETERIZED   Final    Special Requests NONE   Final    Culture  Setup Time [id number]   Final    Colony Count NO GROWTH   Final    Culture NO  GROWTH   Final    Report Status 03/15/2011 FINAL   Final   MRSA PCR SCREENING     Status: Normal   Collection Time   03/15/11  2:51 AM      Component Value Range Status Comment   MRSA by PCR NEGATIVE  NEGATIVE  Final     Studies/Results: No results found.  Medications: Scheduled Meds:   . amLODipine  5 mg Oral QHS  . aspirin EC  81 mg Oral Daily  . calcium carbonate  1 tablet Oral TID WC  . darbepoetin (ARANESP) injection - DIALYSIS  100 mcg Intravenous Q Tue-HD  . heparin  5,000 Units Subcutaneous Q8H  . metoprolol  50 mg Oral BID  . multivitamin  1 tablet Oral QHS  . paricalcitol  1 mcg Intravenous Q T,Th,Sa-HD  . DISCONTD: feeding supplement (NEPRO CARB STEADY)  237 mL Oral TID WC & HS  . DISCONTD: nitroGLYCERIN  0.4 mg Transdermal Daily   Continuous Infusions:  PRN Meds:.sodium chloride, sodium chloride, acetaminophen, ALPRAZolam, feeding supplement (NEPRO CARB STEADY), heparin, heparin, heparin, heparin, hydrALAZINE, lidocaine, lidocaine-prilocaine, ondansetron (ZOFRAN) IV,  pentafluoroprop-tetrafluoroeth  Assessment/Plan:  CKD Stage V/new start hemodialysis this admission  *HemoSplit catheter placed by interventional radiology on 03/15/2011  *Tolerated initial hemodialysis treatment well-so far 5270 cc have been removed this admission   Mild systolic heart failure-EF 45%/Pulmonary edema  *Secondary to volume overload from chronic kidney disease  *Resolved  Chronic Anemia secondary to chronic kidney disease  *Started on Aranesp this admission   Hypertension  *Continue metoprolol and Norvasc   DVT prophylaxis  *Continue every 8 hours subcutaneous Heparin    EDUCATION - test results and diagnostic studies were discussed with patient  - patient verbalized the understanding - questions were answered at the bedside and contact information was provided for additional questions or concerns   LOS: 5 days   MAGICK-Khyren Hing 03/18/2011, 8:56 AM  [facility] Pager: [phone]

## 2011-03-18 NOTE — Progress Notes (Signed)
Subjective: Interval History:  Felt a little SOB last night Feels good this AM For HD today Objective:  Vital signs in last 24 hours:  Temp:  [98.4 F (36.9 C)-99.4 F (37.4 C)] 98.9 F (37.2 C) (03/07 1010) Pulse Rate:  [65-85] 65  (03/07 1010) Resp:  [18-20] 20  (03/07 1010) BP: (152-180)/(68-81) 152/68 mmHg (03/07 1010) SpO2:  [96 %-98 %] 98 % (03/07 1010) Weight change:   Intake/Output: I/O last 3 completed shifts: In: 360 [P.O.:360] Out: 800 [Urine:800]  Intake/Output this shift:    Scheduled Meds Reviewed:  . amLODipine  5 mg Oral QHS  . aspirin EC  81 mg Oral Daily  . calcium carbonate  1 tablet Oral TID WC  . darbepoetin (ARANESP) injection - DIALYSIS  100 mcg Intravenous Q Tue-HD  . heparin  5,000 Units Subcutaneous Q8H  . metoprolol  50 mg Oral BID  . multivitamin  1 tablet Oral QHS  . paricalcitol  1 mcg Intravenous Q T,Th,Sa-HD  . DISCONTD: nitroGLYCERIN  0.4 mg Transdermal Daily   EXAM: Looks well Lungs clear to A Perm cath in place AVF left upper arm patent Abdomen soft and NT No edema of LE's Lab Results:  [facility] 03/18/11 0545 03/17/11 0415 03/16/11 1459  NA 136 138 135  K 4.2 3.6 4.2  CL 100 102 98  CO2 26 27 26   GLUCOSE 81 88 124*  BUN 42* 37* 52*  CREATININE 8.39* 7.67* 9.33*  CALCIUM 7.2* 7.7* 7.3*  PHOS 4.0 4.1 5.2*   LFT  Basename 03/18/11 0545  PROT --  ALBUMIN 1.6*  AST --  ALT --  ALKPHOS --  BILITOT --  BILIDIR --  IBILI --  PTH: Lab Results  Component Value Date   PTH 362.9* 01/26/2011   CALCIUM 7.2* 03/18/2011   PHOS 4.0 03/18/2011   Assessment/Plan: 1. New ESRD - will commit to long term dialysis; anticipate TTS schedule in [place] but unclear if they can take him before next Tuesday (problem with starting new patients on the weekend) so will likely have to stay until after HD on Sat.   Has immature AVF (created 01/2011) and PC. For HD today 2. S/P AVF left arm on Jan 17th- not ready to use, but is maturing well.  3.  HTN- on norvasc and metoprolol  4. MBD- CaCO3 binder;Zemplar with HD  5. Anemia- Hb 9's on weekly Aranesp and will check iron studies (last done 01/2011)  6. CHF due to volume overload - resolved    LOS: 5 Keiland Pickering B @TODAY @11 :35 AM

## 2011-03-19 LAB — RENAL FUNCTION PANEL
BUN: 19 mg/dL (ref 6–23)
CO2: 28 mEq/L (ref 19–32)
Chloride: 102 mEq/L (ref 96–112)
Creatinine, Ser: 5.18 mg/dL — ABNORMAL HIGH (ref 0.50–1.35)
Glucose, Bld: 90 mg/dL (ref 70–99)

## 2011-03-19 MED ORDER — NA FERRIC GLUC CPLX IN SUCROSE 12.5 MG/ML IV SOLN
125.0000 mg | INTRAVENOUS | Status: DC
  Administered 2011-03-20: 125 mg via INTRAVENOUS
  Filled 2011-03-19: qty 10

## 2011-03-19 MED ORDER — AMLODIPINE BESYLATE 10 MG PO TABS
10.0000 mg | ORAL_TABLET | Freq: Every day | ORAL | Status: DC
  Administered 2011-03-19: 10 mg via ORAL
  Filled 2011-03-19 (×2): qty 1

## 2011-03-19 NOTE — Progress Notes (Signed)
Subjective: Interval History:  Feels good, no complaints Eager to go home tomorrow after HD Objective: Vital signs in last 24 hours:  Temp:  [97.1 F (36.2 C)-100 F (37.8 C)] 98.5 F (36.9 C) (03/08 0911) Pulse Rate:  [64-96] 76  (03/08 0911) Resp:  [15-20] 20  (03/08 0500) BP: (144-200)/(68-99) 144/72 mmHg (03/08 0911) SpO2:  [96 %-99 %] 99 % (03/08 0500) Weight:  [74.2 kg (163 lb 9.3 oz)] 74.2 kg (163 lb 9.3 oz) (03/08 0008) Weight change:   EXAM: Looks well Lungs clear No rub No edema Left BC AVF good bruit and thrill Permcath non tender  Scheduled Meds Reviewed:  . amLODipine  5 mg Oral QHS  . aspirin EC  81 mg Oral Daily  . calcium carbonate  1 tablet Oral TID WC  . darbepoetin (ARANESP) injection - DIALYSIS  100 mcg Intravenous Q Thu-HD  . heparin  5,000 Units Subcutaneous Q8H  . metoprolol  50 mg Oral BID  . multivitamin  1 tablet Oral QHS  . paricalcitol  1 mcg Intravenous Q T,Th,Sa-HD   BMET  Basename 03/19/11 0545 03/18/11 1908 03/18/11 0545  NA 139 135 136  K 3.6 4.1 4.2  CL 102 100 100  CO2 28 24 26   GLUCOSE 90 157* 81  BUN 19 47* 42*  CREATININE 5.18* 8.56* 8.39*  CALCIUM 7.7* 7.9* 7.2*  PHOS 2.8 4.1 4.0   Basename 03/19/11 0545  PROT --  ALBUMIN 1.6*  AST --  ALT --  ALKPHOS --  BILITOT --  BILIDIR --  IBILI --   Basename 03/18/11 1908  HEPBSAG NEGATIVE  HCVAB --  HEPAIGM --  HEPBIGM --   PTH: Lab Results  Component Value Date   PTH 362.9* 01/26/2011   CALCIUM 7.7* 03/19/2011   PHOS 2.8 03/19/2011   Lab Results  Component Value Date   IRON 17* 03/18/2011   TIBC 162* 03/18/2011   FERRITIN 317 01/26/2011   Assessment/Plan:  1. New ESRD - will commit to long term dialysis; anticipate TTS schedule in [place] to start there next Tuesday can be D/C after dialysis on Saturday Has immature AVF (created 01/2011) and PC. EDW around 74 kg 2. S/P AVF left arm on Jan 17th- not ready to use, but is maturing well.  3. HTN- on norvasc and  metoprolol; increase norvasc to 10 QHS 4. MBD- CaCO3 binder;Zemplar with HD  5. Anemia- Hb 9's on weekly Aranesp; iron low; give weekly with HD after load of 5 doses (1st dose Saturday)  6. CHF due to volume overload - resolved     LOS: 6 Jonathan Terry B @TODAY @1 :57 PM

## 2011-03-19 NOTE — Progress Notes (Signed)
Patient ID: Jonathan Terry, male   DOB: 1955-10-31, 56 y.o.   MRN: [id number]  Subjective: No events overnight. Patient denies chest pain, shortness of breath, abdominal pain.   Objective:  Vital signs in last 24 hours:  Filed Vitals:   03/19/11 1400 03/19/11 1800 03/19/11 1900 03/19/11 2100  BP: 166/75 186/87 172/82 176/83  Pulse: 74 85 93 84  Temp: 98 F (36.7 C) 98.4 F (36.9 C)  98.4 F (36.9 C)  TempSrc: Oral Oral  Oral  Resp: 20 18 17 16   Height:      Weight:      SpO2: 98% 97%  96%    Intake/Output from previous day:   Intake/Output Summary (Last 24 hours) at 03/19/11 2150 Last data filed at 03/19/11 1900  Gross per 24 hour  Intake    720 ml  Output   2500 ml  Net  -1780 ml    Physical Exam: General: Alert, awake, oriented x3, in no acute distress. HEENT: No bruits, no goiter. Moist mucous membranes, no scleral icterus, no conjunctival pallor. Heart: Regular rate and rhythm, S1/S2 +, no murmurs, rubs, gallops. Lungs: Clear to auscultation bilaterally. No wheezing, no rhonchi, no rales.  Abdomen: Soft, nontender, nondistended, positive bowel sounds. Extremities: No clubbing or cyanosis, no pitting edema,  positive pedal pulses. Neuro: Grossly nonfocal.  Lab Results:  Basic Metabolic Panel:    Component Value Date/Time   NA 139 03/19/2011 0545   K 3.6 03/19/2011 0545   CL 102 03/19/2011 0545   CO2 28 03/19/2011 0545   BUN 19 03/19/2011 0545   CREATININE 5.18* 03/19/2011 0545   GLUCOSE 90 03/19/2011 0545   CALCIUM 7.7* 03/19/2011 0545   CBC:    Component Value Date/Time   WBC 10.0 03/18/2011 1908   HGB 10.4* 03/18/2011 1908   HCT 32.0* 03/18/2011 1908   PLT 247 03/18/2011 1908   MCV 85.1 03/18/2011 1908      Lab 03/18/11 1908 03/15/11 0830 03/13/11 2332  WBC 10.0 9.6 9.8  HGB 10.4* 9.8* 9.6*  HCT 32.0* 31.0* 30.4*  PLT 247 259 270  MCV 85.1 86.4 86.4  MCH 27.7 27.3 27.3  MCHC 32.5 31.6 31.6  RDW 13.2 13.3 13.5  LYMPHSABS -- -- --  MONOABS -- -- --  EOSABS --  -- --  BASOSABS -- -- --  BANDABS -- -- --    Lab 03/19/11 0545 03/18/11 1908 03/18/11 0545 03/17/11 0415 03/16/11 1459  NA 139 135 136 138 135  K 3.6 4.1 4.2 3.6 4.2  CL 102 100 100 102 98  CO2 28 24 26 27 26   GLUCOSE 90 157* 81 88 124*  BUN 19 47* 42* 37* 52*  CREATININE 5.18* 8.56* 8.39* 7.67* 9.33*  CALCIUM 7.7* 7.9* 7.2* 7.7* 7.3*  MG -- -- -- -- --    Lab 03/15/11 1009  INR 1.08  PROTIME --   Cardiac markers:  Lab 03/14/11 1151 03/14/11 0620 03/13/11 2331  CKMB 4.8* 5.0* 5.8*  TROPONINI <0.30 <0.30 <0.30  MYOGLOBIN -- -- --   No components found with this basename: POCBNP:3 Recent Results (from the past 240 hour(s))  URINE CULTURE     Status: Normal   Collection Time   03/14/11  5:40 AM      Component Value Range Status Comment   Specimen Description URINE, CATHETERIZED   Final    Special Requests NONE   Final    Culture  Setup Time [id number]   Final  Colony Count NO GROWTH   Final    Culture NO GROWTH   Final    Report Status 03/15/2011 FINAL   Final   MRSA PCR SCREENING     Status: Normal   Collection Time   03/15/11  2:51 AM      Component Value Range Status Comment   MRSA by PCR NEGATIVE  NEGATIVE  Final     Studies/Results: No results found.  Medications: Scheduled Meds:   . amLODipine  10 mg Oral QHS  . aspirin EC  81 mg Oral Daily  . calcium carbonate  1 tablet Oral TID WC  . darbepoetin (ARANESP) injection - DIALYSIS  100 mcg Intravenous Q Thu-HD  . ferric gluconate (FERRLECIT/NULECIT) IV  125 mg Intravenous Q T,Th,Sa-HD  . heparin  5,000 Units Subcutaneous Q8H  . metoprolol  50 mg Oral BID  . multivitamin  1 tablet Oral QHS  . paricalcitol  1 mcg Intravenous Q T,Th,Sa-HD  . DISCONTD: amLODipine  5 mg Oral QHS   Continuous Infusions:  PRN Meds:.sodium chloride, sodium chloride, acetaminophen, ALPRAZolam, feeding supplement (NEPRO CARB STEADY), heparin, heparin, hydrALAZINE, lidocaine, lidocaine-prilocaine, ondansetron (ZOFRAN) IV,  pentafluoroprop-tetrafluoroeth  Assessment/Plan:  CKD Stage V/new start hemodialysis this admission  *HemoSplit catheter placed by interventional radiology on 03/15/2011   Mild systolic heart failure-EF 45%/Pulmonary edema  *Secondary to volume overload from chronic kidney disease  *Resolved   Chronic Anemia secondary to chronic kidney disease  *Started on Aranesp this admission   Hypertension  *Continue metoprolol and Norvasc   DVT prophylaxis  *Continue every 8 hours subcutaneous Heparin   EDUCATION  - test results and diagnostic studies were discussed with patient  - patient verbalized the understanding  - questions were answered at the bedside and contact information was provided for additional questions or concerns   LOS: 6 days   MAGICK-Tayla Panozzo 03/19/2011, 9:50 PM  [facility] Pager: [phone]

## 2011-03-20 ENCOUNTER — Inpatient Hospital Stay (HOSPITAL_COMMUNITY): Payer: Medicaid Other

## 2011-03-20 LAB — CBC
HCT: 29.7 % — ABNORMAL LOW (ref 39.0–52.0)
Hemoglobin: 9.4 g/dL — ABNORMAL LOW (ref 13.0–17.0)
MCH: 27.1 pg (ref 26.0–34.0)
MCHC: 31.6 g/dL (ref 30.0–36.0)
MCV: 85.6 fL (ref 78.0–100.0)
RDW: 13.5 % (ref 11.5–15.5)

## 2011-03-20 LAB — RENAL FUNCTION PANEL
CO2: 27 mEq/L (ref 19–32)
Calcium: 7.9 mg/dL — ABNORMAL LOW (ref 8.4–10.5)
Creatinine, Ser: 6.2 mg/dL — ABNORMAL HIGH (ref 0.50–1.35)
GFR calc Af Amer: 10 mL/min — ABNORMAL LOW (ref >90)
Glucose, Bld: 80 mg/dL (ref 70–99)
Phosphorus: 3.5 mg/dL (ref 2.3–4.6)
Sodium: 138 mEq/L (ref 135–145)

## 2011-03-20 MED ORDER — PARICALCITOL 5 MCG/ML IV SOLN
INTRAVENOUS | Status: AC
  Administered 2011-03-20: 1 ug via INTRAVENOUS
  Filled 2011-03-20: qty 1

## 2011-03-20 MED ORDER — AZITHROMYCIN 250 MG PO TABS
250.0000 mg | ORAL_TABLET | Freq: Every day | ORAL | Status: DC
  Administered 2011-03-20: 250 mg via ORAL
  Filled 2011-03-20: qty 1

## 2011-03-20 MED ORDER — ALPRAZOLAM 0.25 MG PO TABS: 0.2500 mg | ORAL_TABLET | Freq: Two times a day (BID) | ORAL | Status: AC | PRN

## 2011-03-20 MED ORDER — RENA-VITE PO TABS: 1.0000 | ORAL_TABLET | Freq: Every day | ORAL | Status: DC

## 2011-03-20 MED ORDER — AZITHROMYCIN 250 MG PO TABS: 250.0000 mg | ORAL_TABLET | Freq: Every day | ORAL | Status: DC

## 2011-03-20 NOTE — Procedures (Signed)
I was present at this dialysis session.  I have reviewed the session itself and made appropriate changes.  Whyatt Klinger K. 3/9/20139:59 AM

## 2011-03-20 NOTE — Progress Notes (Signed)
Patient ID: Jonathan Terry, male   DOB: 1955-10-18, 56 y.o.   MRN: [id number]  S:Reports to be doing better today and excited at the possible DC later today   O: BP 138/74  Pulse 78  Temp(Src) 99.9 F (37.7 C) (Oral)  Resp 16  Ht 5\' 5"  (1.651 m)  Wt 74.2 kg (163 lb 9.3 oz)  BMI 27.22 kg/m2  SpO2 93%  JYN:[id number] on HD MVH:QIONG RRR, normal S1 and S2  Resp:CTA bilaterally, no rales/rhonchi EXB:MWUX, flat, NT, Bs normal  Ext:1+ edema bipedally.      Marland Kitchen amLODipine  10 mg Oral QHS  . aspirin EC  81 mg Oral Daily  . calcium carbonate  1 tablet Oral TID WC  . darbepoetin (ARANESP) injection - DIALYSIS  100 mcg Intravenous Q Thu-HD  . ferric gluconate (FERRLECIT/NULECIT) IV  125 mg Intravenous Q T,Th,Sa-HD  . heparin  5,000 Units Subcutaneous Q8H  . metoprolol  50 mg Oral BID  . multivitamin  1 tablet Oral QHS  . paricalcitol  1 mcg Intravenous Q T,Th,Sa-HD  . DISCONTD: amLODipine  5 mg Oral QHS    BMET  Lab 03/20/11 0700 03/19/11 0545 03/18/11 1908 03/18/11 0545 03/17/11 0415 03/16/11 1459 03/16/11 0405  NA 138 139 135 136 138 135 135  K 4.0 3.6 4.1 4.2 3.6 4.2 4.0  CL 102 102 100 100 102 98 98  CO2 27 28 24 26 27 26 27   GLUCOSE 80 90 157* 81 88 124* 131*  BUN 28* 19 47* 42* 37* 52* 50*  CREATININE 6.20* 5.18* 8.56* 8.39* 7.67* 9.33* 9.10*  ALB -- -- -- -- -- -- --  CALCIUM 7.9* 7.7* 7.9* 7.2* 7.7* 7.3* 7.3*  PHOS 3.5 2.8 4.1 4.0 4.1 5.2* 5.3*   CBC  Lab 03/20/11 0700 03/18/11 1908 03/15/11 0830 03/13/11 2332  WBC 10.1 10.0 9.6 9.8  NEUTROABS -- -- -- --  HGB 9.4* 10.4* 9.8* 9.6*  HCT 29.7* 32.0* 31.0* 30.4*  MCV 85.6 85.1 86.4 86.4  PLT 239 247 259 270    Assessment/Plan:  1. New ESRD - On TTS schedule in [place] to start there next Tuesday can be D/C after dialysis on Saturday. Has immature AVF (created 01/2011) and PC. EDW around 74 kg  2. S/P AVF left arm on Jan 17th- not ready to use, but is maturing well.  3. HTN- on norvasc and metoprolol; increase  norvasc to 10 QHS  4. MBD- CaCO3 binder;Zemplar with HD  5. Anemia- Hb 9's on weekly Aranesp; iron low; give weekly with HD after load of 5 doses (1st dose Saturday)  6. CHF due to volume overload - resolved   Leilan Bochenek K.

## 2011-03-20 NOTE — Discharge Summary (Addendum)
Patient ID: EDVIN ALBUS MRN: [id number] DOB/AGE: Aug 04, 1955 56 y.o.  Admit date: 03/13/2011 Discharge date: 03/20/2011  Primary Care Physician:  Provider Not In System  Discharge Diagnoses:    Present on Admission:  .Congestive heart failure .End stage renal disease .Renal failure (ARF), acute on chronic .Anemia .Hypertension  Principal Problem:  *Renal failure (ARF), acute on chronic Active Problems:  Congestive heart failure  Anemia  End stage renal disease  Hypertension   Medication List  As of 03/20/2011  7:46 AM   STOP taking these medications         furosemide 40 MG tablet      sodium bicarbonate 650 MG tablet         TAKE these medications         ALPRAZolam 0.25 MG tablet   Commonly known as: XANAX   Take 1 tablet (0.25 mg total) by mouth 2 (two) times daily as needed for anxiety.      amLODipine 5 MG tablet   Commonly known as: NORVASC   Take 5 mg by mouth at bedtime.      calcitRIOL 0.25 MCG capsule   Commonly known as: ROCALTROL   Take 0.25 mcg by mouth daily.      calcium carbonate 500 MG chewable tablet   Commonly known as: TUMS - dosed in mg elemental calcium   Chew 1 tablet by mouth 3 (three) times daily.      metoprolol 50 MG tablet   Commonly known as: LOPRESSOR   Take 50 mg by mouth 2 (two) times daily.      multivitamin Tabs tablet   Take 1 tablet by mouth at bedtime.      Vitamin D (Ergocalciferol) 50000 UNITS Caps   Commonly known as: DRISDOL   Take 50,000 Units by mouth every 7 (seven) days.            Disposition and Follow-up: Pt will continue HD in an outpatient setting Tuesday Thursday Saturday. He will also need to see his PCP in 4 weeks post discharge to ensure he remains compliant with HD.  Consults:  nephrology  Significant Diagnostic Studies:  Dg Chest 2 View 03/14/2011   IMPRESSION:  1.  Cardiomegaly and edema. 2.  Bilateral lower lobe opacities and effusions.     Brief H and P: 56 y.o. male has a past  medical history of Hypertension; Renal failure (ARF), acute on chronic; Congestive heart failure (01/29/2011); and Anemia (01/29/2011).  Transferred from [facility] with acute on Chronic renal failure with possible need for HD. Was here in January and was noted to have CKD with Cr 9.5 had Left arm fistula placed in January. He was supposed to start HD in April. Presented to [facility] with generalized swelling and SOB and his Cr was found to be elevated to 11.7 with K of 4.7 and he was transferred to [facility] for possible dialysis. Patient does endorse occasional chest pain but not currently. En route he received 40 IV of lasix with some urine output. He since had some improvement.  He has no known past medical history because he never went to the doctor. While he was here in January he was diagnosed with HTN and systolic CHF.   Physical Exam on Discharge:  Filed Vitals:   03/20/11 0600 03/20/11 0605 03/20/11 0630 03/20/11 0700  BP: 162/86 159/82 159/84 155/76  Pulse: 79 78 81 81  Temp: 99.9 F (37.7 C)     TempSrc: Oral  Resp: 20 16 16 16   Height:      Weight:      SpO2: 93%        Intake/Output Summary (Last 24 hours) at 03/20/11 0746 Last data filed at 03/19/11 1900  Gross per 24 hour  Intake    720 ml  Output    500 ml  Net    220 ml    General: Alert, awake, oriented x3, in no acute distress. HEENT: No bruits, no goiter. Heart: Regular rate and rhythm, without murmurs, rubs, gallops. Lungs: Clear to auscultation bilaterally. Abdomen: Soft, nontender, nondistended, positive bowel sounds. Extremities: No clubbing cyanosis or edema with positive pedal pulses. Neuro: Grossly intact, nonfocal.  CBC:    Component Value Date/Time   WBC 10.0 03/18/2011 1908   HGB 10.4* 03/18/2011 1908   HCT 32.0* 03/18/2011 1908   PLT 247 03/18/2011 1908   MCV 85.1 03/18/2011 1908    Basic Metabolic Panel:    Component Value Date/Time   NA 139 03/19/2011 0545   K 3.6 03/19/2011 0545   CL 102  03/19/2011 0545   CO2 28 03/19/2011 0545   BUN 19 03/19/2011 0545   CREATININE 5.18* 03/19/2011 0545   GLUCOSE 90 03/19/2011 0545   CALCIUM 7.7* 03/19/2011 0545    Hospital Course:  Principal Problem:  *Renal failure (ARF), acute on chronic - pt has started HD during this admission and please note that his AVF was placed January 2013 - pt has tolerated HD well and had no complications during the hospitalization - he will continue HD in [place] [facility] schedule and his appointments were scheduled - pt has responded well to the treatment  Active Problems:  Congestive heart failure - secondary to volume overload, acute systolic heart failure - EF 45 % based on last 2 D ECHO 2013 - pt started HD this hospitalization and has responded well to the treatment - his physical exam upon discharge showed clear lungs with no clinical signs of volume overload   Anemia - Hg and Hct have remained stable and at pt's baseline - no transfusions required  - plan of care and diagnosis, diagnostic studies and test results were discussed with pt - pt verbalized understanding  Time spent on Discharge: Over 30 minutes  Signed: Debbora Presto 03/20/2011, 7:46 AM  [facility] [phone]

## 2011-03-20 NOTE — Progress Notes (Signed)
Patient discharge instructions reviewed with patient. Patient verbalizes understanding. Patient states his ride is on the way.

## 2011-03-24 NOTE — Progress Notes (Signed)
Agree with above 

## 2011-04-27 DIAGNOSIS — N186 End stage renal disease: Secondary | ICD-10-CM

## 2011-06-11 ENCOUNTER — Other Ambulatory Visit: Payer: Self-pay | Admitting: *Deleted

## 2011-06-11 ENCOUNTER — Encounter (HOSPITAL_COMMUNITY): Payer: Self-pay | Admitting: Pharmacy Technician

## 2011-06-15 MED ORDER — DEXTROSE 5 % IV SOLN
1.5000 g | INTRAVENOUS | Status: AC
  Administered 2011-06-16: 1.5 g via INTRAVENOUS
  Filled 2011-06-15: qty 1.5

## 2011-06-15 MED ORDER — SODIUM CHLORIDE 0.9 % IV SOLN
INTRAVENOUS | Status: DC
  Administered 2011-06-16: 10 mL/h via INTRAVENOUS

## 2011-06-16 ENCOUNTER — Ambulatory Visit (HOSPITAL_COMMUNITY): Payer: Medicaid Other | Admitting: Anesthesiology

## 2011-06-16 ENCOUNTER — Ambulatory Visit (HOSPITAL_COMMUNITY): Payer: Medicaid Other

## 2011-06-16 ENCOUNTER — Encounter (HOSPITAL_COMMUNITY): Payer: Self-pay | Admitting: Anesthesiology

## 2011-06-16 ENCOUNTER — Encounter (HOSPITAL_COMMUNITY)
Admission: RE | Disposition: A | Discharge status: Home / Self Care | Payer: Self-pay | Source: Ambulatory Visit | Attending: Vascular Surgery

## 2011-06-16 ENCOUNTER — Encounter (HOSPITAL_COMMUNITY): Payer: Self-pay | Admitting: *Deleted

## 2011-06-16 ENCOUNTER — Ambulatory Visit (HOSPITAL_COMMUNITY)
Admission: RE | Admit: 2011-06-16 | Discharge: 2011-06-16 | Disposition: A | Discharge status: Home / Self Care | Payer: Medicaid Other | Source: Ambulatory Visit | Attending: Vascular Surgery | Admitting: Vascular Surgery

## 2011-06-16 DIAGNOSIS — I120 Hypertensive chronic kidney disease with stage 5 chronic kidney disease or end stage renal disease: Secondary | ICD-10-CM | POA: Insufficient documentation

## 2011-06-16 DIAGNOSIS — I509 Heart failure, unspecified: Secondary | ICD-10-CM | POA: Insufficient documentation

## 2011-06-16 DIAGNOSIS — I209 Angina pectoris, unspecified: Secondary | ICD-10-CM | POA: Insufficient documentation

## 2011-06-16 DIAGNOSIS — T82898A Other specified complication of vascular prosthetic devices, implants and grafts, initial encounter: Secondary | ICD-10-CM

## 2011-06-16 DIAGNOSIS — Y832 Surgical operation with anastomosis, bypass or graft as the cause of abnormal reaction of the patient, or of later complication, without mention of misadventure at the time of the procedure: Secondary | ICD-10-CM | POA: Insufficient documentation

## 2011-06-16 DIAGNOSIS — N186 End stage renal disease: Secondary | ICD-10-CM | POA: Insufficient documentation

## 2011-06-16 DIAGNOSIS — K219 Gastro-esophageal reflux disease without esophagitis: Secondary | ICD-10-CM | POA: Insufficient documentation

## 2011-06-16 HISTORY — DX: Gastro-esophageal reflux disease without esophagitis: K21.9

## 2011-06-16 HISTORY — DX: Angina pectoris, unspecified: I20.9

## 2011-06-16 HISTORY — DX: Dizziness and giddiness: R42

## 2011-06-16 LAB — POCT I-STAT, CHEM 8
BUN: 50 mg/dL — ABNORMAL HIGH (ref 6–23)
Calcium, Ion: 1.06 mmol/L — ABNORMAL LOW (ref 1.12–1.32)
Chloride: 100 mEq/L (ref 96–112)
HCT: 37 % — ABNORMAL LOW (ref 39.0–52.0)
Sodium: 137 mEq/L (ref 135–145)
TCO2: 29 mmol/L (ref 0–100)

## 2011-06-16 LAB — SURGICAL PCR SCREEN
MRSA, PCR: NEGATIVE
Staphylococcus aureus: NEGATIVE

## 2011-06-16 LAB — PROTIME-INR: Prothrombin Time: 13.9 seconds (ref 11.6–15.2)

## 2011-06-16 SURGERY — LIGATION OF COMPETING BRANCHES OF ARTERIOVENOUS FISTULA
Anesthesia: Monitor Anesthesia Care | Site: Arm Upper | Laterality: Left

## 2011-06-16 MED ORDER — 0.9 % SODIUM CHLORIDE (POUR BTL) OPTIME
TOPICAL | Status: DC | PRN
  Administered 2011-06-16: 1000 mL

## 2011-06-16 MED ORDER — PROPOFOL 10 MG/ML IV EMUL
INTRAVENOUS | Status: DC | PRN
  Administered 2011-06-16: 10 mg via INTRAVENOUS

## 2011-06-16 MED ORDER — FENTANYL CITRATE 0.05 MG/ML IJ SOLN: 25.0000 ug | INTRAMUSCULAR | Status: DC | PRN

## 2011-06-16 MED ORDER — MUPIROCIN 2 % EX OINT
TOPICAL_OINTMENT | CUTANEOUS | Status: AC
  Administered 2011-06-16: 10:00:00
  Filled 2011-06-16: qty 22

## 2011-06-16 MED ORDER — SODIUM CHLORIDE 0.9 % IV SOLN
INTRAVENOUS | Status: DC | PRN
  Administered 2011-06-16: 11:00:00 via INTRAVENOUS

## 2011-06-16 MED ORDER — MIDAZOLAM HCL 5 MG/5ML IJ SOLN
INTRAMUSCULAR | Status: DC | PRN
  Administered 2011-06-16: 1 mg via INTRAVENOUS

## 2011-06-16 MED ORDER — MUPIROCIN 2 % EX OINT
TOPICAL_OINTMENT | Freq: Two times a day (BID) | CUTANEOUS | Status: DC
  Filled 2011-06-16: qty 22

## 2011-06-16 MED ORDER — PROPOFOL 10 MG/ML IV EMUL
INTRAVENOUS | Status: DC | PRN
  Administered 2011-06-16: 25 ug/kg/min via INTRAVENOUS

## 2011-06-16 MED ORDER — FENTANYL CITRATE 0.05 MG/ML IJ SOLN
INTRAMUSCULAR | Status: DC | PRN
  Administered 2011-06-16: 50 ug via INTRAVENOUS
  Administered 2011-06-16: 25 ug via INTRAVENOUS

## 2011-06-16 MED ORDER — LIDOCAINE-EPINEPHRINE (PF) 1 %-1:200000 IJ SOLN
INTRAMUSCULAR | Status: DC | PRN
  Administered 2011-06-16: 10 mL via INTRADERMAL

## 2011-06-16 MED ORDER — OXYCODONE HCL 5 MG PO TABS: 5.0000 mg | ORAL_TABLET | ORAL | Status: AC | PRN

## 2011-06-16 SURGICAL SUPPLY — 34 items
CANISTER SUCTION 2500CC (MISCELLANEOUS) ×2 IMPLANT
CLIP TI MEDIUM 6 (CLIP) IMPLANT
CLIP TI WIDE RED SMALL 6 (CLIP) IMPLANT
CLOTH BEACON ORANGE TIMEOUT ST (SAFETY) ×2 IMPLANT
COVER SURGICAL LIGHT HANDLE (MISCELLANEOUS) ×4 IMPLANT
DERMABOND ADHESIVE PROPEN (GAUZE/BANDAGES/DRESSINGS) ×1
DERMABOND ADVANCED (GAUZE/BANDAGES/DRESSINGS) ×1
DERMABOND ADVANCED .7 DNX12 (GAUZE/BANDAGES/DRESSINGS) ×1 IMPLANT
DERMABOND ADVANCED .7 DNX6 (GAUZE/BANDAGES/DRESSINGS) ×1 IMPLANT
ELECT REM PT RETURN 9FT ADLT (ELECTROSURGICAL) ×2
ELECTRODE REM PT RTRN 9FT ADLT (ELECTROSURGICAL) ×1 IMPLANT
GEL ULTRASOUND 20GR AQUASONIC (MISCELLANEOUS) IMPLANT
GLOVE BIO SURGEON STRL SZ 6.5 (GLOVE) ×2 IMPLANT
GLOVE BIOGEL PI IND STRL 6.5 (GLOVE) ×1 IMPLANT
GLOVE BIOGEL PI INDICATOR 6.5 (GLOVE) ×1
GLOVE SS BIOGEL STRL SZ 7 (GLOVE) ×1 IMPLANT
GLOVE SUPERSENSE BIOGEL SZ 7 (GLOVE) ×1
GOWN STRL NON-REIN LRG LVL3 (GOWN DISPOSABLE) ×4 IMPLANT
KIT BASIN OR (CUSTOM PROCEDURE TRAY) ×2 IMPLANT
KIT ROOM TURNOVER OR (KITS) ×2 IMPLANT
NS IRRIG 1000ML POUR BTL (IV SOLUTION) ×2 IMPLANT
PACK CV ACCESS (CUSTOM PROCEDURE TRAY) ×2 IMPLANT
PAD ARMBOARD 7.5X6 YLW CONV (MISCELLANEOUS) ×4 IMPLANT
SPONGE GAUZE 4X4 12PLY (GAUZE/BANDAGES/DRESSINGS) ×2 IMPLANT
SUT PROLENE 6 0 BV (SUTURE) IMPLANT
SUT SILK 0 TIES 10X30 (SUTURE) ×2 IMPLANT
SUT VIC AB 3-0 SH 27 (SUTURE) ×2
SUT VIC AB 3-0 SH 27X BRD (SUTURE) ×2 IMPLANT
SWAB COLLECTION DEVICE MRSA (MISCELLANEOUS) IMPLANT
TOWEL OR 17X24 6PK STRL BLUE (TOWEL DISPOSABLE) ×2 IMPLANT
TOWEL OR 17X26 10 PK STRL BLUE (TOWEL DISPOSABLE) ×2 IMPLANT
TUBE ANAEROBIC SPECIMEN COL (MISCELLANEOUS) IMPLANT
UNDERPAD 30X30 INCONTINENT (UNDERPADS AND DIAPERS) ×2 IMPLANT
WATER STERILE IRR 1000ML POUR (IV SOLUTION) ×2 IMPLANT

## 2011-06-16 NOTE — Transfer of Care (Signed)
Immediate Anesthesia Transfer of Care Note  Patient: Jonathan Terry  Procedure(s) Performed: Procedure(s) (LRB): LIGATION OF COMPETING BRANCHES OF ARTERIOVENOUS FISTULA (Left)  Patient Location: PACU  Anesthesia Type: MAC  Level of Consciousness: awake, alert  and oriented  Airway & Oxygen Therapy: Patient Spontanous Breathing and Patient connected to nasal cannula oxygen  Post-op Assessment: Report given to PACU RN, Post -op Vital signs reviewed and stable and Patient moving all extremities  Post vital signs: Reviewed and stable  Complications: No apparent anesthesia complications

## 2011-06-16 NOTE — Op Note (Signed)
OPERATIVE REPORT  Date of Surgery: 06/16/2011  Surgeon: Josephina Gip, MD  Assistant: Nurse  Pre-op Diagnosis: End stage renal disease with 3 competing branches left brachial cephalic AV fistula Post-op Diagnosis-same Procedure: Procedure(s): LIGATION OF COMPETING BRANCHES OF ARTERIOVENOUS FISTULA-left brachial Anesthesia: General  EBL: 0  Complications: None  Procedure Details: The patient was taken to the operating room placed in the supine position at which time left upper extremity was prepped with Betadine scrub and solution draped in routine sterile manner. Using the SonoSite ultrasound of the competing branches were marked. The largest of these is a bifid branch originating just proximal to the antecubital space heading down into the forearm. Both main trunks were marked and the third competing branches in the mid aspect of the upper arm extending laterally. After infiltration with 1% Xylocaine with epinephrine short longitudinal incisions were made over these 3 branches each ligated with 2-0 silk ties. Adequate hemostasis achieved wounds closed in layers with Vicryl in subcuticular fashion with Dermabond patient taken to recovery in stable condition he had an excellent pulse and thrill in the fistula conclusion of the procedure   Josephina Gip, MD 06/16/2011 12:59 PM

## 2011-06-16 NOTE — Consult Note (Signed)
  This patient had a left brachial-cephalic AV fistula created by me on 01/28/2011. Patient is still not on hemodialysis. He was seen one month later with the fistula maturing. Recently a fistulogram was ordered by Dr. Caryn Section and this revealed 2 large competing branches in the mid portion of the left upper arm originating from the cephalic vein. He is scheduled now for ligation of these to competing branches.

## 2011-06-16 NOTE — Anesthesia Postprocedure Evaluation (Signed)
  Anesthesia Post-op Note  Patient: Jonathan Terry  Procedure(s) Performed: Procedure(s) (LRB): LIGATION OF COMPETING BRANCHES OF ARTERIOVENOUS FISTULA (Left)  Patient Location: PACU  Anesthesia Type: MAC  Level of Consciousness: awake  Airway and Oxygen Therapy: Patient Spontanous Breathing and Patient connected to nasal cannula oxygen  Post-op Pain: none  Post-op Assessment: Post-op Vital signs reviewed, Patient's Cardiovascular Status Stable, Respiratory Function Stable, Patent Airway and No signs of Nausea or vomiting  Post-op Vital Signs: Reviewed and stable  Complications: No apparent anesthesia complications

## 2011-06-16 NOTE — Progress Notes (Signed)
Lenny Pastel, Renal PA notified of K = 5.1.  No new orders.

## 2011-06-16 NOTE — Anesthesia Preprocedure Evaluation (Addendum)
Anesthesia Evaluation  Patient identified by MRN, date of birth, ID band Patient awake    Reviewed: Allergy & Precautions, H&P , NPO status , Patient's Chart, lab work & pertinent test results, reviewed documented beta blocker date and time   Airway Mallampati: II TM Distance: >3 FB Neck ROM: Full    Dental No notable dental hx. (+) Missing, Poor Dentition and Dental Advisory Given   Pulmonary neg pulmonary ROS,  breath sounds clear to auscultation  Pulmonary exam normal       Cardiovascular hypertension, On Home Beta Blockers + angina +CHF Rhythm:Regular Rate:Normal     Neuro/Psych negative neurological ROS  negative psych ROS   GI/Hepatic Neg liver ROS, GERD-  Controlled,  Endo/Other  negative endocrine ROS  Renal/GU Dialysis and ESRFRenal disease  negative genitourinary   Musculoskeletal   Abdominal   Peds  Hematology negative hematology ROS (+)   Anesthesia Other Findings   Reproductive/Obstetrics negative OB ROS                          Anesthesia Physical Anesthesia Plan  ASA: III  Anesthesia Plan: MAC   Post-op Pain Management:    Induction: Intravenous  Airway Management Planned: Simple Face Mask  Additional Equipment:   Intra-op Plan:   Post-operative Plan:   Informed Consent: I have reviewed the patients History and Physical, chart, labs and discussed the procedure including the risks, benefits and alternatives for the proposed anesthesia with the patient or authorized representative who has indicated his/her understanding and acceptance.   Dental advisory given and Dental Advisory Given  Plan Discussed with: CRNA, Anesthesiologist and Surgeon  Anesthesia Plan Comments:        Anesthesia Quick Evaluation

## 2011-07-09 NOTE — H&P (Signed)
Vascular Surgery H&P  Chief Complaint: Failure of left upper arm AV fistula to mature due to competing branches  HPI: Jonathan Terry is a 56 y.o. male who presents for evaluation of competing branches left upper arm AV fistula. Fistula was created January 12-brachial-cephalic. It has failed to mature adequately. Fistulogram performed revealed to competing branches and he is presenting now for ligation of these branches   Past Medical History  Diagnosis Date  . Hypertension   . Renal failure (ARF), acute on chronic   . Anginal pain     none recently since started dialysis  . Congestive heart failure 01/29/2011    none since on dialysis  . GERD (gastroesophageal reflux disease)   . Anemia 01/29/2011  . Dizziness 606/05/13   Past Surgical History  Procedure Date  . Av fistula placement 01/28/2011    Procedure: ARTERIOVENOUS (AV) FISTULA CREATION;  Surgeon: Pryor Ochoa, MD;  Location: [facility] OR;  Service: Vascular;  Laterality: Left;  . Av fistula placement 01/28/2011    Procedure: ARTERIOVENOUS (AV) FISTULA CREATION;  Surgeon: Chuck Hint, MD;  Location: [facility] OR;  Service: Vascular;;   History   Social History  . Marital Status: Divorced    Spouse Name: N/A    Number of Children: N/A  . Years of Education: N/A   Occupational History  . unemployed    Social History Main Topics  . Smoking status: Former Smoker -- 35 years    Types: Cigarettes  . Smokeless tobacco: Former Neurosurgeon    Quit date: 01/26/2008  . Alcohol Use: No  . Drug Use: No  . Sexually Active: No   Other Topics Concern  . Not on file   Social History Narrative   Live alone. From [place]. No wife or children. Unemployed for the past 3 months. Use to work in Glass blower/designer.    Family History  Problem Relation Age of Onset  . Kidney disease Father    No Known Allergies Prior to Admission medications   Medication Sig Start Date End Date Taking? Authorizing Provider  calcium carbonate (TUMS -  DOSED IN MG ELEMENTAL CALCIUM) 500 MG chewable tablet Chew 1 tablet by mouth 3 (three) times daily as needed. As needed for indigestion. 01/29/11 01/29/12 Yes Novlet Adelina Mings, MD  metoprolol tartrate (LOPRESSOR) 25 MG tablet Take 25 mg by mouth at bedtime.   Yes Historical Provider, MD  Multiple Vitamins-Minerals (CENTRUM SILVER PO) Take 1 tablet by mouth daily.   Yes Historical Provider, MD  traZODone (DESYREL) 50 MG tablet Take 50 mg by mouth at bedtime.   Yes Historical Provider, MD      All other systems have been reviewed and were otherwise negative with the exception of those mentioned in the HPI and as above.  Physical Exam: Filed Vitals:   06/16/11 1428  BP:   Pulse: 69  Temp: 97 F (36.1 C)  Resp: 11    General: Alert, no acute distress HEENT: Normal for age Cardiovascular: Regular rate and rhythm. Carotid pulses 2+, no bruits audible Respiratory: Clear to auscultation. No cyanosis, no use of accessory musculature GI: No organomegaly, abdomen is soft and non-tender Skin: No lesions in the area of chief complaint Neurologic: Sensation intact distally Psychiatric: Patient is competent for consent with normal mood and affect Musculoskeletal: No obvious deformities Extremities: Left upper arm fistula with good pulse and palpable thrill   Imaging reviewed:  fistulogram left arm reviewed which reveals competing branches   Assessment/Plan:  Ligation of competing  branches left upper arm AV fistula on 06/16/2011   Josephina Gip, MD 07/09/2011 10:09 AM

## 2012-04-19 IMAGING — CR DG CHEST 1V PORT
1 series · 1 of 1 positions shown · non-contrast
Comparison: Portable exam 4715 hours compared to 0036 hours

CLINICAL DATA: Dyspnea, question worsening CHF

PORTABLE CHEST - 1 VIEW

[view not recorded]
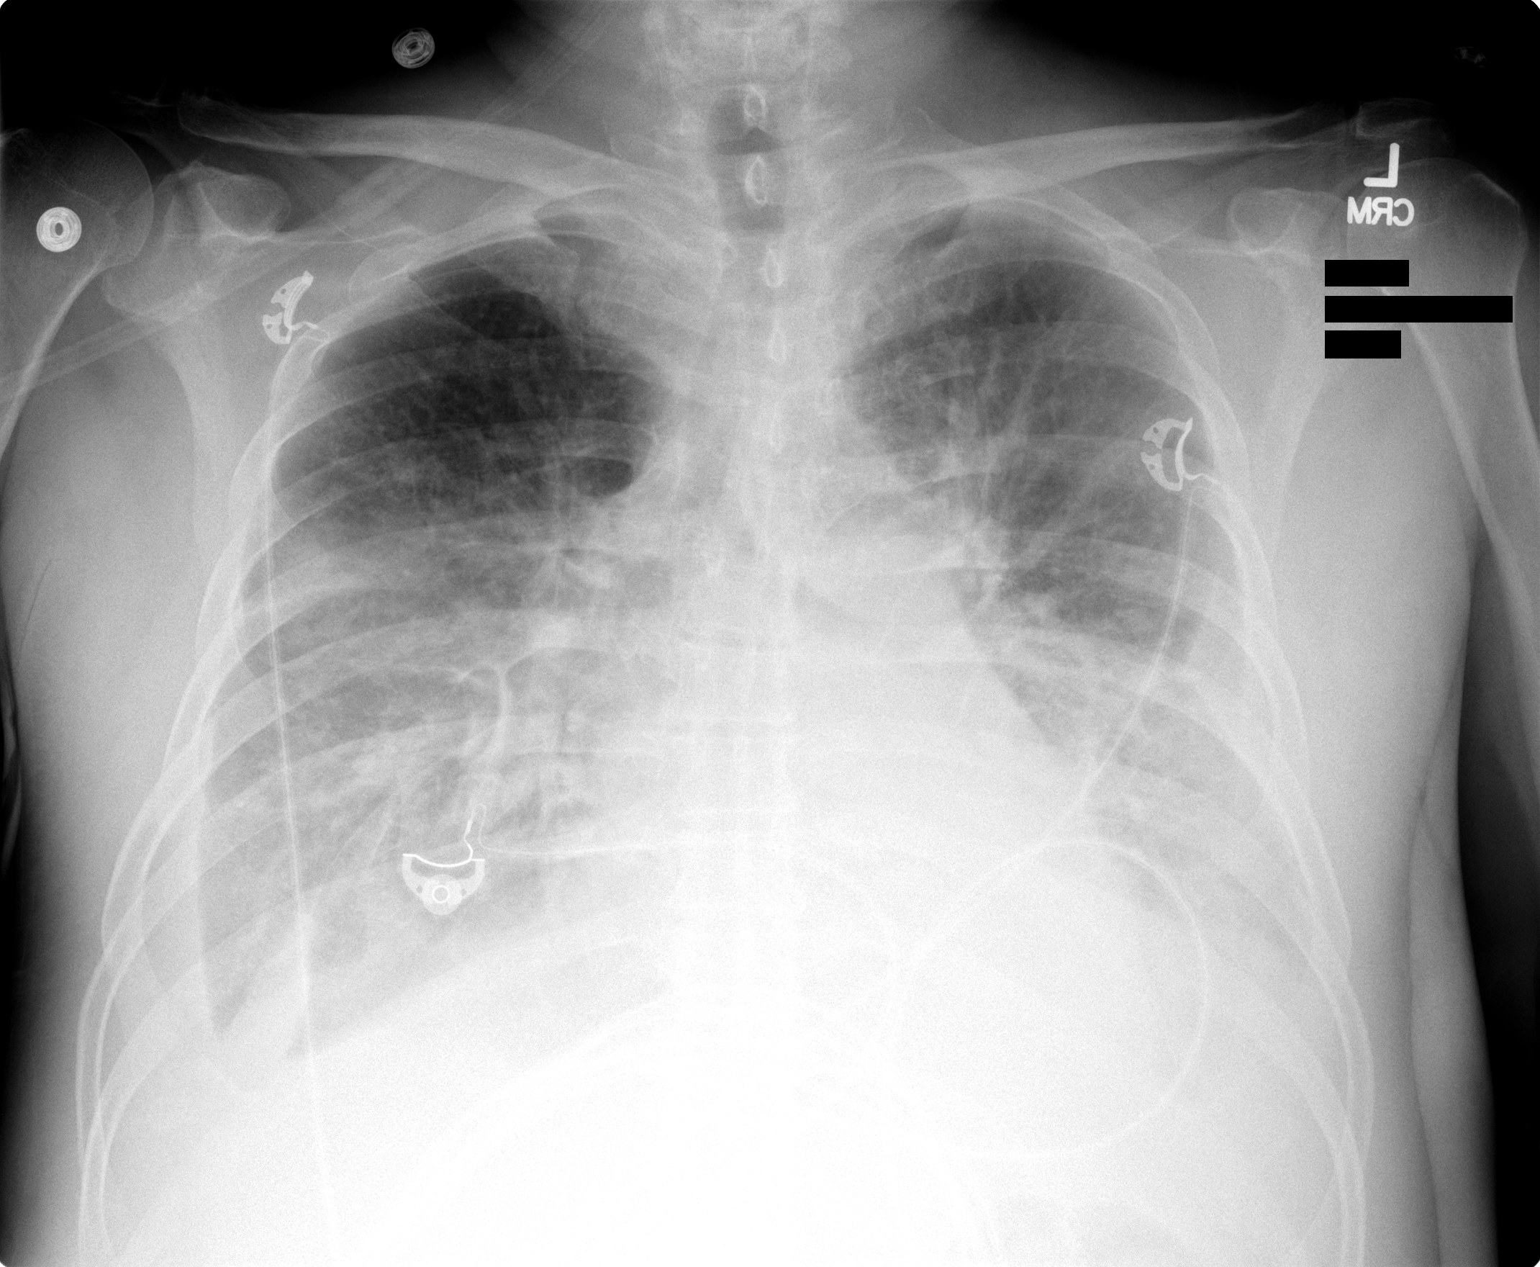

[1 of 1 positions shown; findings below may reference images not displayed]

FINDINGS: Enlargement of cardiac silhouette with pulmonary vascular
congestion.
Increased pulmonary edema and pleural effusions compatible with
worsening CHF.
No pneumothorax.
Lordotic positioning.
IMPRESSION: Worsening CHF.

## 2012-04-20 IMAGING — XA IR US GUIDE VASC ACCESS RIGHT
1 series · 1 of 1 positions shown · non-contrast
Comparison: none

INDICATION: End-stage renal disease and needs hemodialysis.

[Series 1: single · 1 of 1 slices shown]
[im 1/1]
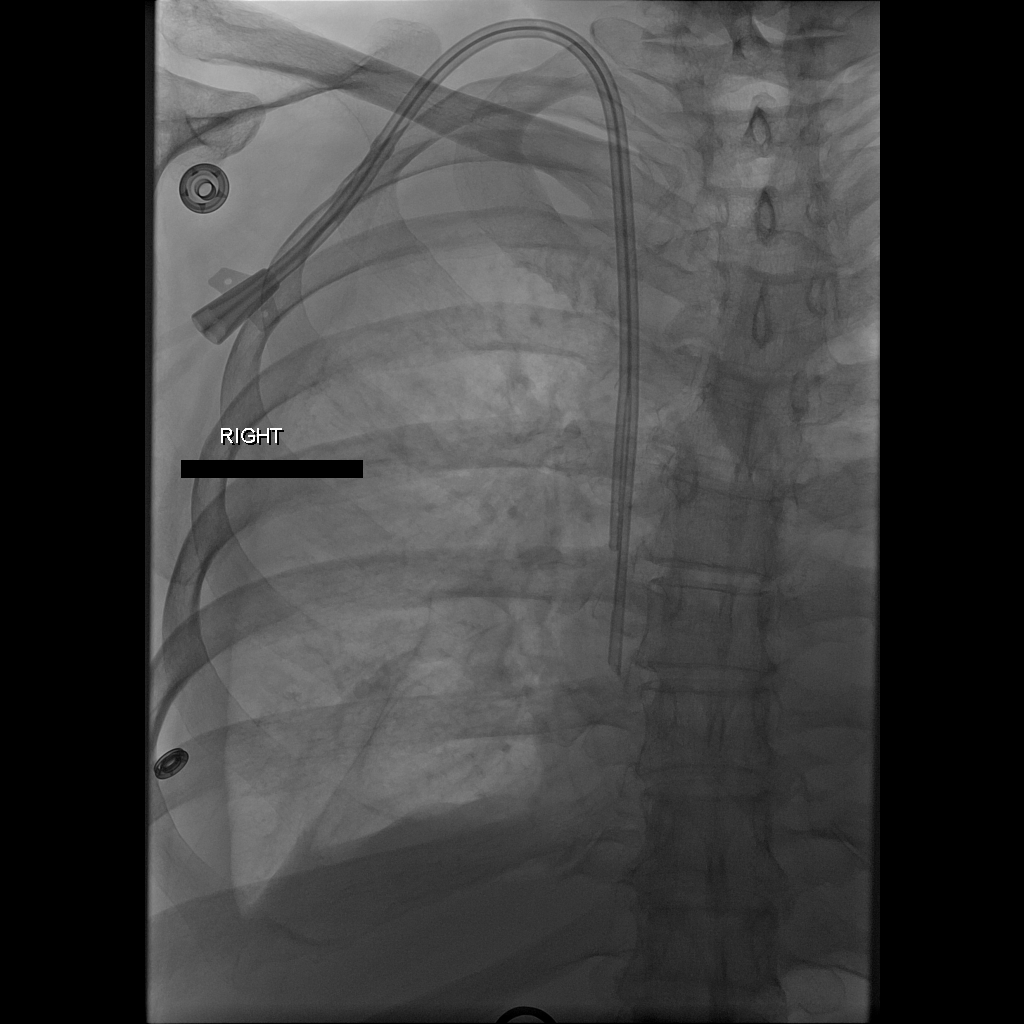

[1 of 1 positions shown; findings below may reference images not displayed]

PROCEDURE(S): FLUOROSCOPIC AND ULTRASOUND GUIDED PLACEMENT OF A
TUNNELED DIAYSIS CATHETER

Medications: Versed 2 mg, Fentanyl 100 mcg. A radiology nurse
monitored the patient for moderate sedation.

As antibiotic prophylaxis, Ancef 1 gm was ordered pre-procedure and
administered intravenously within one hour of incision.

Moderate sedation time: 25 minutes

Fluoroscopy time: 1.1minutes

Procedure:Informed consent was obtained for placement of a tunneled
dialysis catheter.  The patient was placed supine on the
interventional table.  Ultrasound confirmed a patent right internal
jugularvein.  Ultrasound images were obtained for documentation.
The right neck and chest were prepped and draped in a sterile
fashion.  The right neck was anesthetized with 1% lidocaine.
Maximal barrier sterile technique was utilized including caps,
mask, sterile gowns, sterile gloves, sterile drape, hand hygiene
and skin antiseptic.  A small incision was made with #11 blade
scalpel.  A 21 gauge needle directed into the right internal
jugular vein with ultrasound guidance.  A micropuncture dilator set
was placed.  A 23 cm tip to cuff HemoSplit catheter was selected.
The skin below the right clavicle was anesthetized and a small
incision was made with an #11 blade scalpel.  A subcutaneous tunnel
was formed to the vein dermatotomy site.  The catheter was brought
through the tunnel.  The vein dermatotomy site was dilated to
accommodate a peel-away sheath.  The catheter was placed through
the peel-away sheath and directed into the central venous
structures.  The tip of the catheter was placed at the cavoatrial
junction with fluoroscopy.  Fluoroscopic images were obtained for
documentation.  Both lumens were found to aspirate and flush well.
The proper amount of heparin was flushed in both lumens.  The vein
dermatotomy site was closed using a single layer of absorbable
suture and Dermabond.  The catheter was secured to the skin using
Prolene suture.
FINDINGS: Catheter tip at the cavoatrial junction.

Complications: None
IMPRESSION: Successful placement of a right chest tunneled dialysis
catheter using ultrasound and fluoroscopic guidance.

## 2016-06-14 ENCOUNTER — Emergency Department (HOSPITAL_COMMUNITY): Payer: Medicare Other

## 2016-06-14 ENCOUNTER — Inpatient Hospital Stay (HOSPITAL_COMMUNITY): Payer: Medicare Other

## 2016-06-14 ENCOUNTER — Inpatient Hospital Stay (HOSPITAL_COMMUNITY)
Admission: EM | Admit: 2016-06-14 | Discharge: 2016-06-20 | DRG: 286 | Disposition: A | Discharge status: Home / Self Care | Payer: Medicare Other | Attending: Internal Medicine | Admitting: Internal Medicine

## 2016-06-14 ENCOUNTER — Encounter (HOSPITAL_COMMUNITY): Payer: Self-pay | Admitting: Cardiology

## 2016-06-14 DIAGNOSIS — I5043 Acute on chronic combined systolic (congestive) and diastolic (congestive) heart failure: Secondary | ICD-10-CM | POA: Diagnosis not present

## 2016-06-14 DIAGNOSIS — J9690 Respiratory failure, unspecified, unspecified whether with hypoxia or hypercapnia: Secondary | ICD-10-CM

## 2016-06-14 DIAGNOSIS — I340 Nonrheumatic mitral (valve) insufficiency: Secondary | ICD-10-CM

## 2016-06-14 DIAGNOSIS — E875 Hyperkalemia: Secondary | ICD-10-CM | POA: Diagnosis present

## 2016-06-14 DIAGNOSIS — I272 Pulmonary hypertension, unspecified: Secondary | ICD-10-CM | POA: Diagnosis not present

## 2016-06-14 DIAGNOSIS — I2510 Atherosclerotic heart disease of native coronary artery without angina pectoris: Secondary | ICD-10-CM | POA: Diagnosis present

## 2016-06-14 DIAGNOSIS — I10 Essential (primary) hypertension: Secondary | ICD-10-CM | POA: Diagnosis present

## 2016-06-14 DIAGNOSIS — I132 Hypertensive heart and chronic kidney disease with heart failure and with stage 5 chronic kidney disease, or end stage renal disease: Secondary | ICD-10-CM | POA: Diagnosis present

## 2016-06-14 DIAGNOSIS — G931 Anoxic brain damage, not elsewhere classified: Secondary | ICD-10-CM | POA: Diagnosis present

## 2016-06-14 DIAGNOSIS — Z5329 Procedure and treatment not carried out because of patient's decision for other reasons: Secondary | ICD-10-CM | POA: Diagnosis not present

## 2016-06-14 DIAGNOSIS — T68XXXA Hypothermia, initial encounter: Secondary | ICD-10-CM

## 2016-06-14 DIAGNOSIS — Z87891 Personal history of nicotine dependence: Secondary | ICD-10-CM | POA: Diagnosis not present

## 2016-06-14 DIAGNOSIS — Z992 Dependence on renal dialysis: Secondary | ICD-10-CM | POA: Diagnosis not present

## 2016-06-14 DIAGNOSIS — I2729 Other secondary pulmonary hypertension: Secondary | ICD-10-CM | POA: Diagnosis present

## 2016-06-14 DIAGNOSIS — N2581 Secondary hyperparathyroidism of renal origin: Secondary | ICD-10-CM | POA: Diagnosis present

## 2016-06-14 DIAGNOSIS — K219 Gastro-esophageal reflux disease without esophagitis: Secondary | ICD-10-CM | POA: Diagnosis present

## 2016-06-14 DIAGNOSIS — R680 Hypothermia, not associated with low environmental temperature: Secondary | ICD-10-CM | POA: Diagnosis present

## 2016-06-14 DIAGNOSIS — Z8673 Personal history of transient ischemic attack (TIA), and cerebral infarction without residual deficits: Secondary | ICD-10-CM | POA: Diagnosis not present

## 2016-06-14 DIAGNOSIS — J9601 Acute respiratory failure with hypoxia: Secondary | ICD-10-CM | POA: Diagnosis present

## 2016-06-14 DIAGNOSIS — I5082 Biventricular heart failure: Secondary | ICD-10-CM | POA: Diagnosis present

## 2016-06-14 DIAGNOSIS — R4020 Unspecified coma: Secondary | ICD-10-CM | POA: Diagnosis not present

## 2016-06-14 DIAGNOSIS — R0602 Shortness of breath: Secondary | ICD-10-CM | POA: Diagnosis not present

## 2016-06-14 DIAGNOSIS — Z7982 Long term (current) use of aspirin: Secondary | ICD-10-CM

## 2016-06-14 DIAGNOSIS — J449 Chronic obstructive pulmonary disease, unspecified: Secondary | ICD-10-CM | POA: Diagnosis present

## 2016-06-14 DIAGNOSIS — R001 Bradycardia, unspecified: Secondary | ICD-10-CM | POA: Diagnosis present

## 2016-06-14 DIAGNOSIS — I462 Cardiac arrest due to underlying cardiac condition: Secondary | ICD-10-CM | POA: Diagnosis present

## 2016-06-14 DIAGNOSIS — R402112 Coma scale, eyes open, never, at arrival to emergency department: Secondary | ICD-10-CM | POA: Diagnosis present

## 2016-06-14 DIAGNOSIS — R402312 Coma scale, best motor response, none, at arrival to emergency department: Secondary | ICD-10-CM | POA: Diagnosis present

## 2016-06-14 DIAGNOSIS — M898X9 Other specified disorders of bone, unspecified site: Secondary | ICD-10-CM | POA: Diagnosis present

## 2016-06-14 DIAGNOSIS — I5021 Acute systolic (congestive) heart failure: Secondary | ICD-10-CM | POA: Diagnosis present

## 2016-06-14 DIAGNOSIS — J9811 Atelectasis: Secondary | ICD-10-CM | POA: Diagnosis present

## 2016-06-14 DIAGNOSIS — I25110 Atherosclerotic heart disease of native coronary artery with unstable angina pectoris: Secondary | ICD-10-CM | POA: Diagnosis present

## 2016-06-14 DIAGNOSIS — I469 Cardiac arrest, cause unspecified: Secondary | ICD-10-CM | POA: Diagnosis present

## 2016-06-14 DIAGNOSIS — Z862 Personal history of diseases of the blood and blood-forming organs and certain disorders involving the immune mechanism: Secondary | ICD-10-CM | POA: Diagnosis not present

## 2016-06-14 DIAGNOSIS — R402212 Coma scale, best verbal response, none, at arrival to emergency department: Secondary | ICD-10-CM | POA: Diagnosis present

## 2016-06-14 DIAGNOSIS — Z8249 Family history of ischemic heart disease and other diseases of the circulatory system: Secondary | ICD-10-CM

## 2016-06-14 DIAGNOSIS — D631 Anemia in chronic kidney disease: Secondary | ICD-10-CM | POA: Diagnosis present

## 2016-06-14 DIAGNOSIS — G253 Myoclonus: Secondary | ICD-10-CM | POA: Diagnosis present

## 2016-06-14 DIAGNOSIS — N186 End stage renal disease: Secondary | ICD-10-CM | POA: Diagnosis present

## 2016-06-14 DIAGNOSIS — E876 Hypokalemia: Secondary | ICD-10-CM | POA: Diagnosis not present

## 2016-06-14 DIAGNOSIS — G4733 Obstructive sleep apnea (adult) (pediatric): Secondary | ICD-10-CM | POA: Diagnosis present

## 2016-06-14 DIAGNOSIS — I2781 Cor pulmonale (chronic): Secondary | ICD-10-CM | POA: Diagnosis present

## 2016-06-14 DIAGNOSIS — G9340 Encephalopathy, unspecified: Secondary | ICD-10-CM | POA: Diagnosis not present

## 2016-06-14 HISTORY — DX: Cerebral infarction, unspecified: I63.9

## 2016-06-14 HISTORY — DX: End stage renal disease: N18.6

## 2016-06-14 HISTORY — DX: Tobacco use: Z72.0

## 2016-06-14 HISTORY — DX: Dependence on renal dialysis: Z99.2

## 2016-06-14 LAB — POCT I-STAT 3, ART BLOOD GAS (G3+)
ACID-BASE EXCESS: 2 mmol/L (ref 0.0–2.0)
Acid-Base Excess: 6 mmol/L — ABNORMAL HIGH (ref 0.0–2.0)
BICARBONATE: 29.6 mmol/L — AB (ref 20.0–28.0)
BICARBONATE: 28.1 mmol/L — AB (ref 20.0–28.0)
O2 SAT: 99 %
O2 SAT: 99 %
PCO2 ART: 28 mmHg — AB (ref 32.0–48.0)
PH ART: 7.43 (ref 7.350–7.450)
PO2 ART: 116 mmHg — AB (ref 83.0–108.0)
PO2 ART: 133 mmHg — AB (ref 83.0–108.0)
Patient temperature: 31.1
Patient temperature: 34
TCO2: 31 mmol/L (ref 0–100)
TCO2: 29 mmol/L (ref 0–100)
pCO2 arterial: 41 mmHg (ref 32.0–48.0)
pH, Arterial: 7.611 (ref 7.350–7.450)

## 2016-06-14 LAB — CBC WITH DIFFERENTIAL/PLATELET
BASOS PCT: 0 %
Basophils Absolute: 0 10*3/uL (ref 0.0–0.1)
EOS PCT: 1 %
Eosinophils Absolute: 0.1 10*3/uL (ref 0.0–0.7)
HEMATOCRIT: 34.3 % — AB (ref 39.0–52.0)
HEMOGLOBIN: 11.7 g/dL — AB (ref 13.0–17.0)
LYMPHS PCT: 8 %
Lymphs Abs: 0.9 10*3/uL (ref 0.7–4.0)
MCH: 29.8 pg (ref 26.0–34.0)
MCHC: 34.1 g/dL (ref 30.0–36.0)
MCV: 87.5 fL (ref 78.0–100.0)
MONOS PCT: 5 %
Monocytes Absolute: 0.6 10*3/uL (ref 0.1–1.0)
NEUTROS ABS: 10.2 10*3/uL — AB (ref 1.7–7.7)
Neutrophils Relative %: 86 %
Platelets: 215 10*3/uL (ref 150–400)
RBC: 3.92 MIL/uL — ABNORMAL LOW (ref 4.22–5.81)
RDW: 16.5 % — ABNORMAL HIGH (ref 11.5–15.5)
WBC: 11.8 10*3/uL — ABNORMAL HIGH (ref 4.0–10.5)

## 2016-06-14 LAB — ECHOCARDIOGRAM COMPLETE: Height: 64 in

## 2016-06-14 LAB — COMPREHENSIVE METABOLIC PANEL
ALBUMIN: 3.1 g/dL — AB (ref 3.5–5.0)
ALT: 39 U/L (ref 17–63)
AST: 94 U/L — AB (ref 15–41)
Alkaline Phosphatase: 209 U/L — ABNORMAL HIGH (ref 38–126)
Anion gap: 15 (ref 5–15)
BILIRUBIN TOTAL: 1.2 mg/dL (ref 0.3–1.2)
BUN: 56 mg/dL — AB (ref 6–20)
CHLORIDE: 97 mmol/L — AB (ref 101–111)
CO2: 25 mmol/L (ref 22–32)
Calcium: 8.4 mg/dL — ABNORMAL LOW (ref 8.9–10.3)
Creatinine, Ser: 7.22 mg/dL — ABNORMAL HIGH (ref 0.61–1.24)
GFR calc Af Amer: 8 mL/min — ABNORMAL LOW (ref >60)
GFR calc non Af Amer: 7 mL/min — ABNORMAL LOW (ref >60)
GLUCOSE: 136 mg/dL — AB (ref 65–99)
POTASSIUM: 5.3 mmol/L — AB (ref 3.5–5.1)
Sodium: 137 mmol/L (ref 135–145)
Total Protein: 7.3 g/dL (ref 6.5–8.1)

## 2016-06-14 LAB — I-STAT CHEM 8, ED
BUN: 69 mg/dL — ABNORMAL HIGH (ref 6–20)
CHLORIDE: 99 mmol/L — AB (ref 101–111)
Calcium, Ion: 0.92 mmol/L — ABNORMAL LOW (ref 1.15–1.40)
Creatinine, Ser: 6.8 mg/dL — ABNORMAL HIGH (ref 0.61–1.24)
GLUCOSE: 137 mg/dL — AB (ref 65–99)
HCT: 31 % — ABNORMAL LOW (ref 39.0–52.0)
Hemoglobin: 10.5 g/dL — ABNORMAL LOW (ref 13.0–17.0)
POTASSIUM: 4.5 mmol/L (ref 3.5–5.1)
Sodium: 138 mmol/L (ref 135–145)
TCO2: 26 mmol/L (ref 0–100)

## 2016-06-14 LAB — BASIC METABOLIC PANEL
Anion gap: 14 (ref 5–15)
BUN: 60 mg/dL — AB (ref 6–20)
CHLORIDE: 95 mmol/L — AB (ref 101–111)
CO2: 27 mmol/L (ref 22–32)
CREATININE: 7.37 mg/dL — AB (ref 0.61–1.24)
Calcium: 8 mg/dL — ABNORMAL LOW (ref 8.9–10.3)
GFR calc Af Amer: 8 mL/min — ABNORMAL LOW (ref >60)
GFR calc non Af Amer: 7 mL/min — ABNORMAL LOW (ref >60)
Glucose, Bld: 117 mg/dL — ABNORMAL HIGH (ref 65–99)
Potassium: 5.5 mmol/L — ABNORMAL HIGH (ref 3.5–5.1)
SODIUM: 136 mmol/L (ref 135–145)

## 2016-06-14 LAB — ETHANOL: Alcohol, Ethyl (B): <5 mg/dL (ref <5)

## 2016-06-14 LAB — TYPE AND SCREEN
ABO/RH(D): A POS
Antibody Screen: NEGATIVE

## 2016-06-14 LAB — TROPONIN I
TROPONIN I: 0.19 ng/mL — AB (ref <0.03)
Troponin I: 0.08 ng/mL (ref <0.03)

## 2016-06-14 LAB — GLUCOSE, CAPILLARY
Glucose-Capillary: 139 mg/dL — ABNORMAL HIGH (ref 65–99)
Glucose-Capillary: 102 mg/dL — ABNORMAL HIGH (ref 65–99)

## 2016-06-14 LAB — MRSA PCR SCREENING: MRSA BY PCR: NEGATIVE

## 2016-06-14 LAB — PROTIME-INR
INR: 1.35
Prothrombin Time: 16.8 seconds — ABNORMAL HIGH (ref 11.4–15.2)

## 2016-06-14 LAB — I-STAT TROPONIN, ED: Troponin i, poc: 0.02 ng/mL (ref 0.00–0.08)

## 2016-06-14 LAB — ABO/RH: ABO/RH(D): A POS

## 2016-06-14 MED ORDER — IOPAMIDOL (ISOVUE-370) INJECTION 76%
INTRAVENOUS | Status: AC
Start: 2016-06-14 — End: 2016-06-14
  Administered 2016-06-14: 80 mL
  Filled 2016-06-14: qty 100

## 2016-06-14 MED ORDER — ASPIRIN EC 81 MG PO TBEC
81.0000 mg | DELAYED_RELEASE_TABLET | Freq: Every day | ORAL | Status: DC
  Administered 2016-06-14 – 2016-06-15 (×2): 81 mg via ORAL
  Filled 2016-06-14 (×2): qty 1

## 2016-06-14 MED ORDER — FENTANYL CITRATE (PF) 100 MCG/2ML IJ SOLN
100.0000 ug | INTRAMUSCULAR | Status: DC | PRN
  Administered 2016-06-14 – 2016-06-15 (×3): 100 ug via INTRAVENOUS
  Filled 2016-06-14 (×3): qty 2

## 2016-06-14 MED ORDER — PROPOFOL 1000 MG/100ML IV EMUL
0.0000 ug/kg/min | INTRAVENOUS | Status: DC
  Administered 2016-06-14 (×2): 5 ug/kg/min via INTRAVENOUS

## 2016-06-14 MED ORDER — PANTOPRAZOLE SODIUM 40 MG IV SOLR
40.0000 mg | INTRAVENOUS | Status: DC
  Administered 2016-06-14 – 2016-06-18 (×5): 40 mg via INTRAVENOUS
  Filled 2016-06-14 (×5): qty 40

## 2016-06-14 MED ORDER — MIDAZOLAM HCL 2 MG/2ML IJ SOLN
2.0000 mg | INTRAMUSCULAR | Status: DC | PRN
  Administered 2016-06-15 (×2): 2 mg via INTRAVENOUS
  Filled 2016-06-14 (×3): qty 2

## 2016-06-14 MED ORDER — SODIUM CHLORIDE 0.9 % IV SOLN
INTRAVENOUS | Status: DC
  Administered 2016-06-14 – 2016-06-16 (×2): via INTRAVENOUS

## 2016-06-14 MED ORDER — DOCUSATE SODIUM 50 MG/5ML PO LIQD: 100.0000 mg | Freq: Two times a day (BID) | ORAL | Status: DC | PRN

## 2016-06-14 MED ORDER — ASPIRIN 300 MG RE SUPP
300.0000 mg | RECTAL | Status: DC
  Filled 2016-06-14: qty 1

## 2016-06-14 MED ORDER — SODIUM POLYSTYRENE SULFONATE 15 GM/60ML PO SUSP
30.0000 g | Freq: Once | ORAL | Status: AC
  Administered 2016-06-14: 30 g
  Filled 2016-06-14: qty 120

## 2016-06-14 MED ORDER — HYDRALAZINE HCL 20 MG/ML IJ SOLN
10.0000 mg | INTRAMUSCULAR | Status: DC | PRN
  Administered 2016-06-14 – 2016-06-16 (×2): 10 mg via INTRAVENOUS
  Filled 2016-06-14 (×2): qty 1

## 2016-06-14 MED ORDER — HEPARIN SODIUM (PORCINE) 5000 UNIT/ML IJ SOLN
5000.0000 [IU] | Freq: Three times a day (TID) | INTRAMUSCULAR | Status: DC
  Administered 2016-06-14 – 2016-06-17 (×9): 5000 [IU] via SUBCUTANEOUS
  Filled 2016-06-14 (×10): qty 1

## 2016-06-14 MED ORDER — ORAL CARE MOUTH RINSE: 15.0000 mL | OROMUCOSAL | Status: DC

## 2016-06-14 MED ORDER — CHLORHEXIDINE GLUCONATE 0.12% ORAL RINSE (MEDLINE KIT)
15.0000 mL | Freq: Two times a day (BID) | OROMUCOSAL | Status: DC
  Administered 2016-06-14 – 2016-06-15 (×3): 15 mL via OROMUCOSAL

## 2016-06-14 MED ORDER — BISACODYL 10 MG RE SUPP: 10.0000 mg | Freq: Every day | RECTAL | Status: DC | PRN

## 2016-06-14 MED ORDER — ROCURONIUM BROMIDE 50 MG/5ML IV SOLN
INTRAVENOUS | Status: DC | PRN
  Administered 2016-06-14: 70 mg via INTRAVENOUS

## 2016-06-14 MED ORDER — PROPOFOL 1000 MG/100ML IV EMUL
INTRAVENOUS | Status: AC
  Filled 2016-06-14: qty 100

## 2016-06-14 MED ORDER — ETOMIDATE 2 MG/ML IV SOLN
INTRAVENOUS | Status: DC | PRN
  Administered 2016-06-14: 20 mg via INTRAVENOUS

## 2016-06-14 MED ORDER — SODIUM CHLORIDE 0.9 % IV SOLN
1.0000 g | Freq: Once | INTRAVENOUS | Status: DC
  Filled 2016-06-14: qty 10

## 2016-06-14 MED ORDER — ATROPINE SULFATE 1 MG/10ML IJ SOSY
PREFILLED_SYRINGE | INTRAMUSCULAR | Status: AC
  Administered 2016-06-14: 1 mg
  Filled 2016-06-14: qty 10

## 2016-06-14 MED ORDER — CHLORHEXIDINE GLUCONATE 0.12% ORAL RINSE (MEDLINE KIT): 15.0000 mL | Freq: Two times a day (BID) | OROMUCOSAL | Status: DC

## 2016-06-14 MED ORDER — ORAL CARE MOUTH RINSE
15.0000 mL | Freq: Four times a day (QID) | OROMUCOSAL | Status: DC
  Administered 2016-06-14 – 2016-06-15 (×3): 15 mL via OROMUCOSAL

## 2016-06-14 MED ORDER — FENTANYL CITRATE (PF) 100 MCG/2ML IJ SOLN: 100.0000 ug | INTRAMUSCULAR | Status: DC | PRN

## 2016-06-14 MED ORDER — LEVETIRACETAM 500 MG/5ML IV SOLN
250.0000 mg | INTRAVENOUS | Status: DC
  Administered 2016-06-14 – 2016-06-15 (×2): 250 mg via INTRAVENOUS
  Filled 2016-06-14 (×2): qty 2.5

## 2016-06-14 MED ORDER — FENTANYL CITRATE (PF) 100 MCG/2ML IJ SOLN
100.0000 ug | INTRAMUSCULAR | Status: DC | PRN
  Administered 2016-06-14: 100 ug via INTRAVENOUS
  Filled 2016-06-14: qty 2

## 2016-06-14 NOTE — Progress Notes (Signed)
Jonathan KussmaulKatalina Terry and Neurology in to see patient.  Patient having seizure like activity again.  ? Myoclonic activity per neurology.  While being assessed, patient went bradycardic into 30's and then into 20's.  No pulse assessed and Code Blue activated.  Atropine given 1 amp and 1 amp of Sodium bicarb as well as an amp of epinephrine.  Levophed drip initially mixed and given as per orders.  Turned off after about 5 minutes running.  Please see code sheet and Jonathan Eubank's/Timothy Oster's notes.

## 2016-06-14 NOTE — Code Documentation (Signed)
This RN called back CT to ask when patient can be taken to CT and was told "5-10 minutes."

## 2016-06-14 NOTE — Progress Notes (Signed)
Chaplain was paged by nurse to support family for a coded Pt. Chaplain arrived and was directed to Pts significant other (Jonathan Terry). She was there with her sister, daughter and niece. Jonathan Terry was very nice and calm. She explained how the Pt arrived and said she was willing to allow God's will to be done. She explained that Pt was Catholic but didn't practice. Chaplain asked if they wantedCustomer service manager a priest . [name] explained she was a Control and instrumentation engineerBaptist. Chaplian asked if she would like a prayer and she consented. Chaplain prayed with Jonathan Terry and then prayed with Pt and others in the room. Chaplain learned that the daughter was having a difficult time because her dad  Death happened 5 year ago Sat. Chaplain focused on her and attempted to engage but she was rel;uctant.  Chaplain stayed until there seem to be acceptance and hope that he may survive.    06/14/16 1900  Clinical Encounter Type  Visited With Patient and family together  Visit Type Spiritual support  Referral From Nurse  Spiritual Encounters  Spiritual Needs Prayer;Emotional  Stress Factors  Patient Stress Factors Health changes  Family Stress Factors Health changes

## 2016-06-14 NOTE — ED Notes (Signed)
Pt back from CT

## 2016-06-14 NOTE — Progress Notes (Signed)
  Echocardiogram 2D Echocardiogram has been performed.  Arvil ChacoFoster, Nael Petrosyan 06/14/2016, 3:07 PM

## 2016-06-14 NOTE — Progress Notes (Signed)
ETCO2 hooked up during code.  Zoll read 0.  Unit R[facility] and [facility], Director aware.

## 2016-06-14 NOTE — Consult Note (Signed)
Neurology Consult Note  Reason for Consultation: Facial twitching following cardiac arrest  Requesting provider: Hayden Pedro, NP  CC: Unable to obtain as he is unresponsive and intubated  HPI: History is obtained from the review of the patient's medical record as well as discussion with primary NP. He is unable to provide any information due to his clinical condition.   According to notes, he was taking the bus to his usual dialysis appointment this morning when he became dizzy. EMS was activated and on their arrival they noted that he was bradycardic with heartrate of 40. He was given 1.5 mg of atropine after which he lost his pulses. ACLS was initiated with CPR and epinephrine. A King airway was placed in the field. It is unclear how long he may have been down--the ED triage RN reported that ROSC was obtained after eight minutes but the ED MD note states that his downtime was estimated to be 20-25 minutes. On arrival in the ED he was tachycardic and hypertensive. On exam he was unresponsive with GCS 3. Pupils were 3-4 mm with fasciculations in BLE. His King airway was removed and he was intubated in the ED with etomidate and rocuronium. CODE COOL was initially called and ice bags were placed but he was found to be hypothermic with temp of 30.9C and the ice packs were removed. He was admitted to the ICU for further management. In the ICU he was noted to have some facial twitching and neurology was consulted for further recommendations.   On my arrival, the patient is intubated and unresponsive. He is presently not receiving any sedation. He was noted to have myoclonic activity of the head/neck>arms. EEG was obtained and showed no evidence of seizure or epileptiform activity. CTH in the ED did not reveal any obvious acute abnormality. Shortly after my assessment, his heartrate dropped into the 20s and he became hypotensive with BP 50s/30s. He lost his pulses and experienced PEA arrest. CODE BLUE was  activated and he received CPR. He was paced at a rate of 80. He was given atropine, epinephrine, and bicarb and was started on a norepinephrine drip with ROSC after three minutes, after which he was able to maintain his HR and BP without vasopressor support.   Of note, the patient's girlfriend reported to the primary team that he was recently treated for Lyme disease with doxycyline. Since completing his antibiotics, he has not seemed himself, complaining of not feeling well and seeming depressed. He was given quetiapine which he has not been taking every day.   PMH:  Past Medical History:  Diagnosis Date  . Anemia   . CVA (cerebral vascular accident) ([place])   . ESRD (end stage renal disease) on dialysis ([address])   . GERD (gastroesophageal reflux disease)   . Hypertension   . Tobacco use     PSH:  History reviewed. No pertinent surgical history.  Family history: Family History  Problem Relation Age of Onset  . Hypertension Father     Social history:  Social History   Social History  . Marital status: N/A    Spouse name: N/A  . Number of children: N/A  . Years of education: N/A   Occupational History  . Not on file.   Social History Main Topics  . Smoking status: Never Smoker  . Smokeless tobacco: Never Used  . Alcohol use Not on file  . Drug use: Unknown  . Sexual activity: Not on file   Other Topics Concern  .  Not on file   Social History Narrative  . No narrative on file    Current outpatient meds: Medications reviewed and reconciled.  Current Meds  Medication Sig  . aspirin 325 MG tablet Take 325 mg by mouth daily.  Marland Kitchen FIBER PO Take 1 capsule by mouth 3 (three) times daily.  Marland Kitchen FLUoxetine (PROZAC) 20 MG capsule Take 20 mg by mouth 2 (two) times daily.  Marland Kitchen ipratropium (ATROVENT HFA) 17 MCG/ACT inhaler Inhale 1 puff into the lungs 2 (two) times daily as needed for wheezing.  Marland Kitchen ipratropium-albuterol (DUONEB) 0.5-2.5 (3) MG/3ML SOLN Take 3 mLs by nebulization every 6  (six) hours as needed (for shortness of breath).  . Multiple Vitamins-Minerals (CENTRUM SILVER PO) Take 1 tablet by mouth daily.  Marland Kitchen omeprazole (PRILOSEC) 20 MG capsule Take 20 mg by mouth daily.  . Oxycodone HCl 10 MG TABS Take 10 mg by mouth every 6 (six) hours as needed (for pain).  . QUEtiapine (SEROQUEL XR) 50 MG TB24 24 hr tablet Take 50 mg by mouth at bedtime.  . rosuvastatin (CRESTOR) 10 MG tablet Take 10 mg by mouth daily.  . traZODone (DESYREL) 50 MG tablet Take 50 mg by mouth at bedtime.    Current inpatient meds: Medications reviewed and reconciled.  Current Facility-Administered Medications  Medication Dose Route Frequency Provider Last Rate Last Dose  . 0.9 %  sodium chloride infusion   Intravenous Continuous Chesley Mires, MD 50 mL/hr at 06/14/16 1524    . atropine 1 MG/10ML injection           . bisacodyl (DULCOLAX) suppository 10 mg  10 mg Rectal Daily PRN Chesley Mires, MD      . chlorhexidine gluconate (MEDLINE KIT) (PERIDEX) 0.12 % solution 15 mL  15 mL Mouth Rinse BID Chesley Mires, MD   15 mL at 06/14/16 1400  . docusate (COLACE) 50 MG/5ML liquid 100 mg  100 mg Per Tube BID PRN Chesley Mires, MD      . fentaNYL (SUBLIMAZE) injection 100 mcg  100 mcg Intravenous Q2H PRN Chesley Mires, MD      . heparin injection 5,000 Units  5,000 Units Subcutaneous Q8H Sood, Vineet, MD      . hydrALAZINE (APRESOLINE) injection 10 mg  10 mg Intravenous Q4H PRN Chesley Mires, MD   10 mg at 06/14/16 1615  . levETIRAcetam (KEPPRA) 250 mg in sodium chloride 0.9 % 100 mL IVPB  250 mg Intravenous Q24H Hayden Pedro M, NP      . MEDLINE mouth rinse  15 mL Mouth Rinse QID Chesley Mires, MD      . midazolam (VERSED) injection 2 mg  2 mg Intravenous Q2H PRN Chesley Mires, MD      . pantoprazole (PROTONIX) injection 40 mg  40 mg Intravenous Q24H Chesley Mires, MD        Allergies: No Known Allergies  ROS: As per HPI. A full 14-point review of systems could not be obtained as he is intubated and  unresponsive.   PE:  BP (!) 158/90   Pulse 71   Temp (!) 89.4 F (31.9 C)   Resp (!) 27   Ht _0  (1.626 m)   Wt 63.9 kg (140 lb 14 oz)   SpO2 100%   BMI 24.18 kg/m   General: WD male lying in ICU bed. He is intubated, currently not receiving any sedation. He has no response to verbal, tactile, or noxious stimulation. He does not follow any commands.  HEENT: Normocephalic. Neck  supple without LAD. ETT in place. Sclerae anicteric. Mild conjunctival injection.  CV: Loletha Grayer, regular, no obvious murmur. Carotid pulses full and symmetric, no bruits. Distal pulses 1+ and symmetric.  Lungs: CTAB on anterior exam.Ventilated.  Abdomen: Soft, non-distended. No rebound or guarding. Bowel sounds absent.  Extremities: LUE fistula in place.  Neuro:  CN: Pupils are mildly irregular. The R is slightly larger than the left. Both are minimally reactive with rapid rebound. He does not blink to visual threat. His eyes are conjugate with some horizontal roving. Oculocephalics are absent. He has weak corneals bilaterally. His face appears grossly symmetric. He has intact gag with deep stimulation. The remainder of his cranial nerves cannot be assessed as he is not able to participate with the exam.  Motor: Normal bulk. He is flaccid throughout. He does not participate with confrontational testing. He was noted to have myoclonic activity involving the eyes, face, and head with lesser involvement of the extremities. He also had some flexion of the RUE and no-no movement of the head. No rhythmic activity was seen.  Sensation: He has no response to nailbed pressure x4 or to central pressure.  DTRs: 2+, symmetric. Toes mute bilaterally. Coordination/gait: These could not be assessed as he is not able to participate with the exam.   Labs:  Lab Results  Component Value Date   WBC 11.8 (H) 06/14/2016   HGB 11.7 (L) 06/14/2016   HCT 34.3 (L) 06/14/2016   PLT 215 06/14/2016   GLUCOSE 136 (H) 06/14/2016   ALT 39  06/14/2016   AST 94 (H) 06/14/2016   NA 137 06/14/2016   K 5.3 (H) 06/14/2016   CL 97 (L) 06/14/2016   CREATININE 7.22 (H) 06/14/2016   BUN 56 (H) 06/14/2016   CO2 25 06/14/2016   INR 1.35 06/14/2016   Troponin 0.02 EtOH <5 ABG 1751: pH 7.430, pCO2 41.0, pO2 133.0, bicarb 28.1  Imaging: I have personally and independently reviewed the [facility] without contrast from today. This shows no obvious acute abnormality. There is moderate diffuse generalized atrophy and moderate chronic small vessel disease.   Other diagnostic studies:  EEG reviewed and shows moderate to severe diffuse generalized slowing without any focal asymmetry, epileptiform discharges, or seizures.   Assessment and Plan:  1. 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 Initial rhythm was noted to be PEA. He was hypothermic on arrival in the ED so therapeutic hypothermia was not indicated. Management at this time is supportive as noted below.   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 The current examination shows weakly reactive pupils, intact corneals, intact gag, and intermittent myoclonus. Continue with supportive care. Avoid hypoxemia and hypotension for even brief intervals as these are associated with worse neurologic outcomes. Fever and hyperglycemia must be aggressively treated for the same reason. We will continue to follow the exam to aid with neurologic prognosis.   3. Myoclonus: He was noted to have some myoclonic activity on my assessment without overt seizure. EEG did not show any epileptiform activity or seizures. Would start Keppra 250 mg daily, will need to have additional dosing post-dialysis.   This was discussed with the patient's girlfriend at the bedside. Education was provided on the diagnosis and expected evaluation and treatment. She is in agreement with the plan as noted. She was given the opportunity to ask any  questions and these were addressed to her satisfaction.   The case was  discussed with Dr. Lake Bells and with NP Dewaine Oats.   Thank you for the opportunity to participate with this patient's care. I will continue to follow with you. Please call with any urgent questions or concerns.   This patient is critically ill and at significant risk of neurological worsening, death and care requires constant monitoring of vital signs, hemodynamics,respiratory and cardiac monitoring, neurological assessment, discussion with family, other specialists and medical decision making of high complexity. A total of 62 minutes of critical care time was spent on this case.

## 2016-06-14 NOTE — Code Documentation (Signed)
Dr.Sood at bedside  

## 2016-06-14 NOTE — Consult Note (Signed)
[facility] Renal Consultation Note  Indication for Consultation:  Management of ESRD/hemodialysis; anemia, hypertension/volume and secondary hyperparathyroidism  HPI: Jonathan Terry is a 61 y.o. male. With ESRD (chronic HD MWF  ASH unit  compliant with HD) ESRD presumed 2/2 HTN on HD since 2013 was on his way to OP  HD today on transportation bus ="became dizzy, EMS  Called to Bus and placed in  EMS truck and then cardiac arrested. Reportedly was bradycardic just prior to arrest and given atropine. Also given Epi, with CPR and ROSC after about 15-20 minutes. Intubated with king airway in the field. " [facility] to ETT after given etomidate and paralytic medication for this procedure.     [facility] and  recently reported "had a exercise myoview that showed no ischemia and EF of 64% with no WMA. Last echo 9/17 showed normal EF with LVH. Had a right heart cath on 2/18with mean pcw 19, mean pa 36."  PMED Hx= as above in HPI    Also= CVA in past  Pulmonary HTN, GERD, HTN, Depression, Duodenal mass 2014, Secondary hyperparathyroidism, Anemia of chronic disease, ETOH (quit 2012)  Tobacco Abuse in Past   [facility] 12/2012 [facility]   Past Surgical History = Left upper arm AVF  Social History = Per other records =separated, worked as Designer, fashion/clothingroofer, former smoker, quit drinking 2012  Prior to Admission medications   Not on File     Results for orders placed or performed during the hospital encounter of 06/14/16 (from the past 48 hour(s))  I-Stat Troponin, ED ([facility])     Status: None   Collection Time: 06/14/16 11:30 AM  Result Value Ref Range   Troponin i, poc 0.02 0.00 - 0.08 ng/mL   Comment 3            Comment: Due to the release kinetics of cTnI, a negative result within the first hours of the onset of symptoms does not rule out myocardial infarction with certainty. If myocardial infarction  is still suspected, repeat the test at appropriate intervals.   I-Stat Chem 8, ED     Status: Abnormal   Collection Time: 06/14/16 11:32 AM  Result Value Ref Range   Sodium 138 135 - 145 mmol/L   Potassium 4.5 3.5 - 5.1 mmol/L   Chloride 99 (L) 101 - 111 mmol/L   BUN 69 (H) 6 - 20 mg/dL   Creatinine, Ser 1.616.80 (H) 0.61 - 1.24 mg/dL   Glucose, Bld 096137 (H) 65 - 99 mg/dL   Calcium, Ion 0.450.92 (L) 1.15 - 1.40 mmol/L   TCO2 26 0 - 100 mmol/L   Hemoglobin 10.5 (L) 13.0 - 17.0 g/dL   HCT 40.931.0 (L) 81.139.0 - 91.452.0 %  Type and screen [facility]     Status: None (Preliminary result)   Collection Time: 06/14/16 12:05 PM  Result Value Ref Range   ABO/RH(D) A POS    Antibody Screen PENDING    Sample Expiration 06/17/2016     ROS: Not able to obtain Intubated    Physical Exam: Vitals:   06/14/16 1131 06/14/16 1215  BP: (!) 170/83 (!) 150/82  Pulse: 67 62  Resp: 19 16     General: Seen in ER , Intubated and sedated  Adult  male  HEENT: [place]  ETT in place  Heart: RRR , no rub, mur or gal Lungs: Intubated ,  CTA   Abdomen: BS pos,soft Non distended , nontender Extremities:no pedal edema Skin: no overt rash noted  Neuro: sedated  With Intubation , no response to voice  Dialysis Access: LUA AVF pos bruit   Dialysis Orders: Center: [facility]   on MWF . EDW 60.0 kg HD Bath 2k, 2.0Ca   Time 4hr Heparin None . Access LUA AVF     Calcitriol 0.75 mcg IV/HD , Mircera q 4wks  (last given 05/28/16)   Other  OP labs = hgb 11.1 06/09/16  pth 314   Ca 9.3  Phos  2.9   Assessment/Plan 1.  Acute Resp. Failure in setting of PEA Cardiac Arrest- CCM rx  2. ESRD -  K ok , volume ok , CXR  No overt CHF currently /on Vent and stable / hold off HD today  , fu labs   3. PEA cardiac Arrest- cards seeing / HO Pulm HTN /Htn  4. Acute Anoxic Encephalopathy - CT no acute bleed or cva  5. Anemia  Of ESRD - - hgb 10.4  ESA due 6/13(q 4wks as OP ,mat need sooner ) follow up HGB trend  6. Metabolic bone  disease -  Po VIt d on HD ( no needs today last pth 314) binders when pos   Lenny Pastel, PA-C [facility] [phone] 06/14/2016, 1:02 PM

## 2016-06-14 NOTE — Progress Notes (Signed)
eLink Physician-Brief Progress Note Patient Name: Jonathan Terry DOB: [birth year] MRN: [id number]   Date of Service  06/14/2016  HPI/Events of Note  Nurse reports critical labs prior to code: K+ = 5.3 and Troponin = 0.08. QRS not widened or T wave peaked. Doubt that K+ = 5.3 contributed to second code event. Elevated Troponin likely d/t first code event.   eICU Interventions  Will order: 1. BMP at 11 PM.      Intervention Category Major Interventions: Electrolyte abnormality - evaluation and management Intermediate Interventions: Diagnostic test evaluation  Sommer,Steven Eugene 06/14/2016, 7:30 PM

## 2016-06-14 NOTE — Consult Note (Addendum)
The patient has been seen in conjunction with Jonathan Stains, NP. All aspects of care have been considered and discussed. The patient has been personally interviewed, examined, and all clinical data has been reviewed.   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 First responders identified brady-asystolic mechanism. Underwent 25 minutes of CPR prior to ROSC.  Immediate post resuscitation EKGs revealed mild diffuse ST depression that resolved by hospital arrival with EKG demonstrating sinus bradycardia and nonspecific ST-T abnormality.  Exam is unremarkable with exception of decreased left radial pulse due to prior surgery for dialysis access. He is unresponsive. There is slight anisocoria. No abdominal tenderness. Cardiac exam reveals distant heart tones but otherwise unremarkable.  Urgent echocardiogram demonstrates dilated right heart, elevated right heart pressures, low normal to mildly depressed LV function with EF 50% without regional wall motion abnormality. Images were personally reviewed.  Status post cardiac arrest, with apparent Bradycardia-asystolic mechanism and subsequently no evidence of ischemia on EKG. He is comatose currently raising concern for the possibility of a CNS process.  Plan aggressive postresuscitation care. Consider neuro consult. Coronary angiography deferred. Prior relatively recent cardiac workup was benign. If his cardiac markers show significant elevation or ECG changes of acute ischemia develop, urgent cath may be performed.\  Will follow with you.  Critical Care time 40 minutes  Cardiology Consult    Patient ID: Jonathan Terry MRN: [id number], DOB/AGE: 11-21-1955   Admit date: 06/14/2016 Date of Consult: 06/14/2016  Primary Physician: No primary care provider on file. Primary Cardiologist:  New Requesting Provider: Tomi Terry Reason for Consultation: Cardiac Arrest  Jonathan Terry is a 61 y.o. male who is being seen today for the evaluation of a cardiac arrest at the request of Jonathan Terry.   Patient Profile    61 yo male with PMH of ESRD on HD, Pulmonary HTN, GERD, HTN, anemia, CVA and Tobacco/ETOH use who presented after a cardiac arrest. No prior history of cardiac disease, recent low risk myocardial perfusion study and prior documentation of normal EF (late 2017)  Past Medical History   Past Medical History:  Diagnosis Date  . Anemia   . CVA (cerebral vascular accident) ([place])   . ESRD (end stage renal disease) on dialysis ([place])   . GERD (gastroesophageal reflux disease)   . Hypertension   . Tobacco use     History reviewed. No pertinent surgical history.   Allergies  Not on File  History of Present Illness    Jonathan Terry is a 61 yo male with PMH of  ESRD on HD, Pulmonary HTN, GERD, HTN, anemia, CVA and Tobacco/ETOH use. Currently followed at [facility] and undergoing work up for possible renal transplant. He is a former smoker, and use drinking in 2012. His ERSD if felt to be 2/2 to HTN and has been on HD since 2013. He underwent an endarterectomy in 12/2012 with Jonathan Terry. Most recently had a exercise myoview that showed no ischemia and EF of 64% with no WMA. Last echo 9/17 showed normal EF with LVH. Had a right heart cath on 2/18with mean pcw 19, mean pa 36. Currently on HD MWF. Was reportedly in his usual state of health on the bus this morning on the way to HD. Started complaining of dizziness. The bus was stopped and EMS called. He was placed in the EMS truck and then cardiac arrested. Reportedly was bradycardic just prior to arrest  and given atropine. Also given Epi, with CPR and ROSC after about 15-20 minutes. Intubated with king airway in the field.   In the ED was given paralytic for ETT placement. Labs showed stable electrolytes. EKG with SB, diffuse ST depression noted.  CXR was negative for edema. PCCM was called for possible hypothermia protocol. Currently undergoing a CT head and CT chest.   Inpatient Medications    . chlorhexidine gluconate (MEDLINE KIT)  15 mL Mouth Rinse BID  . mouth rinse  15 mL Mouth Rinse QID  . pantoprazole (PROTONIX) IV  40 mg Intravenous Q24H    Family History    Family History  Problem Relation Age of Onset  . Hypertension Father     Social History    Social History   Social History  . Marital status: N/A    Spouse name: N/A  . Number of children: N/A  . Years of education: N/A   Occupational History  . Not on file.   Social History Main Topics  . Smoking status: Never Smoker  . Smokeless tobacco: Never Used  . Alcohol use Not on file  . Drug use: Unknown  . Sexual activity: Not on file   Other Topics Concern  . Not on file   Social History Narrative  . No narrative on file     Review of Systems    Obtained from chart, See HPI All other systems reviewed and are otherwise negative except as noted above.  Physical Exam    Blood pressure (!) 213/103, pulse 61, temperature (S) (!) 93.9 F (34.4 C), temperature source Rectal, resp. rate 16, height 5' 4"  (1.626 m), SpO2 100 %.  General: Intubated, received sedation Neuro: sedated and paralyzed. HEENT: pupils no nreactive Neck: no JVD. Lungs:  Resp regular and unlabored, CTA. Heart: Bradycardic, no s3, s4, or murmurs. Abdomen: Soft, non-tender, non-distended, BS + x 4.  Extremities: No clubbing, cyanosis or edema. Left arm fistula  Labs    Troponin (Point of Care Test)  Recent Labs  06/14/16 1130  TROPIPOC 0.02   No results for input(s): CKTOTAL, CKMB, TROPONINI in the last 72 hours. Lab Results  Component Value Date   HGB 10.5 (L) 06/14/2016   HCT 31.0 (L) 06/14/2016     Recent Labs Lab 06/14/16 1132  NA 138  K 4.5  CL 99*  BUN 69*  CREATININE 6.80*  GLUCOSE 137*   No results found for: CHOL, HDL, LDLCALC, TRIG No results  found for: [facility]   Radiology Studies    Ct Head Wo Contrast  Result Date: 06/14/2016 CLINICAL DATA:  61 year old male became dizzy on way to dialysis. Cardiac arrest. Initial encounter. EXAM: CT HEAD WITHOUT CONTRAST TECHNIQUE: Contiguous axial images were obtained from the base of the skull through the vertex without intravenous contrast. COMPARISON:  None. FINDINGS: Brain: No intracranial hemorrhage. Infarct posterior left lenticular nucleus probably remote although age indeterminate. No CT evidence of large acute infarct. Global atrophy without hydrocephalus. No intracranial mass lesion noted on this unenhanced exam. Preservation gray-white differentiation without findings of diffuse anoxia currently. Vascular: Vascular calcifications. Skull: No acute abnormality. Sinuses/Orbits: Post lens replacement. Mild exophthalmos. No acute abnormality. Mucosal thickening maxillary sinuses and ethmoid sinus air cells bilaterally. Other: Mastoid air cells and middle ear cavities are clear. Prominent dermal calcifications possibly vascular in origin. IMPRESSION: No intracranial hemorrhage. Infarct posterior left lenticular nucleus probably remote although age indeterminate. No CT evidence of large acute infarct. Global atrophy. Maxillary sinus and ethmoid sinus  air cell mucosal thickening. Electronically Signed   By: Genia Del M.D.   On: 06/14/2016 13:20   Dg Chest Portable 1 View  Result Date: 06/14/2016 CLINICAL DATA:  Endotracheal tube placement EXAM: PORTABLE CHEST 1 VIEW COMPARISON:  None. FINDINGS: Endotracheal tube with tip 2.4 cm above the carina. An orogastric tube reaches the stomach at least. Cardiomegaly. Retrocardiac opacity with volume loss. Asymmetric interstitial opacity on the right. Suspect layering pleural effusions. IMPRESSION: 1. Endotracheal tube tip 2.4 cm above the carina. The orogastric tube at least reaches the stomach. 2. Retrocardiac opacity with volume loss favoring atelectasis. 3.  Suspect layering pleural effusions. Interstitial coarsening on the right which could be atelectasis, asymmetric edema, or infection. Electronically Signed   By: Monte Fantasia M.D.   On: 06/14/2016 11:57    ECG & Cardiac Imaging    EKG: SB with diffuse ST depressions   Echo: 9/17  SUMMARY There is mild concentric left ventricular hypertrophy.  Left ventricular systolic function is normal. LV ejection fraction = 55-60%.  Left ventricular filling pattern is indeterminate. The left ventricular wall motion is normal. The right ventricle is mildly dilated. The right ventricular systolic function is normal. The left atrium is mildly dilated. The right atrium is mildly dilated. There is mild tricuspid regurgitation. Severe pulmonary hypertension. Estimated right ventricular systolic pressure is 69 mmHg. There is small size pericardial effusion. Compared to previous study in 2014, RV size has enlarged and pulmonary  hypertension is now present. - FINDINGS:  LEFT VENTRICLE The left ventricle is mildly dilated. There is mild concentric left  ventricular hypertrophy. Left ventricular systolic function is normal. LV  ejection fraction = 55-60%. Left ventricular filling pattern is  indeterminate. The left ventricular wall motion is normal.  Assessment & Plan    61 yo male with PMH of ESRD on HD, Pulmonary HTN, GERD, HTN, anemia, CVA and Tobacco/ETOH use who presented after a cardiac arrest.   1. Cardiac Arrest: Reportedly was dizzy on the bus when on the way to HD. Cardiac arrested in the EMS truck, CPR with ROSC in 15-20mins. EKG shows SB without acute ischemia noted. Electrolytes stable. Recently underwent RHC only at [facility] while undergoing work up for possible kidney transplant. CT head still pending -- would cycle enzymes -- check echocardiogram -- will need cardiac cath at some point to rule out CAD as cause for arrest. Currently without ischemic changes and initial normal troponin  we have decided to defer to a later time.  2. Acute respiratory failure: ETT placed in the ED. PCCM managing.  -- CTA pending  3. ERSD on HD: goes MWF -- nephrology consulted for management  4. Anemia: appears around baseline.   5. Hypothermia  6. Cerebrovascular disease with prior CVA and possible acute unidentified CNS injury.  Barnet Pall, NP-C Pager [phone] 06/14/2016, 1:27 PM

## 2016-06-14 NOTE — Progress Notes (Addendum)
eLink Physician-Brief Progress Note Patient Name: Jonathan Terry DOB: 10-11-1955 MRN: [id number]   Date of Service  06/14/2016  HPI/Events of Note  Multiple issues: 1. K+ = 5.5 and 2. Troponin = 0.08 --> 0.19. Likely related to multiple cardiac arrests today.   eICU Interventions  Will order: 1. Kayexalate 30 gm per tube now.  2. ASA 81 mg per tube now and Q day. 3. Continue to cycle Troponin. 4. 12 Lead EKG now.      Intervention Category Major Interventions: Electrolyte abnormality - evaluation and management Intermediate Interventions: Diagnostic test evaluation  Sommer,Steven Eugene 06/14/2016, 10:16 PM

## 2016-06-14 NOTE — ED Triage Notes (Addendum)
PER EMS: pt initially called EMS for dizziness when pt was on the way to Dialysis on the [facility] bus (goes MWF). EMS arrived, pts HR was 40, 1.5mg  atropine was given. Pt lost pulses, CPR started, epi given and performed about 8 minutes until ROSC. King airway inserted PTA by EMS en route. HR now 112, assisted ventilations. BP-205/115

## 2016-06-14 NOTE — Code Documentation (Signed)
  Patient Name: Jonathan Terry   MRN: [id number]   Date of Birth/ Sex: June 16, 1955 , male      Admission Date: 06/14/2016  Attending Provider: Coralyn HellingSood, Vineet, MD  Primary Diagnosis: <principal problem not specified>   Indication: Pt was in his usual state of health until this PM, when he was noted to be bradycardic then in asystole. Code blue was subsequently called. At the time of arrival on scene, ACLS protocol was underway and patient had achieved ROSC.   Technical Description:  - CPR performance duration:  6 minutes  - Was defibrillation or cardioversion used? No   - Was external pacer placed? Yes  - Was patient intubated pre/post CPR? Yes   Medications Administered: Y = Yes; Blank = No Amiodarone    Atropine  1  Calcium    Epinephrine  1  Lidocaine    Magnesium    Norepinephrine    Phenylephrine    Sodium bicarbonate  1  Vasopressin    Levophed gtt  Post CPR evaluation:  - Final Status - Was patient successfully resuscitated ? Yes - What is current rhythm? paced - What is current hemodynamic status? guarded  Miscellaneous Information:  - Labs sent, including: Per primary  - Primary team notified?  Yes - at bedside on code team arrival  - Family Notified? Yes - at bedside  - Additional notes/ transfer status:  n/a     Nyra MarketSvalina, Vartan Kerins, MD  06/14/2016, 5:20 PM

## 2016-06-14 NOTE — Procedures (Signed)
ELECTROENCEPHALOGRAM REPORT  Date of Study: 06/14/2016  Patient's Name: Jonathan Terry MRN: [id number] Date of Birth: 06/15/1955  Referring Provider: Jovita KussmaulKatalina Eubanks, NP  Clinical History: This is a [age] year old man s/p cardiac arrest  Medications: Prn medications Propofol turned off over an hour prior to study  Technical Summary: A multichannel digital EEG recording measured by the international 10-20 system with electrodes applied with paste and impedances below 5000 ohms performed as portable with EKG monitoring in an intubated and unresponsive patient, hypothermic at 31.1 degrees Celsius, not on hypothermia protocol.  Hyperventilation and photic stimulation were not performed.  The digital EEG was referentially recorded, reformatted, and digitally filtered in a variety of bipolar and referential montages for optimal display.   Description: The patient is intubated and unresponsive during the recording. Propofol turned off over an hour prior to the study. There is no clear posterior dominant rhythm. There is diffuse suppression of background activity. At a sensitivity of 3 uV/mm, there is a large amount of diffuse theta and delta slowing seen. There is slight increase in faster frequencies and muscle artifact with noxious stimulation. Hyperventilation and photic stimulation were not performed.  There were no epileptiform discharges or electrographic seizures seen.    EKG lead showed sinus bradycardia.  Impression: This EEG is abnormal due to diffuse suppression and slowing of the background.  Clinical Correlation of the above findings indicates diffuse cerebral dysfunction that is non-specific in etiology and can be seen with hypoxic/ischemic injury, toxic/metabolic encephalopathies, or medication effect. There were no electrographic seizures in this study.   The absence of epileptiform discharges does not rule out a clinical diagnosis of epilepsy.  Clinical correlation is  advised.   Patrcia DollyKaren Daley Mooradian, M.D.

## 2016-06-14 NOTE — Progress Notes (Signed)
CRITICAL VALUE ALERT  Critical Value:  Potassium 5.5.   Troponin 0.19  Date & Time Notied:  No notification received from lab  Provider Notified: called eLink at 2210, physician was not available.  Reported lab values to Delta Air LinesLiz RN who will report to MD.

## 2016-06-14 NOTE — Code Documentation (Signed)
Dr. Lynelle DoctorKnapp wants to call code cool. Katrinka BlazingMelissa S, charge RN informed.

## 2016-06-14 NOTE — ED Provider Notes (Signed)
[place] DEPT Provider Note   CSN: [id number] Arrival date & time: 06/14/16  1116     History   Chief Complaint Chief Complaint  Patient presents with  . Post CPR   Level V caveat: Cardiac arrest HPI Jonathan Terry is a 61 y.o. male.  HPI Patient presents to the emergency room for evaluation after cardiac arrest.  Patient has history of chronic renal failure. He was getting on the bus for dialysis. According to EMS personnel the patient complained of feeling some dizziness.  Patient became unresponsive. EMS was called. He was found to be in cardiac arrest. They administered CPR, gave him 1.5 mg of atropine. Patient regained pulses. His total downtime was estimated to be maybe 20-25 minutes or less. EMS placed a [facility] airway. En route his heart rate was in the low 100s and his blood pressure was 205/115.  Past Medical History:  Diagnosis Date  . Anemia   . CVA (cerebral vascular accident) ([place])   . ESRD (end stage renal disease) on dialysis ([place])   . GERD (gastroesophageal reflux disease)   . Hypertension   . Tobacco use     Patient Active Problem List   Diagnosis Date Noted  . Cardiac arrest ([place]) 06/14/2016    No past surgical history on file.     Home Medications    Prior to Admission medications   Not on File    Family History No family history on file.  Social History Social History  Substance Use Topics  . Smoking status: Not on file  . Smokeless tobacco: Not on file  . Alcohol use Not on file     Allergies   Patient has no allergy information on record.   Review of Systems Review of Systems  Unable to perform ROS: Acuity of condition     Physical Exam Updated Vital Signs BP (!) 150/82   Pulse 62   Resp 16   Ht 1.626 m (_0 )   SpO2 100%   Physical Exam  HENT:  Head: Normocephalic and atraumatic.  Right Ear: External ear normal.  Left Ear: External ear normal.  Pupils are approximately 3-4 mm bilaterally, appears to Prior  ocular surgery  Eyes: Conjunctivae are normal. Right eye exhibits no discharge. Left eye exhibits no discharge. No scleral icterus.  Neck: Neck supple. No tracheal deviation present.  Cardiovascular: Normal rate, regular rhythm and intact distal pulses.   Pulmonary/Chest: Effort normal and breath sounds normal. No stridor. No respiratory distress. He has no wheezes. He has no rales.  Abdominal: Soft. Bowel sounds are normal. He exhibits no distension. There is no tenderness. There is no rebound and no guarding.  Musculoskeletal: He exhibits no edema or tenderness.  AV fistula in his upper extremity  Neurological: He is unresponsive. He exhibits normal muscle tone. GCS eye subscore is 1. GCS verbal subscore is 1. GCS motor subscore is 1.  Fasciculations noted in the lower extremities intermittently bilaterally  Skin: Skin is warm and dry. No rash noted.  Psychiatric: He has a normal mood and affect.  Nursing note and vitals reviewed.    ED Treatments / Results  Labs (all labs ordered are listed, but only abnormal results are displayed) Labs Reviewed  I-STAT CHEM 8, ED - Abnormal; Notable for the following:       Result Value   Chloride 99 (*)    BUN 69 (*)    Creatinine, Ser 6.80 (*)    Glucose, Bld 137 (*)  Calcium, Ion 0.92 (*)    Hemoglobin 10.5 (*)    HCT 31.0 (*)    All other components within normal limits  COMPREHENSIVE METABOLIC PANEL  ETHANOL  CBC WITH DIFFERENTIAL/PLATELET  PROTIME-INR  TROPONIN I  TROPONIN I  TROPONIN I  I-STAT TROPOININ, ED  I-STAT ARTERIAL BLOOD GAS, ED  TYPE AND SCREEN  ABO/RH    EKG  EKG Interpretation  Date/Time:  Monday June 14 2016 11:48:58 EDT Ventricular Rate:  53 PR Interval:    QRS Duration: 128 QT Interval:  581 QTC Calculation: 546 R Axis:   98 Text Interpretation:  Sinus rhythm Consider left atrial enlargement Nonspecific intraventricular conduction delay Nonspecific repol abnormality, diffuse leads No significant change  since last tracing except rate slower Confirmed by Dorie Rank [phone]) on 06/14/2016 12:00:25 PM       Radiology Dg Chest Portable 1 View  Result Date: 06/14/2016 CLINICAL DATA:  Endotracheal tube placement EXAM: PORTABLE CHEST 1 VIEW COMPARISON:  None. FINDINGS: Endotracheal tube with tip 2.4 cm above the carina. An orogastric tube reaches the stomach at least. Cardiomegaly. Retrocardiac opacity with volume loss. Asymmetric interstitial opacity on the right. Suspect layering pleural effusions. IMPRESSION: 1. Endotracheal tube tip 2.4 cm above the carina. The orogastric tube at least reaches the stomach. 2. Retrocardiac opacity with volume loss favoring atelectasis. 3. Suspect layering pleural effusions. Interstitial coarsening on the right which could be atelectasis, asymmetric edema, or infection. Electronically Signed   By: Monte Fantasia M.D.   On: 06/14/2016 11:57    Procedures Procedure Name: Intubation Date/Time: 06/14/2016 12:16 PM Performed by: Dorie Rank Pre-anesthesia Checklist: Patient identified, Timeout performed, Emergency Drugs available, Suction available and Patient being monitored Preoxygenation: Pre-oxygenation with 100% oxygen Intubation Type: Rapid sequence Laryngoscope Size: Glidescope and 4 Grade View: Grade I Tube size: 7.5 mm Number of attempts: 1 Airway Equipment and Method: Video-laryngoscopy Placement Confirmation: ETT inserted through vocal cords under direct vision,  Positive ETCO2 and Breath sounds checked- equal and bilateral Comments: King airway replaced.  Dentures removed prior to intubation     .Critical Care Performed by: Dorie Rank Authorized by: Dorie Rank   Critical care provider statement:    Critical care time (minutes):  39   Critical care was time spent personally by me on the following activities:  Discussions with consultants, evaluation of patient's response to treatment, examination of patient, ordering and performing treatments and  interventions, ordering and review of laboratory studies, ordering and review of radiographic studies, pulse oximetry, re-evaluation of patient's condition, obtaining history from patient or surrogate and review of old charts   (including critical care time)  Medications Ordered in ED Medications  0.9 %  sodium chloride infusion (not administered)  propofol (DIPRIVAN) 1000 MG/100ML infusion (not administered)  fentaNYL (SUBLIMAZE) injection 100 mcg (not administered)  midazolam (VERSED) injection 2 mg (not administered)  docusate (COLACE) 50 MG/5ML liquid 100 mg (not administered)  bisacodyl (DULCOLAX) suppository 10 mg (not administered)  chlorhexidine gluconate (MEDLINE KIT) (PERIDEX) 0.12 % solution 15 mL (not administered)  MEDLINE mouth rinse (not administered)  pantoprazole (PROTONIX) injection 40 mg (not administered)  iopamidol (ISOVUE-370) 76 % injection (80 mLs  Contrast Given 06/14/16 1233)     Initial Impression / Assessment and Plan / ED Course  I have reviewed the triage vital signs and the nursing notes.  Pertinent labs & imaging results that were available during my care of the patient were reviewed by me and considered in my medical decision making (see chart  for details).  Clinical Course as of Jun 14 1309  Mon Jun 14, 2016  1206 Discussed with critical care  [JK]  1219 Spoke with cardiology, trish.  [JK]    Clinical Course User Index [JK] Dorie Rank, MD    Patient presented to the emergency room after witnessed cardiac arrest. Initially was concerned about the possibilities hyperkalemia associated with his chronic renal failure. Initial electrolyte panel however is reassuring. EKG does not show any widened QRS complex or peaked T waves.  The patient's [facility] airway was removed and I intubated him without difficulty. Plan on keeping the patient cooled. I will consult with cardiology and the critical care service for admission.  Etiology of his arrest is unclear at  this time.  Final Clinical Impressions(s) / ED Diagnoses   Final diagnoses:  Cardiac arrest [facility])      Dorie Rank, MD 06/14/16 1312

## 2016-06-14 NOTE — Progress Notes (Signed)
S:  Called to bedside to assess patient for questionable seizure activity. Upon arrival patient was noted to have facial twitching, nurse reports that patient has been HTN Systolic >190 and was given orders to give Hydralazine via [facility]. Neurology was called to assess. Deemed Myoclonic seizures. While in the room, patient went bradycardiac with HR 20-30 (atropine given) and then went PEA. Underwent one round of CPR with 1 amp bicarb and 1 epi push with return on ROSC.      O:  Blood pressure (!) 158/90, pulse 71, temperature (!) 89.4 F (31.9 C), resp. rate (!) 27, height 5\' 4"  (1.626 m), weight 63.9 kg (140 lb 14 oz), SpO2 100 %.   General: Adult male, no distress, on vent  Neuro: pupils non-responsive, right 7mm, left 6mm, +gag, no movement to painful stimuli   CV: Huston FoleyBrady, no MRG PULM: Clear breath sounds, no wheeze  GI: obese, active bowel sounds  Extremities: -edema, warm, dry, intact    CBC    Component Value Date/Time   WBC 11.8 (H) 06/14/2016 1543   RBC 3.92 (L) 06/14/2016 1543   HGB 11.7 (L) 06/14/2016 1543   HCT 34.3 (L) 06/14/2016 1543   PLT 215 06/14/2016 1543   MCV 87.5 06/14/2016 1543   MCH 29.8 06/14/2016 1543   MCHC 34.1 06/14/2016 1543   RDW 16.5 (H) 06/14/2016 1543   LYMPHSABS PENDING 06/14/2016 1543   MONOABS PENDING 06/14/2016 1543   EOSABS PENDING 06/14/2016 1543   BASOSABS PENDING 06/14/2016 1543    BMET    Component Value Date/Time   NA 138 06/14/2016 1132   K 4.5 06/14/2016 1132   CL 99 (L) 06/14/2016 1132   GLUCOSE 137 (H) 06/14/2016 1132   BUN 69 (H) 06/14/2016 1132   CREATININE 6.80 (H) 06/14/2016 1132      A:  PEA Cardiac Arrest  Severe PHTN vs Prolonged QTC secondary to Seroquel  P: -Repeat ABG now -Trend Troponin  -Keppra 250 mg q 24 hours -Trend BMP and CBC -Hold HTN medications  -EKG now   Spoke with family at bedside regarding poor overall prognosis, girlfriend states that children and brother are in [place] and she is trying to  get in touch with them,   CC Time 62 minutes  Jovita KussmaulKatalina Eubanks, AGACNP-BC [facility]  Pgr: [phone]  PCCM Pgr: [phone]

## 2016-06-14 NOTE — Progress Notes (Signed)
EEG completed, results pending. 

## 2016-06-14 NOTE — ED Notes (Signed)
Pt to ct at this time.

## 2016-06-14 NOTE — Code Documentation (Signed)
This RN called CT at 1155 to ask what scanner the patient can be taken to and CT was informed pt is a post CPR and this RN was told "We will call you back."

## 2016-06-14 NOTE — Progress Notes (Signed)
Potassium level and Troponin [facility]  Potassium 5.3 and Troponin 0.08.  No new orders.

## 2016-06-14 NOTE — Code Documentation (Signed)
Ice bags placed on patient.  

## 2016-06-14 NOTE — H&P (Signed)
PCCM Progress Note  Admission date: 06/14/2016 Referring provider: Dr. Lynelle Doctor, ER  CC: Short of breath.  HPI: Hx from chart and ER staff.  61 yo male was on bus going to HD.  C/o shortness of breath and dizziness.  Developed cardiac arrest.  Did not require defibrillation.  ROSC after about 25 minutes.  He had King airway changed to ETT in ER and given etomidate and paralytic medication for this procedure.  Past medical history - ESRD, CVA, Pulmonary HTN, GERD, HTN, Depression, Duodenal mass 2014, Secondary hyperparathyroidism, Anemia of chronic disease, ETOH (quit 2012)  Past surgical history - unable to obtain  Outpatient medications: Norvasc 10 mg daily, Aspirin 81 mg daily, Prozac 20 mg daily, lopressor 50 mg bid, MVI, Prilosec 20 mg daily, Oxycodone 10 mg q6h prn, trazodone 50 mg daily, velphoro 500 mg tid  No known drug allergies  Family history - Cancer in sister, Kidney disease in father  Social history - separated, worked as Designer, fashion/clothing, former smoker, quit drinking 2012  ROS: Unable to obtain.  Vital signs: BP (!) 150/82   Pulse 62   Resp 16   Ht 5\' 4"  (1.626 m)   SpO2 100%   Intake/output: No intake/output data recorded.  General: seen in ER Neuro: sedated, paralyzed HEENT: pupils non reactive, ETT in place Cardiac: regular, no murmur Chest: decreased BS, no wheeze Abd: soft, decreased BS Ext: no edema, AV graft with thrill Lt arm Skin: no rashes   CMP Latest Ref Rng & Units 06/14/2016  Glucose 65 - 99 mg/dL 086(V)  BUN 6 - 20 mg/dL 78(I)  Creatinine 6.96 - 1.24 mg/dL 2.95(M)  Sodium 841 - 324 mmol/L 138  Potassium 3.5 - 5.1 mmol/L 4.5  Chloride 101 - 111 mmol/L 99(L)     CBC Latest Ref Rng & Units 06/14/2016  Hemoglobin 13.0 - 17.0 g/dL 10.5(L)  Hematocrit 39.0 - 52.0 % 31.0(L)     ABG    Component Value Date/Time   TCO2 26 06/14/2016 1132     CBG (last 3)  No results for input(s): GLUCAP in the last 72 hours.   Imaging: Dg Chest Portable 1  View  Result Date: 06/14/2016 CLINICAL DATA:  Endotracheal tube placement EXAM: PORTABLE CHEST 1 VIEW COMPARISON:  None. FINDINGS: Endotracheal tube with tip 2.4 cm above the carina. An orogastric tube reaches the stomach at least. Cardiomegaly. Retrocardiac opacity with volume loss. Asymmetric interstitial opacity on the right. Suspect layering pleural effusions. IMPRESSION: 1. Endotracheal tube tip 2.4 cm above the carina. The orogastric tube at least reaches the stomach. 2. Retrocardiac opacity with volume loss favoring atelectasis. 3. Suspect layering pleural effusions. Interstitial coarsening on the right which could be atelectasis, asymmetric edema, or infection. Electronically Signed   By: Marnee Spring M.D.   On: 06/14/2016 11:57     Studies: CT head 6/04 >> CT angio chest 6/04 >>  Lines/tubes: ETT 6/04 >>  Events: 6/04 Admit, cardiology and renal consulted  Summary: 61 yo with PEA cardiac arrest with 25 minutes for ROSC.  Hx of ESRD.  CXR shows relatively oligemia on Lt side.  Assessment/plan:  Acute respiratory failure in setting of PEA cardiac arrest. - concern for acute PE - f/u CT angio chest - full vent support - f/u ABG  PEA cardiac arrest. Hx of HTN, pulmonary HTN, CVA. - f/u Echo, cardiac enzymes - cardiology consulted - defer targeted temperature management for now  ESRD on HD M,W, F. - nephrology consulted  Acute anoxic encephalopathy. -  avoid further paralytic and limit sedation to allow for better neurologic assessment - f/u CT head >> if negative for bleed, then consider starting heparin gtt pending CT chest  Anemia of chronic disease. - f/u CBC  DVT prophylaxis - SCDs SUP - protonix Nutrition - NPO Goals of care - full code  CC time 42 minutes  Coralyn HellingVineet Inge Waldroup, MD [facility] 06/14/2016, 12:48 PM Pager:  [phone] After 3pm call: [phone]

## 2016-06-14 NOTE — Progress Notes (Signed)
Patient with HTN noted and now having what appears to be seizure like activity.  Called [facility] physician to have them to camera into room to see if it was seizures, or just patient waking up from paralytic.  Physician unable to cameral into room at this time.  Told to give BP medicine and to give patient versed.  Jonathan KussmaulKatalina Eubanks, NP paged and on way up to see patient.  Hydralazine given at 1615.  Versed not given at this time until she could see patient.  Seizure like activity lasted around 2 minutes and then stopped.

## 2016-06-15 ENCOUNTER — Inpatient Hospital Stay (HOSPITAL_COMMUNITY): Payer: Medicare Other

## 2016-06-15 DIAGNOSIS — N186 End stage renal disease: Secondary | ICD-10-CM | POA: Diagnosis present

## 2016-06-15 DIAGNOSIS — R0602 Shortness of breath: Secondary | ICD-10-CM

## 2016-06-15 DIAGNOSIS — J9601 Acute respiratory failure with hypoxia: Secondary | ICD-10-CM

## 2016-06-15 DIAGNOSIS — I272 Pulmonary hypertension, unspecified: Secondary | ICD-10-CM | POA: Diagnosis present

## 2016-06-15 DIAGNOSIS — I10 Essential (primary) hypertension: Secondary | ICD-10-CM | POA: Diagnosis present

## 2016-06-15 LAB — GLUCOSE, CAPILLARY
Glucose-Capillary: 101 mg/dL — ABNORMAL HIGH (ref 65–99)
Glucose-Capillary: 104 mg/dL — ABNORMAL HIGH (ref 65–99)
Glucose-Capillary: 95 mg/dL (ref 65–99)

## 2016-06-15 LAB — RENAL FUNCTION PANEL
Albumin: 2.6 g/dL — ABNORMAL LOW (ref 3.5–5.0)
Anion gap: 13 (ref 5–15)
BUN: 68 mg/dL — ABNORMAL HIGH (ref 6–20)
CO2: 28 mmol/L (ref 22–32)
Calcium: 8.2 mg/dL — ABNORMAL LOW (ref 8.9–10.3)
Chloride: 98 mmol/L — ABNORMAL LOW (ref 101–111)
Creatinine, Ser: 7.89 mg/dL — ABNORMAL HIGH (ref 0.61–1.24)
GFR calc Af Amer: 7 mL/min — ABNORMAL LOW (ref >60)
GFR calc non Af Amer: 6 mL/min — ABNORMAL LOW (ref >60)
Glucose, Bld: 99 mg/dL (ref 65–99)
Phosphorus: 4 mg/dL (ref 2.5–4.6)
Potassium: 5.1 mmol/L (ref 3.5–5.1)
Sodium: 139 mmol/L (ref 135–145)

## 2016-06-15 LAB — HEPATIC FUNCTION PANEL
ALBUMIN: 2.7 g/dL — AB (ref 3.5–5.0)
ALK PHOS: 176 U/L — AB (ref 38–126)
ALT: 25 U/L (ref 17–63)
AST: 34 U/L (ref 15–41)
Bilirubin, Direct: 0.3 mg/dL (ref 0.1–0.5)
Indirect Bilirubin: 0.6 mg/dL (ref 0.3–0.9)
TOTAL PROTEIN: 6.5 g/dL (ref 6.5–8.1)
Total Bilirubin: 0.9 mg/dL (ref 0.3–1.2)

## 2016-06-15 LAB — CBC
HCT: 32.3 % — ABNORMAL LOW (ref 39.0–52.0)
Hemoglobin: 10.5 g/dL — ABNORMAL LOW (ref 13.0–17.0)
MCH: 27.4 pg (ref 26.0–34.0)
MCHC: 32.5 g/dL (ref 30.0–36.0)
MCV: 84.3 fL (ref 78.0–100.0)
PLATELETS: 218 10*3/uL (ref 150–400)
RBC: 3.83 MIL/uL — AB (ref 4.22–5.81)
RDW: 16.4 % — ABNORMAL HIGH (ref 11.5–15.5)
WBC: 11.7 10*3/uL — ABNORMAL HIGH (ref 4.0–10.5)

## 2016-06-15 LAB — MAGNESIUM: Magnesium: 2.6 mg/dL — ABNORMAL HIGH (ref 1.7–2.4)

## 2016-06-15 MED ORDER — ACETAMINOPHEN 325 MG PO TABS
650.0000 mg | ORAL_TABLET | Freq: Four times a day (QID) | ORAL | Status: DC | PRN
  Administered 2016-06-16 – 2016-06-17 (×2): 650 mg via ORAL
  Filled 2016-06-15 (×2): qty 2

## 2016-06-15 MED ORDER — LIDOCAINE HCL (PF) 1 % IJ SOLN: 5.0000 mL | INTRAMUSCULAR | Status: DC | PRN

## 2016-06-15 MED ORDER — FENTANYL 2500MCG IN NS 250ML (10MCG/ML) PREMIX INFUSION
25.0000 ug/h | INTRAVENOUS | Status: DC
  Administered 2016-06-15: 50 ug/h via INTRAVENOUS
  Filled 2016-06-15: qty 250

## 2016-06-15 MED ORDER — FENTANYL CITRATE (PF) 100 MCG/2ML IJ SOLN: 50.0000 ug | Freq: Once | INTRAMUSCULAR | Status: DC

## 2016-06-15 MED ORDER — SODIUM CHLORIDE 0.9 % IV SOLN: 100.0000 mL | INTRAVENOUS | Status: DC | PRN

## 2016-06-15 MED ORDER — ROSUVASTATIN CALCIUM 10 MG PO TABS
10.0000 mg | ORAL_TABLET | Freq: Every day | ORAL | Status: DC
Start: 2016-06-15 — End: 2016-06-20
  Administered 2016-06-15 – 2016-06-19 (×5): 10 mg via ORAL
  Filled 2016-06-15 (×5): qty 1

## 2016-06-15 MED ORDER — PENTAFLUOROPROP-TETRAFLUOROETH EX AERO: 1.0000 "application " | INHALATION_SPRAY | CUTANEOUS | Status: DC | PRN

## 2016-06-15 MED ORDER — OXYCODONE HCL 5 MG PO TABS
5.0000 mg | ORAL_TABLET | Freq: Four times a day (QID) | ORAL | Status: DC | PRN
  Administered 2016-06-15 – 2016-06-20 (×9): 5 mg via ORAL
  Filled 2016-06-15 (×8): qty 1

## 2016-06-15 MED ORDER — LIDOCAINE-PRILOCAINE 2.5-2.5 % EX CREA: 1.0000 "application " | TOPICAL_CREAM | CUTANEOUS | Status: DC | PRN

## 2016-06-15 MED ORDER — OXYCODONE HCL 10 MG PO TABS: 10.0000 mg | ORAL_TABLET | Freq: Four times a day (QID) | ORAL | Status: DC | PRN

## 2016-06-15 MED ORDER — OXYCODONE HCL 5 MG PO TABS: 10.0000 mg | ORAL_TABLET | Freq: Four times a day (QID) | ORAL | Status: DC | PRN

## 2016-06-15 MED ORDER — FENTANYL BOLUS VIA INFUSION
50.0000 ug | INTRAVENOUS | Status: DC | PRN
  Administered 2016-06-15 (×3): 50 ug via INTRAVENOUS
  Filled 2016-06-15: qty 50

## 2016-06-15 NOTE — Care Management Note (Signed)
Case Management Note Donn PieriniKristi Ryle Buscemi RN, BSN Unit 2W-Case Manager-- 2H coverage [phone]  Patient Details  Name: Jonathan Terry MRN: [id number] Date of Birth: [birth year]  Subjective/Objective:   Pt admitted s/p PEA arrest- was on bus in route to HD- hx ESRD- currently intubated on vent                 Action/Plan: PTA pt lived at home- independent- has girlfriend- CM to follow   Expected Discharge Date:  06/25/16               Expected Discharge Plan:     In-House Referral:     Discharge planning Services  CM Consult  Post Acute Care Choice:    Choice offered to:     DME Arranged:    DME Agency:     HH Arranged:    HH Agency:     Status of Service:  In process, will continue to follow  If discussed at Long Length of Stay Meetings, dates discussed:    Additional Comments:  Darrold SpanWebster, Rebecca Cairns Hall, RN 06/15/2016, 11:49 AM

## 2016-06-15 NOTE — Progress Notes (Signed)
Neurology Progress Note  Subjective: Much improved this morning. He opens his eyes to verbal and tactile stimulation and fixes and tracks. His GF at the bedside reports that he was able to nod and shake his head appropriately for her when she was speaking with her last night. No major overnight events reported. He remains intubated and is on a fentanyl drip. He indicates that he wants the ETT out and tries to communicate via gestures.   Medications reviewed and reconciled.   Pertinent meds: Fentanyl drip 150 mcg/hr Keppra 250 mg daily  Current Meds:   Current Facility-Administered Medications:  .  0.9 %  sodium chloride infusion, , Intravenous, Continuous, Sood, Vineet, MD, Last Rate: 50 mL/hr at 06/14/16 1600 .  aspirin EC tablet 81 mg, 81 mg, Oral, Daily, Anders Simmonds, MD, 81 mg at 06/14/16 2240 .  bisacodyl (DULCOLAX) suppository 10 mg, 10 mg, Rectal, Daily PRN, Chesley Mires, MD .  chlorhexidine gluconate (MEDLINE KIT) (PERIDEX) 0.12 % solution 15 mL, 15 mL, Mouth Rinse, BID, Halford Chessman, Vineet, MD, 15 mL at 06/14/16 2006 .  docusate (COLACE) 50 MG/5ML liquid 100 mg, 100 mg, Per Tube, BID PRN, Chesley Mires, MD .  fentaNYL (SUBLIMAZE) bolus via infusion 50 mcg, 50 mcg, Intravenous, Q1H PRN, Raylene Miyamoto, MD, 50 mcg at 06/15/16 0555 .  fentaNYL (SUBLIMAZE) injection 100 mcg, 100 mcg, Intravenous, Q2H PRN, Chesley Mires, MD, 100 mcg at 06/15/16 0250 .  fentaNYL (SUBLIMAZE) injection 50 mcg, 50 mcg, Intravenous, Once, Raylene Miyamoto, MD .  fentaNYL 252mg in NS 2542m(1033mml) infusion-PREMIX, 25-400 mcg/hr, Intravenous, Continuous, FeiRaylene MiyamotoD, Last Rate: 15 mL/hr at 06/15/16 0554, 150 mcg/hr at 06/15/16 0554 .  heparin injection 5,000 Units, 5,000 Units, Subcutaneous, Q8H, Sood, Vineet, MD, 5,000 Units at 06/15/16 0557 .  hydrALAZINE (APRESOLINE) injection 10 mg, 10 mg, Intravenous, Q4H PRN, SooChesley MiresD, 10 mg at 06/14/16 1615 .  levETIRAcetam (KEPPRA) 250 mg in  sodium chloride 0.9 % 100 mL IVPB, 250 mg, Intravenous, Q24H, Eubanks, KatDanie ChandlerP, Stopped at 06/14/16 1800 .  MEDLINE mouth rinse, 15 mL, Mouth Rinse, QID, Sood, Vineet, MD, 15 mL at 06/15/16 0340 .  midazolam (VERSED) injection 2 mg, 2 mg, Intravenous, Q2H PRN, SooChesley MiresD, 2 mg at 06/15/16 0553 .  pantoprazole (PROTONIX) injection 40 mg, 40 mg, Intravenous, Q24H, SooChesley MiresD, 40 mg at 06/14/16 2115  Objective:  Temp:  [87.6 F (30.9 C)-99.3 F (37.4 C)] 97.9 F (36.6 C) (06/05 0700) Pulse Rate:  [27-126] 53 (06/05 0700) Resp:  [10-28] 17 (06/05 0700) BP: (56-268)/(25-155) 106/54 (06/05 0700) SpO2:  [97 %-100 %] 99 % (06/05 0736) FiO2 (%):  [40 %] 40 % (06/05 0736) Weight:  [63.9 kg (140 lb 14 oz)] 63.9 kg (140 lb 14 oz) (06/04 1545)  General: WD man lying in ICU bed, intubated with fentanyl drip running. He opens his eyes to voice and attempts to communicate with gestures. He fixes and tracks. He did not consistently follow commands for me.  HEENT: Neck is supple without lymphadenopathy. ETT, OGT in place. Sclerae are anicteric. There is mild conjunctival injection.  CV: BraLoletha Grayeregular, no murmur. Carotid pulses are 2+ and symmetric with no bruits. Distal pulses 2+ and symmetric.  Lungs: CTAB with some coarse upper airway sounds on anterior auscultation. Ventilated.   Neuro: MS: As noted above.  CN: Pupils are slightly unequal with the right slightly larger. Both are sluggishly reactive with rapid rebound. He blinks to threat  x4. Eyes are conjugate and he tracks with full horizontal saccades. Corneals are intact and symmetric. His face appears symmetric at rest and he has a symmetric grimace, but the lower part of his face is partly obscured by tubes and tape. B SCMs show normal strength. Cough is intact.   Motor: Normal bulk, tone. He moves all four limbs spontaneously with grossly normal strength. He does nto participate with confrontational testing.  Sensation: He  withdraws from minimal noxious stimulation x4.  DTRs: 2+, symmetric. Toes are downgoing bilaterally. No pathological reflexes.  Coordination: No gross dysmetria.    Labs: Lab Results  Component Value Date   WBC 11.8 (H) 06/14/2016   HGB 11.7 (L) 06/14/2016   HCT 34.3 (L) 06/14/2016   PLT 215 06/14/2016   GLUCOSE 117 (H) 06/14/2016   ALT 39 06/14/2016   AST 94 (H) 06/14/2016   NA 136 06/14/2016   K 5.5 (H) 06/14/2016   CL 95 (L) 06/14/2016   CREATININE 7.37 (H) 06/14/2016   BUN 60 (H) 06/14/2016   CO2 27 06/14/2016   INR 1.35 06/14/2016   CBC Latest Ref Rng & Units 06/14/2016 06/14/2016  WBC 4.0 - 10.5 K/uL 11.8(H) -  Hemoglobin 13.0 - 17.0 g/dL 11.7(L) 10.5(L)  Hematocrit 39.0 - 52.0 % 34.3(L) 31.0(L)  Platelets 150 - 400 K/uL 215 -    No results found for: HGBA1C Lab Results  Component Value Date   ALT 39 06/14/2016   AST 94 (H) 06/14/2016   ALKPHOS 209 (H) 06/14/2016   BILITOT 1.2 06/14/2016    Radiology:  There is no new neuroimaging for review.   Other diagnostic studies:  EEG showed diffuse suppression and slowing of the background, no epileptiform activity and no seizures.   A/P:   1. 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 Initial rhythm was noted to be PEA. He was hypothermic on arrival in the ED so therapeutic hypothermia was not indicated. He is much improved this morning, suggesting that his injury is not as severe as previously thought. This may support the initial ED triage RN's documentation that he had ROSC after 8 minutes in the field rather than the 20-25 minutes reported in other notes. Management at this time is supportive as noted below.   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 The current examination shows a patient who is alert, following some commands with no focal deficits and no further myoclonus. Continue with supportive care. Avoid hypoxemia and hypotension for even brief  intervals as these are associated with worse neurologic outcomes. Fever and hyperglycemia must be aggressively treated for the same reason. Prognosis for meaningful neurologic recovery is excellent given today's exam. My main concern would be the possibility of new cognitive impairment following his injury. His GF reports that he has had some memory loss prior to this event and this could certainly be worse now that he has suffered an anoxic injury. This can be assessed in time, once his acute medical issues have fully resolved. He would likely benefit from formal cognitive assessment by SLP/OT in time.    3. Myoclonus: He was noted to have some myoclonic activity yesterday. EEG did not show any epileptiform activity or seizures. Continue Keppra 250 mg daily, will need to have additional dosing post-dialysis.   This was discussed with the patient's GF. Education was provided on the diagnosis and expected evaluation and treatment. She is in agreement with the plan as noted.  She was given the opportunity to ask any questions and these were addressed to her satisfaction.   Melba Coon, MD [facility]

## 2016-06-15 NOTE — Progress Notes (Signed)
PCCM Progress Note  Admission date: 06/14/2016 Referring provider: Dr. Lynelle Doctor, ER  CC: Short of breath.  HPI: 61 yo with dyspnea, dizziness leading to PEA cardiac arrest with 25 minutes for ROSC.  Hx of ESRD, pulmonary HTN.  Subjective: Had episode of myoclonus yesterday.  Now alert, and tolerating SBT.  Vital signs: BP (!) 121/59 (BP Location: Right Arm)   Pulse (!) 57   Temp 98.1 F (36.7 C) (Core (Comment))   Resp 13   Ht 5\' 4"  (1.626 m)   Wt 140 lb 14 oz (63.9 kg)   SpO2 100%   BMI 24.18 kg/m   Intake/output: I/O last 3 completed shifts: In: 1059.2 [I.V.:756.7; NG/GT:200; IV Piggyback:102.5] Out: 350 [Emesis/NG output:350]  General - pleasant Eyes - pupils reactive ENT - ETT in place Cardiac - regular, no murmur Chest - no wheeze, rales Abd - soft, non tender Ext - no edema, AV graft Lt arm Skin - no rashes Neuro - normal strength Psych - normal mood    CMP Latest Ref Rng & Units 06/14/2016 06/14/2016 06/14/2016  Glucose 65 - 99 mg/dL 161(W) 960(A) 540(J)  BUN 6 - 20 mg/dL 81(X) 91(Y) 78(G)  Creatinine 0.61 - 1.24 mg/dL 9.56(O) 1.30(Q) 6.57(Q)  Sodium 135 - 145 mmol/L 136 137 138  Potassium 3.5 - 5.1 mmol/L 5.5(H) 5.3(H) 4.5  Chloride 101 - 111 mmol/L 95(L) 97(L) 99(L)  CO2 22 - 32 mmol/L 27 25 -  Calcium 8.9 - 10.3 mg/dL 8.0(L) 8.4(L) -  Total Protein 6.5 - 8.1 g/dL - 7.3 -  Total Bilirubin 0.3 - 1.2 mg/dL - 1.2 -  Alkaline Phos 38 - 126 U/L - 209(H) -  AST 15 - 41 U/L - 94(H) -  ALT 17 - 63 U/L - 39 -     CBC Latest Ref Rng & Units 06/14/2016 06/14/2016  WBC 4.0 - 10.5 K/uL 11.8(H) -  Hemoglobin 13.0 - 17.0 g/dL 11.7(L) 10.5(L)  Hematocrit 39.0 - 52.0 % 34.3(L) 31.0(L)  Platelets 150 - 400 K/uL 215 -     ABG    Component Value Date/Time   PHART 7.430 06/14/2016 1751   PCO2ART 41.0 06/14/2016 1751   PO2ART 133.0 (H) 06/14/2016 1751   HCO3 28.1 (H) 06/14/2016 1751   TCO2 29 06/14/2016 1751   O2SAT 99.0 06/14/2016 1751     CBG (last 3)   Recent  Labs  06/14/16 2327 06/15/16 0323 06/15/16 0812  GLUCAP 102* 101* 104*     Imaging: Ct Head Wo Contrast  Result Date: 06/14/2016 CLINICAL DATA:  61 year old male became dizzy on way to dialysis. Cardiac arrest. Initial encounter. EXAM: CT HEAD WITHOUT CONTRAST TECHNIQUE: Contiguous axial images were obtained from the base of the skull through the vertex without intravenous contrast. COMPARISON:  None. FINDINGS: Brain: No intracranial hemorrhage. Infarct posterior left lenticular nucleus probably remote although age indeterminate. No CT evidence of large acute infarct. Global atrophy without hydrocephalus. No intracranial mass lesion noted on this unenhanced exam. Preservation gray-white differentiation without findings of diffuse anoxia currently. Vascular: Vascular calcifications. Skull: No acute abnormality. Sinuses/Orbits: Post lens replacement. Mild exophthalmos. No acute abnormality. Mucosal thickening maxillary sinuses and ethmoid sinus air cells bilaterally. Other: Mastoid air cells and middle ear cavities are clear. Prominent dermal calcifications possibly vascular in origin. IMPRESSION: No intracranial hemorrhage. Infarct posterior left lenticular nucleus probably remote although age indeterminate. No CT evidence of large acute infarct. Global atrophy. Maxillary sinus and ethmoid sinus air cell mucosal thickening. Electronically Signed   By: Viviann Spare  Constance Goltzlson M.D.   On: 06/14/2016 13:20   Ct Angio Chest Pe W Or Wo Contrast  Result Date: 06/14/2016 CLINICAL DATA:  Dyspnea. Intubated. Cardiac arrest en route to dialysis. EXAM: CT ANGIOGRAPHY CHEST WITH CONTRAST TECHNIQUE: Multidetector CT imaging of the chest was performed using the standard protocol during bolus administration of intravenous contrast. Multiplanar CT image reconstructions and MIPs were obtained to evaluate the vascular anatomy. CONTRAST:  80 cc Isovue 370 IV. COMPARISON:  Chest radiograph from earlier today. FINDINGS:  Cardiovascular: The study is high quality for the evaluation of pulmonary embolism. There are no filling defects in the central, lobar, segmental or subsegmental pulmonary artery branches to suggest acute pulmonary embolism. Atherosclerotic nonaneurysmal thoracic aorta. Dilated main pulmonary artery (3.5 cm diameter). Mild cardiomegaly. No significant pericardial fluid/thickening. Left anterior descending, left circumflex and right coronary atherosclerosis. Mediastinum/Nodes: No discrete thyroid nodules. Enteric tube terminates in the proximal stomach (as seen on the scout topogram). No pathologically enlarged axillary, mediastinal or hilar lymph nodes. Subcentimeter calcified bilateral hilar, AP window and subcarinal lymph nodes are presumably from prior granulomatous disease. Lungs/Pleura: No pneumothorax. Dependent small to moderate right and trace left pleural effusions. Endotracheal tube tip is 1.3 cm above the carina. Near complete left lower lobe atelectasis. Moderate compressive atelectasis in the dependent right lung. Segmental lingular atelectasis. No lung masses or significant pulmonary nodules in the limited aerated portions of the lungs. Upper abdomen: Small volume upper abdominal ascites. Musculoskeletal: No aggressive appearing focal osseous lesions. Nearly healed anterior right second through fourth rib fractures. Mild thoracic spondylosis. Review of the MIP images confirms the above findings. IMPRESSION: 1. No pulmonary embolism. 2. Endotracheal tube tip 1.3 cm above the carina. Enteric tube terminates in the proximal stomach . 3. Mild cardiomegaly. Small to moderate right and trace left dependent pleural effusions. Small volume upper abdominal ascites. 4. Dilated main pulmonary artery, suggesting pulmonary arterial hypertension . 5. Near complete left lower lobe atelectasis. Segmental lingular atelectasis. Moderate compressive atelectasis in the dependent right lung. 6. Aortic atherosclerosis.   Three-vessel coronary atherosclerosis. Electronically Signed   By: Delbert PhenixJason A Poff M.D.   On: 06/14/2016 13:32   Dg Chest Port 1 View  Result Date: 06/15/2016 CLINICAL DATA:  Respiratory failure EXAM: PORTABLE CHEST 1 VIEW COMPARISON:  06/14/2016 FINDINGS: Endotracheal tube and NG tube remain in place, unchanged. Cardiomegaly with vascular congestion. No confluent opacities or overt failure. IMPRESSION: Cardiomegaly, vascular congestion.  Improving aeration in the bases. Electronically Signed   By: Charlett NoseKevin  Dover M.D.   On: 06/15/2016 07:36   Dg Chest Port 1 View  Result Date: 06/14/2016 CLINICAL DATA:  Cardiac arrest. EXAM: PORTABLE CHEST 1 VIEW 5:56 p.m. COMPARISON:  06/14/2016 at 11:40 a.m. and chest CT dated 06/14/2016 FINDINGS: Heart size and pulmonary vascularity are normal. Pulmonary edema present on the prior study has resolved. Density at both lung bases consistent with previously demonstrated bilateral pleural effusions. Endotracheal tube and NG tube appear in good position. IMPRESSION: 1. Resolution of pulmonary edema. 2. Persistent density at the lung bases consistent with previously demonstrated effusions. Electronically Signed   By: Francene BoyersJames  Maxwell M.D.   On: 06/14/2016 18:08   Dg Chest Portable 1 View  Result Date: 06/14/2016 CLINICAL DATA:  Endotracheal tube placement EXAM: PORTABLE CHEST 1 VIEW COMPARISON:  None. FINDINGS: Endotracheal tube with tip 2.4 cm above the carina. An orogastric tube reaches the stomach at least. Cardiomegaly. Retrocardiac opacity with volume loss. Asymmetric interstitial opacity on the right. Suspect layering pleural effusions. IMPRESSION: 1. Endotracheal  tube tip 2.4 cm above the carina. The orogastric tube at least reaches the stomach. 2. Retrocardiac opacity with volume loss favoring atelectasis. 3. Suspect layering pleural effusions. Interstitial coarsening on the right which could be atelectasis, asymmetric edema, or infection. Electronically Signed   By: Marnee Spring M.D.   On: 06/14/2016 11:57     Studies: CT head 6/04 >> old Lt posterior lenticular nucleus CVA, atrophy CT angio chest 6/04 >> mod Rt and trace Lt pleural effusion, CM, ascites, dilated PA, ATX LLL, atherosclerosis EEG 6/04 >> diffuse suppression and slowing Echo 6/04 >> EF 40 to 45%, grade 1 DD, mild MR, severe LA dilation, mod RV dilation, mod TR, PAS 87 mmHg  Lines/tubes: ETT 6/04 >> 6/05  Events: 6/04 Admit, cardiology and renal consulted; myoclonus >> neuro consulte 6/05 Awake, following commands, extubated  Summary: 61 yo with PEA cardiac arrest with 25 minutes for ROSC.  Mostly likely related to acute hypoxia in setting of volume overload with hx of ESRD and pulmonary HTN.  Assessment/plan:  Acute respiratory failure in setting of PEA cardiac arrest. - extubated 6/05 - oxygen to keep SpO2 > 92% - empiric CPAP at night >> will need outpt sleep study if not done recently  PEA cardiac arrest. Hx of HTN, pulmonary HTN, CVA. Atherosclerosis noted on CT chest. - f/u with cardiology  ESRD on HD M,W, F. - HD per nephrology  Acute anoxic encephalopathy with transient myoclonus 6/04. - intake mental status 6/05 - continue keppra per neurology  Anemia of chronic disease. - f/u CBC  DVT prophylaxis - SQ heparin SUP - protonix Nutrition - renal diet Goals of care - full code  D/w Dr. Marisue Humble  CC time 33 minutes  Coralyn Helling, MD [facility] Pulmonary/Critical Care 06/15/2016, 8:46 AM Pager:  [phone] After 3pm call: [phone]

## 2016-06-15 NOTE — Procedures (Signed)
Extubation Procedure Note  Patient Details:   Name: Jonathan Terry DOB: Oct 09, 1955 MRN: [id number]   Airway Documentation:  Airway 7.5 mm (Active)  Secured at (cm) 22 cm 06/15/2016  7:36 AM  Measured From Lips 06/15/2016  7:36 AM  Secured Location Center 06/15/2016  7:36 AM  Secured By [employer] Tube Holder 06/15/2016  7:36 AM  Tube Holder Repositioned Yes 06/15/2016  7:36 AM  Cuff Pressure (cm H2O) 28 cm H2O 06/15/2016  7:36 AM  Site Condition Dry 06/15/2016  3:12 AM    Evaluation  O2 sats: stable throughout and currently acceptable Complications: No apparent complications Patient did tolerate procedure well. Bilateral Breath Sounds: Clear   Yes  Antoine Pocherogdon, Montine Hight Caroline 06/15/2016, 8:59 AM

## 2016-06-15 NOTE — Progress Notes (Signed)
Admit: 06/14/2016 LOS: 1  61M ESRD with cardiac arrest 6/4  Subjective:  Awake, alert, folloiwng ocmmands this AM K 5.5  BP improved  To be extubated this AM  06/04 0701 - 06/05 0700 In: 1059.2 [I.V.:756.7; NG/GT:200; IV Piggyback:102.5] Out: 350 [Emesis/NG output:350]  Filed Weights   06/14/16 1545  Weight: 63.9 kg (140 lb 14 oz)    Scheduled Meds: . aspirin EC  81 mg Oral Daily  . chlorhexidine gluconate (MEDLINE KIT)  15 mL Mouth Rinse BID  . heparin subcutaneous  5,000 Units Subcutaneous Q8H  . mouth rinse  15 mL Mouth Rinse QID  . pantoprazole (PROTONIX) IV  40 mg Intravenous Q24H   Continuous Infusions: . sodium chloride 50 mL/hr at 06/14/16 1600  . levETIRAcetam Stopped (06/14/16 1800)   PRN Meds:.bisacodyl, hydrALAZINE  Current Labs: reviewed    Physical Exam:  Blood pressure (!) 121/59, pulse (!) 57, temperature 98.1 F (36.7 C), temperature source Core (Comment), resp. rate 13, height 5' 4" (1.626 m), weight 63.9 kg (140 lb 14 oz), SpO2 100 %. Intubated, awake, alert RRR Coarse bs b/o No LEE +B/T of AVF S/ntn/d  Dialysis Orders: Center: [facility]   on MWF . EDW 60.0 kg HD Bath 2k, 2.0Ca   Time 4hr Heparin None . Access LUA AVF     Calcitriol 0.75 mcg IV/HD , Mircera 75mcg q 4wks  (last given 05/28/16)   Other  OP labs = hgb 11.1 06/09/16  pth 314   Ca 9.3  Phos  2.9   A 1. ESRD MWF [place] LUE AVF 2. PEA Cardiac Arrest 06/14/16 3. Hyperkalemia, mild 4. HTN, improved this AM 5. Anemia, stable 6. CKD BMD  P 1. HD today, 2K, 2L UF, AVF. No heparin. In 2H with recent arrest   Ryan Sanford MD 06/15/2016, 8:38 AM   Recent Labs Lab 06/14/16 1132 06/14/16 1543 06/14/16 2041  NA 138 137 136  K 4.5 5.3* 5.5*  CL 99* 97* 95*  CO2  --  25 27  GLUCOSE 137* 136* 117*  BUN 69* 56* 60*  CREATININE 6.80* 7.22* 7.37*  CALCIUM  --  8.4* 8.0*    Recent Labs Lab 06/14/16 1132 06/14/16 1543  WBC  --  11.8*  NEUTROABS  --  10.2*  HGB 10.5* 11.7*  HCT  31.0* 34.3*  MCV  --  87.5  PLT  --  215            d 

## 2016-06-15 NOTE — Progress Notes (Signed)
eLink Physician-Brief Progress Note Patient Name: Jonathan Terry DOB: [birth year] MRN: [id number]   Date of Service  06/15/2016  HPI/Events of Note  fsiled in fent, start driop pad  eICU Interventions       Intervention Category Intermediate Interventions: Best-practice therapies (e.g. DVT, beta blocker, etc.);Change in mental status - evaluation and management  FEINSTEIN,DANIEL J. 06/15/2016, 2:59 AM

## 2016-06-15 NOTE — Progress Notes (Addendum)
Progress Note  Patient Name: Jonathan Terry Date of Encounter: 06/15/2016  Primary Cardiologist: Noa Constante-new  Subjective   Girlfriend is at the bedside. Still obtunded. Various reports of some movement and possibly appropriately responding to communication. Girlfriend states there was no chest pain or shortness of breath. She states he had Seroquil prior to the event yesterday. She believes he taken that evening prior. It always causes him to become very foggy, and most of the time he will not use it. She doesn't know if he used any of the medication on the morning of his event.  Inpatient Medications    Scheduled Meds: . aspirin EC  81 mg Oral Daily  . chlorhexidine gluconate (MEDLINE KIT)  15 mL Mouth Rinse BID  . heparin subcutaneous  5,000 Units Subcutaneous Q8H  . mouth rinse  15 mL Mouth Rinse QID  . pantoprazole (PROTONIX) IV  40 mg Intravenous Q24H  . rosuvastatin  10 mg Oral q1800   Continuous Infusions: . sodium chloride 30 mL/hr at 06/15/16 0835  . levETIRAcetam Stopped (06/14/16 1800)   PRN Meds: bisacodyl, hydrALAZINE   Vital Signs    Vitals:   06/15/16 0630 06/15/16 0700 06/15/16 0736 06/15/16 0800  BP: (!) 108/54 (!) 106/54  (!) 121/59  Pulse: (!) 55 (!) 53  (!) 57  Resp: 17 17  13   Temp: 97.9 F (36.6 C) 97.9 F (36.6 C)  98.1 F (36.7 C)  TempSrc:    Core (Comment)  SpO2: 100% 100% 99% 100%  Weight:      Height:        Intake/Output Summary (Last 24 hours) at 06/15/16 0956 Last data filed at 06/15/16 0900  Gross per 24 hour  Intake          1247.55 ml  Output              350 ml  Net           897.55 ml   Filed Weights   06/14/16 1545  Weight: 140 lb 14 oz (63.9 kg)    Telemetry    Normal sinus rhythm/sinus bradycardia - Personally Reviewed  ECG    Sinus bradycardia with nonspecific ST-T wave abnormality. - Personally Reviewed  Physical Exam  Intubated and motionless. GEN: No acute distress.   Neck: No JVD Cardiac: RRR, no murmurs,  rubs, or gallops. Support apparatus makes examination difficult. Respiratory: Clear to auscultation bilaterally. GI: Soft, nontender, non-distended  MS: No edema; No deformity. Neuro:  Nonfocal  Psych: Normal affect   Labs    Chemistry Recent Labs Lab 06/14/16 1132 06/14/16 1543 06/14/16 2041  NA 138 137 136  K 4.5 5.3* 5.5*  CL 99* 97* 95*  CO2  --  25 27  GLUCOSE 137* 136* 117*  BUN 69* 56* 60*  CREATININE 6.80* 7.22* 7.37*  CALCIUM  --  8.4* 8.0*  PROT  --  7.3  --   ALBUMIN  --  3.1*  --   AST  --  94*  --   ALT  --  39  --   ALKPHOS  --  209*  --   BILITOT  --  1.2  --   GFRNONAA  --  7* 7*  GFRAA  --  8* 8*  ANIONGAP  --  15 14     Hematology Recent Labs Lab 06/14/16 1132 06/14/16 1543  WBC  --  11.8*  RBC  --  3.92*  HGB 10.5* 11.7*  HCT 31.0* 34.3*  MCV  --  87.5  MCH  --  29.8  MCHC  --  34.1  RDW  --  16.5*  PLT  --  215    Cardiac Enzymes Recent Labs Lab 06/14/16 1543 06/14/16 2041  TROPONINI 0.08* 0.19*    Recent Labs Lab 06/14/16 1130  TROPIPOC 0.02     BNPNo results for input(s): BNP, PROBNP in the last 168 hours.   DDimer No results for input(s): DDIMER in the last 168 hours.   Radiology    Ct Head Wo Contrast  Result Date: 06/14/2016 CLINICAL DATA:  61 year old male became dizzy on way to dialysis. Cardiac arrest. Initial encounter. EXAM: CT HEAD WITHOUT CONTRAST TECHNIQUE: Contiguous axial images were obtained from the base of the skull through the vertex without intravenous contrast. COMPARISON:  None. FINDINGS: Brain: No intracranial hemorrhage. Infarct posterior left lenticular nucleus probably remote although age indeterminate. No CT evidence of large acute infarct. Global atrophy without hydrocephalus. No intracranial mass lesion noted on this unenhanced exam. Preservation gray-white differentiation without findings of diffuse anoxia currently. Vascular: Vascular calcifications. Skull: No acute abnormality. Sinuses/Orbits:  Post lens replacement. Mild exophthalmos. No acute abnormality. Mucosal thickening maxillary sinuses and ethmoid sinus air cells bilaterally. Other: Mastoid air cells and middle ear cavities are clear. Prominent dermal calcifications possibly vascular in origin. IMPRESSION: No intracranial hemorrhage. Infarct posterior left lenticular nucleus probably remote although age indeterminate. No CT evidence of large acute infarct. Global atrophy. Maxillary sinus and ethmoid sinus air cell mucosal thickening. Electronically Signed   By: Genia Del M.D.   On: 06/14/2016 13:20   Ct Angio Chest Pe W Or Wo Contrast  Result Date: 06/14/2016 CLINICAL DATA:  Dyspnea. Intubated. Cardiac arrest en route to dialysis. EXAM: CT ANGIOGRAPHY CHEST WITH CONTRAST TECHNIQUE: Multidetector CT imaging of the chest was performed using the standard protocol during bolus administration of intravenous contrast. Multiplanar CT image reconstructions and MIPs were obtained to evaluate the vascular anatomy. CONTRAST:  80 cc Isovue 370 IV. COMPARISON:  Chest radiograph from earlier today. FINDINGS: Cardiovascular: The study is high quality for the evaluation of pulmonary embolism. There are no filling defects in the central, lobar, segmental or subsegmental pulmonary artery branches to suggest acute pulmonary embolism. Atherosclerotic nonaneurysmal thoracic aorta. Dilated main pulmonary artery (3.5 cm diameter). Mild cardiomegaly. No significant pericardial fluid/thickening. Left anterior descending, left circumflex and right coronary atherosclerosis. Mediastinum/Nodes: No discrete thyroid nodules. Enteric tube terminates in the proximal stomach (as seen on the scout topogram). No pathologically enlarged axillary, mediastinal or hilar lymph nodes. Subcentimeter calcified bilateral hilar, AP window and subcarinal lymph nodes are presumably from prior granulomatous disease. Lungs/Pleura: No pneumothorax. Dependent small to moderate right and trace  left pleural effusions. Endotracheal tube tip is 1.3 cm above the carina. Near complete left lower lobe atelectasis. Moderate compressive atelectasis in the dependent right lung. Segmental lingular atelectasis. No lung masses or significant pulmonary nodules in the limited aerated portions of the lungs. Upper abdomen: Small volume upper abdominal ascites. Musculoskeletal: No aggressive appearing focal osseous lesions. Nearly healed anterior right second through fourth rib fractures. Mild thoracic spondylosis. Review of the MIP images confirms the above findings. IMPRESSION: 1. No pulmonary embolism. 2. Endotracheal tube tip 1.3 cm above the carina. Enteric tube terminates in the proximal stomach . 3. Mild cardiomegaly. Small to moderate right and trace left dependent pleural effusions. Small volume upper abdominal ascites. 4. Dilated main pulmonary artery, suggesting pulmonary arterial hypertension . 5. Near complete left lower lobe atelectasis. Segmental  lingular atelectasis. Moderate compressive atelectasis in the dependent right lung. 6. Aortic atherosclerosis.  Three-vessel coronary atherosclerosis. Electronically Signed   By: Ilona Sorrel M.D.   On: 06/14/2016 13:32   Dg Chest Port 1 View  Result Date: 06/15/2016 CLINICAL DATA:  Respiratory failure EXAM: PORTABLE CHEST 1 VIEW COMPARISON:  06/14/2016 FINDINGS: Endotracheal tube and NG tube remain in place, unchanged. Cardiomegaly with vascular congestion. No confluent opacities or overt failure. IMPRESSION: Cardiomegaly, vascular congestion.  Improving aeration in the bases. Electronically Signed   By: Rolm Baptise M.D.   On: 06/15/2016 07:36   Dg Chest Port 1 View  Result Date: 06/14/2016 CLINICAL DATA:  Cardiac arrest. EXAM: PORTABLE CHEST 1 VIEW 5:56 p.m. COMPARISON:  06/14/2016 at 11:40 a.m. and chest CT dated 06/14/2016 FINDINGS: Heart size and pulmonary vascularity are normal. Pulmonary edema present on the prior study has resolved. Density at both  lung bases consistent with previously demonstrated bilateral pleural effusions. Endotracheal tube and NG tube appear in good position. IMPRESSION: 1. Resolution of pulmonary edema. 2. Persistent density at the lung bases consistent with previously demonstrated effusions. Electronically Signed   By: Lorriane Shire M.D.   On: 06/14/2016 18:08   Dg Chest Portable 1 View  Result Date: 06/14/2016 CLINICAL DATA:  Endotracheal tube placement EXAM: PORTABLE CHEST 1 VIEW COMPARISON:  None. FINDINGS: Endotracheal tube with tip 2.4 cm above the carina. An orogastric tube reaches the stomach at least. Cardiomegaly. Retrocardiac opacity with volume loss. Asymmetric interstitial opacity on the right. Suspect layering pleural effusions. IMPRESSION: 1. Endotracheal tube tip 2.4 cm above the carina. The orogastric tube at least reaches the stomach. 2. Retrocardiac opacity with volume loss favoring atelectasis. 3. Suspect layering pleural effusions. Interstitial coarsening on the right which could be atelectasis, asymmetric edema, or infection. Electronically Signed   By: Monte Fantasia M.D.   On: 06/14/2016 11:57    Cardiac Studies   ECHOCARDIOGRAM 06/14/16: Study Conclusions  - Left ventricle: The cavity size was mildly dilated. There was   mild concentric hypertrophy. Systolic function was mildly to   moderately reduced. The estimated ejection fraction was in the   range of 40% to 45%. Diffuse hypokinesis. Doppler parameters are   consistent with abnormal left ventricular relaxation (grade 1   diastolic dysfunction). Doppler parameters are consistent with   elevated ventricular end-diastolic filling pressure. - Aortic valve: There was trivial regurgitation. - Mitral valve: Calcified annulus. Mildly thickened leaflets .   There was mild regurgitation. - Left atrium: The atrium was severely dilated. - Right ventricle: The cavity size was moderately dilated. Wall   thickness was normal. Systolic function was  mildly reduced. - Right atrium: The atrium was severely dilated. - Tricuspid valve: There was moderate regurgitation. - Pulmonic valve: There was trivial regurgitation. - Pulmonary arteries: Systolic pressure was severely increased. PA   peak pressure: 87 mm Hg (S). - Pericardium, extracardiac: A mild pericardial effusion was   identified posterior to the heart. Features were not consistent   with tamponade physiology.  Impressions:  - LVEF 40-45%, mild diffuse hypokinesis, moderate concentric LVH.   RV is moderately dilated with moderately decreased RV systolic   function.   Severe pulmonary hypertension, RVSP 87 mmHg.   Severe biatrial dilatation.    Patient Profile     61 y.o. male with PMH of ESRD on HD, Pulmonary HTN, GERD, HTN, anemia, CVA and Tobacco/ETOH use who presented after a cardiac arrest. No prior history of cardiac disease, recent low risk myocardial perfusion  study and prior documentation of normal EF (late 2017). Recent documentation of severe pulmonary hypertension by echo and right heart catheterization in late 2017.  Assessment & Plan    1. PEA/bradycardia asystolic cardiac arrest, possibly related to severe pulmonary hypertension and reduced left heart filling pressures. Rule out other etiology. 2. Chronic combined systolic and diastolic heart failure with mild depression in LV systolic function, some of which is likely related to CPR. LVEF she had improved back to normal. 3. End stage kidney disease on dialysis, will need dialysis 4. Severe pulmonary hypertension, etiology needs to be identified. He has acute on chronic cor pulmonale. No evidence of pulmonary emboli. 5. Evidence of CVA on scan, age is uncertain. 6. Acute hypoxic respiratory failure, multifactorial  Overall plan is continued support. Determine if there is significant neurological injury. Once more stable, further evaluation and management of pulmonary hypertension will be indicated. I am  relatively comfortable that he did not have saddle pulmonary embolus or large embolic burden aced upon negative CT scan done yesterday. We will need to have expertise concerning pulmonary hypertension management if he recovers.  Critical care time 35 minutes  Signed, Sinclair Grooms, MD  06/15/2016, 9:56 AM

## 2016-06-15 NOTE — Progress Notes (Signed)
WIS 150ml fentanyl with Verdon Cumminsjesse c, Charity fundraiserN.

## 2016-06-16 DIAGNOSIS — I5043 Acute on chronic combined systolic (congestive) and diastolic (congestive) heart failure: Secondary | ICD-10-CM

## 2016-06-16 LAB — RENAL FUNCTION PANEL
Albumin: 2.5 g/dL — ABNORMAL LOW (ref 3.5–5.0)
Anion gap: 14 (ref 5–15)
BUN: 44 mg/dL — AB (ref 6–20)
CHLORIDE: 94 mmol/L — AB (ref 101–111)
CO2: 28 mmol/L (ref 22–32)
CREATININE: 5.76 mg/dL — AB (ref 0.61–1.24)
Calcium: 8 mg/dL — ABNORMAL LOW (ref 8.9–10.3)
GFR calc Af Amer: 11 mL/min — ABNORMAL LOW (ref >60)
GFR calc non Af Amer: 9 mL/min — ABNORMAL LOW (ref >60)
Glucose, Bld: 122 mg/dL — ABNORMAL HIGH (ref 65–99)
Phosphorus: 3.3 mg/dL (ref 2.5–4.6)
Potassium: 4.2 mmol/L (ref 3.5–5.1)
Sodium: 136 mmol/L (ref 135–145)

## 2016-06-16 LAB — TSH: TSH: 3.696 u[IU]/mL (ref 0.350–4.500)

## 2016-06-16 LAB — CBC
HCT: 30.1 % — ABNORMAL LOW (ref 39.0–52.0)
Hemoglobin: 9.8 g/dL — ABNORMAL LOW (ref 13.0–17.0)
MCH: 28.2 pg (ref 26.0–34.0)
MCHC: 32.6 g/dL (ref 30.0–36.0)
MCV: 86.7 fL (ref 78.0–100.0)
PLATELETS: 224 10*3/uL (ref 150–400)
RBC: 3.47 MIL/uL — ABNORMAL LOW (ref 4.22–5.81)
RDW: 17 % — AB (ref 11.5–15.5)
WBC: 9.2 10*3/uL (ref 4.0–10.5)

## 2016-06-16 LAB — PROTIME-INR
INR: 1.21
Prothrombin Time: 15.3 seconds — ABNORMAL HIGH (ref 11.4–15.2)

## 2016-06-16 MED ORDER — ASPIRIN 81 MG PO CHEW
81.0000 mg | CHEWABLE_TABLET | ORAL | Status: AC
  Administered 2016-06-16: 81 mg via ORAL
  Filled 2016-06-16: qty 1

## 2016-06-16 MED ORDER — HYDRALAZINE HCL 20 MG/ML IJ SOLN
10.0000 mg | INTRAMUSCULAR | Status: DC | PRN
  Administered 2016-06-16: 10 mg via INTRAVENOUS
  Filled 2016-06-16: qty 1

## 2016-06-16 MED ORDER — SODIUM CHLORIDE 0.9 % IV SOLN: INTRAVENOUS | Status: DC

## 2016-06-16 MED ORDER — HYDRALAZINE HCL 20 MG/ML IJ SOLN
10.0000 mg | INTRAMUSCULAR | Status: DC | PRN
  Administered 2016-06-16 – 2016-06-18 (×4): 10 mg via INTRAVENOUS
  Filled 2016-06-16 (×4): qty 1

## 2016-06-16 MED ORDER — NEPRO/CARBSTEADY PO LIQD
237.0000 mL | Freq: Two times a day (BID) | ORAL | Status: DC
  Administered 2016-06-16 – 2016-06-20 (×5): 237 mL via ORAL
  Filled 2016-06-16 (×3): qty 237

## 2016-06-16 MED ORDER — SODIUM CHLORIDE 0.9% FLUSH
3.0000 mL | Freq: Two times a day (BID) | INTRAVENOUS | Status: DC
  Administered 2016-06-16: 3 mL via INTRAVENOUS

## 2016-06-16 MED ORDER — SODIUM CHLORIDE 0.9 % IV SOLN: 250.0000 mL | INTRAVENOUS | Status: DC | PRN

## 2016-06-16 MED ORDER — ASPIRIN EC 81 MG PO TBEC
81.0000 mg | DELAYED_RELEASE_TABLET | Freq: Every day | ORAL | Status: DC
  Administered 2016-06-18 – 2016-06-20 (×3): 81 mg via ORAL
  Filled 2016-06-16 (×3): qty 1

## 2016-06-16 MED ORDER — SODIUM CHLORIDE 0.9% FLUSH: 3.0000 mL | INTRAVENOUS | Status: DC | PRN

## 2016-06-16 NOTE — Progress Notes (Signed)
Admit: 06/14/2016 LOS: 2  47M ESRD with cardiac arrest 6/4  Subjective:  Feels well, back to baseline from my past experiences with him HD yesterday, 2L UF, full tx For LHC/RHC eval today  06/05 0701 - 06/06 0700 In: 1310.9 [P.O.:360; I.V.:848.4; IV Piggyback:102.5] Out: 2000   Filed Weights   06/14/16 1545 06/15/16 1215 06/15/16 1538  Weight: 63.9 kg (140 lb 14 oz) 64.4 kg (141 lb 15.6 oz) 62.4 kg (137 lb 9.1 oz)    Scheduled Meds: . aspirin  81 mg Oral Pre-Cath  . [START ON 06/17/2016] aspirin EC  81 mg Oral Daily  . heparin subcutaneous  5,000 Units Subcutaneous Q8H  . pantoprazole (PROTONIX) IV  40 mg Intravenous Q24H  . rosuvastatin  10 mg Oral q1800  . sodium chloride flush  3 mL Intravenous Q12H   Continuous Infusions: . sodium chloride 30 mL/hr at 06/16/16 0800  . sodium chloride    . sodium chloride    . sodium chloride    . sodium chloride     PRN Meds:.sodium chloride, sodium chloride, sodium chloride, acetaminophen, bisacodyl, hydrALAZINE, lidocaine (PF), lidocaine-prilocaine, oxyCODONE, pentafluoroprop-tetrafluoroeth, sodium chloride flush  Current Labs: reviewed    Physical Exam:  Blood pressure (!) 149/75, pulse 62, temperature 98.5 F (36.9 C), temperature source Oral, resp. rate 12, height 5\' 4"  (1.626 m), weight 62.4 kg (137 lb 9.1 oz), SpO2 96 %. awake, alert RRR Coarse bs b/o No LEE +B/T of AVF S/ntn/d  Dialysis Orders: Center: [facility]  on MWF . EDW 60.0 kg HD Bath 2k, 2.0Ca   Time 4hr Heparin None . Access LUA AVF     Calcitriol 0.75 mcg IV/HD , Mircera 75mcg q 4wks  (last given 05/28/16)   Other  OP labs = hgb 11.1 06/09/16  pth 314   Ca 9.3  Phos  2.9   A 1. ESRD MWF [facility] LUE AVF 2. PEA Cardiac Arrest 06/14/16 3. Hyperkalemia, resolved 4. HTN, improved this AM 5. Anemia, stable 6. CKD BMD  P 1. HD tomorrow; will need to get back onto schedule; pull to EDW not to exceed 3.5L UF, 2K, AVF. No heparin.   Sabra Heckyan Liberty Stead MD 06/16/2016, 8:23  AM   Recent Labs Lab 06/14/16 2041 06/15/16 1057 06/16/16 0500  NA 136 139 136  K 5.5* 5.1 4.2  CL 95* 98* 94*  CO2 27 28 28   GLUCOSE 117* 99 122*  BUN 60* 68* 44*  CREATININE 7.37* 7.89* 5.76*  CALCIUM 8.0* 8.2* 8.0*  PHOS  --  4.0 3.3    Recent Labs Lab 06/14/16 1543 06/15/16 1057 06/16/16 0500  WBC 11.8* 11.7* 9.2  NEUTROABS 10.2*  --   --   HGB 11.7* 10.5* 9.8*  HCT 34.3* 32.3* 30.1*  MCV 87.5 84.3 86.7  PLT 215 218 224            d

## 2016-06-16 NOTE — Progress Notes (Signed)
Neurology Progress Note  Subjective: Extubated without difficulty yesterday. He reports that he is doing well this morning. The only complaint he reports on ROS is some mild chest discomfort. He feels like his thinking is normal and he has not appreciated any difficulty with concentration or focus. He does not recall the events prior to his extubation but memory since seems good.   Medications reviewed and reconciled.   Pertinent meds: Keppra 250 mg daily  Current Meds:   Current Facility-Administered Medications:  .  0.9 %  sodium chloride infusion, , Intravenous, Continuous, Sood, Vineet, MD, Last Rate: 30 mL/hr at 06/16/16 0622 .  0.9 %  sodium chloride infusion, 100 mL, Intravenous, PRN, Marisue Humble, Ryan B, MD .  0.9 %  sodium chloride infusion, 100 mL, Intravenous, PRN, Sabra Heck B, MD .  acetaminophen (TYLENOL) tablet 650 mg, 650 mg, Oral, Q6H PRN, Coralyn Helling, MD .  aspirin EC tablet 81 mg, 81 mg, Oral, Daily, Karl Ito, MD, 81 mg at 06/15/16 1112 .  bisacodyl (DULCOLAX) suppository 10 mg, 10 mg, Rectal, Daily PRN, Coralyn Helling, MD .  heparin injection 5,000 Units, 5,000 Units, Subcutaneous, Q8H, Sood, Vineet, MD, 5,000 Units at 06/16/16 1610 .  hydrALAZINE (APRESOLINE) injection 10 mg, 10 mg, Intravenous, Q4H PRN, Coralyn Helling, MD, 10 mg at 06/14/16 1615 .  levETIRAcetam (KEPPRA) 250 mg in sodium chloride 0.9 % 100 mL IVPB, 250 mg, Intravenous, Q24H, Eubanks, Katalina M, NP, Last Rate: 410 mL/hr at 06/15/16 1800, 250 mg at 06/15/16 1800 .  lidocaine (PF) (XYLOCAINE) 1 % injection 5 mL, 5 mL, Intradermal, PRN, Marisue Humble, Ryan B, MD .  lidocaine-prilocaine (EMLA) cream 1 application, 1 application, Topical, PRN, Sabra Heck B, MD .  oxyCODONE (Oxy IR/ROXICODONE) immediate release tablet 5 mg, 5 mg, Oral, Q6H PRN, Coralyn Helling, MD, 5 mg at 06/16/16 0347 .  pantoprazole (PROTONIX) injection 40 mg, 40 mg, Intravenous, Q24H, Sood, Vineet, MD, 40 mg at 06/15/16 2100 .   pentafluoroprop-tetrafluoroeth (GEBAUERS) aerosol 1 application, 1 application, Topical, PRN, Sabra Heck B, MD .  rosuvastatin (CRESTOR) tablet 10 mg, 10 mg, Oral, q1800, Coralyn Helling, MD, 10 mg at 06/15/16 1808  Objective:  Temp:  [97.5 F (36.4 C)-98.5 F (36.9 C)] 98.3 F (36.8 C) (06/06 0400) Pulse Rate:  [52-65] 62 (06/06 0700) Resp:  [6-17] 12 (06/06 0700) BP: (108-176)/(51-116) 149/64 (06/06 0700) SpO2:  [93 %-100 %] 95 % (06/06 0700) FiO2 (%):  [40 %] 40 % (06/05 0800) Weight:  [62.4 kg (137 lb 9.1 oz)-64.4 kg (141 lb 15.6 oz)] 62.4 kg (137 lb 9.1 oz) (06/05 1538)  General: WD man resting comfortably in bed, NAD. He is alert, oriented to self. He knows he is in the hospital, unable to name it. He is disoriented to time, including year. English is somewhat broken.  Speech is clear. Follows commands well but sometimes has mild delay--? Slightly impaired speed of processing vs language barrier HEENT: Neck is supple without lymphadenopathy. MMM, OP clear. Sclerae are anicteric. There is mild conjunctival injection.  CV: Regular, no murmur. Carotid pulses are 2+ and symmetric with no bruits. Distal pulses 2+ and symmetric.  Lungs: CTAB on anterior auscultation.  Neuro: MS: As noted above.  CN: Pupils are slightly unequal with the right slightly larger. Both are reactive from 3-2 mm. Visual fields appear full. EOMI with some breakup of smooth pursuits. Facial sensation is intact to light touch. His face is symmetric at rest with normal strength and mobility. Hearing appears to  be intact to conversational voice. Uvula is midline. SCMs 5/5 bilaterally. Tongue is midline.  Motor: Normal bulk, tone, strength. No abnormal movements.  Sensation: Reports intact to light touch throughout.  DTRs: 2+, symmetric. Toes are downgoing bilaterally. No pathological reflexes.  Coordination: No dysmetria with FTN or HTS.    Labs: Lab Results  Component Value Date   WBC 9.2 06/16/2016   HGB 9.8 (L)  06/16/2016   HCT 30.1 (L) 06/16/2016   PLT 224 06/16/2016   GLUCOSE 122 (H) 06/16/2016   ALT 25 06/15/2016   AST 34 06/15/2016   NA 136 06/16/2016   K 4.2 06/16/2016   CL 94 (L) 06/16/2016   CREATININE 5.76 (H) 06/16/2016   BUN 44 (H) 06/16/2016   CO2 28 06/16/2016   INR 1.35 06/14/2016   CBC Latest Ref Rng & Units 06/16/2016 06/15/2016 06/14/2016  WBC 4.0 - 10.5 K/uL 9.2 11.7(H) 11.8(H)  Hemoglobin 13.0 - 17.0 g/dL 1.6(X9.8(L) 10.5(L) 11.7(L)  Hematocrit 39.0 - 52.0 % 30.1(L) 32.3(L) 34.3(L)  Platelets 150 - 400 K/uL 224 218 215    No results found for: HGBA1C Lab Results  Component Value Date   ALT 25 06/15/2016   AST 34 06/15/2016   ALKPHOS 176 (H) 06/15/2016   BILITOT 0.9 06/15/2016    Radiology:  There is no new neuroimaging for review.    A/P:   1. 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 Initial rhythm was noted to be PEA. This appears to be a very mild insult and he is doing quite well at this time. Continue supportive care.   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 He is doing well. On my exam today, he may have some slightly impaired speed of processing and is disoriented to time. No gross neuropsych impairments are seen. Elemental neuro exam is normal. Continue with supportive care. Recommend more formal assessment of his cognition by SLP to further assess for any subtle dysfunction.   3. Myoclonus: Resolved. Stop Keppra.   No family present at the time of my visit.   I have no further recommendations and will sign off. Please call if any new issues should arise.   Rhona Leavensimothy Oster, MD [facility]

## 2016-06-16 NOTE — Progress Notes (Signed)
Initial Nutrition Assessment  DOCUMENTATION CODES:   Not applicable  INTERVENTION:    Nepro Shake po BID, each supplement provides 425 kcal and 19 grams protein  NUTRITION DIAGNOSIS:   Increased nutrient needs related to chronic illness as evidenced by estimated needs  GOAL:   Patient will meet greater than or equal to 90% of their needs  MONITOR:   PO intake, Supplement acceptance, Labs, Weight trends, Skin, I & O's  REASON FOR ASSESSMENT:   Malnutrition Screening Tool  ASSESSMENT:   61 yo Male was on bus going to HD.  C/o shortness of breath and dizziness.  Developed cardiac arrest.  Did not require defibrillation.  ROSC after about 25 minutes.  He had King airway changed to ETT in ER and given etomidate and paralytic medication for this procedure.  RD unable to obtain nutrition hx; pt unintelligble. Admitted due to SOB, leading to PEA arrest with 25 min ROSC. Per Malnutrition Screening Tool Report, pt eating poorly because of a decreased appetite.   Also with recent weight loss without trying.  Amount and time frame unknown. Labs and medications reviewed.  CBG's 101-104-95.  Unable to complete Nutrition-Focused physical exam at this time.   Diet Order:  Diet renal/carb modified with fluid restriction Diet-HS Snack? Nothing; Room service appropriate? Yes; Fluid consistency: Thin  Skin:  Reviewed, no issues  Last BM:  6/1  Height:   Ht Readings from Last 1 Encounters:  06/14/16 5\' 4"  (1.626 m)   Weight:   Wt Readings from Last 1 Encounters:  06/16/16 149 lb 0.5 oz (67.6 kg)   Ideal Body Weight:  59 kg  BMI:  Body mass index is 25.58 kg/m.  Estimated Nutritional Needs:   Kcal:  2000-2200  Protein:  100-110 gm  Fluid:  1200 ml  EDUCATION NEEDS:   No education needs identified at this time  Maureen ChattersKatie Janith Nielson, RD, LDN Pager #: [phone] After-Hours Pager #: [phone]

## 2016-06-16 NOTE — Progress Notes (Signed)
Progress Note  Patient Name: Sidonie Dickensimoteo Kucharski Date of Encounter: 06/16/2016  Primary Cardiologist: Lenisha Lacap-new  Subjective   Awake this morning. No complaints other than chest wall soreness. He denies preceding anginal symptoms. Still does not quite recall what happened prior to his collapse. Had dialysis yesterday but does not recall  Inpatient Medications    Scheduled Meds: . aspirin EC  81 mg Oral Daily  . heparin subcutaneous  5,000 Units Subcutaneous Q8H  . pantoprazole (PROTONIX) IV  40 mg Intravenous Q24H  . rosuvastatin  10 mg Oral q1800   Continuous Infusions: . sodium chloride 30 mL/hr at 06/16/16 0622  . sodium chloride    . sodium chloride    . levETIRAcetam 250 mg (06/15/16 1800)   PRN Meds: sodium chloride, sodium chloride, acetaminophen, bisacodyl, hydrALAZINE, lidocaine (PF), lidocaine-prilocaine, oxyCODONE, pentafluoroprop-tetrafluoroeth   Vital Signs    Vitals:   06/16/16 0412 06/16/16 0500 06/16/16 0619 06/16/16 0700  BP: (!) 161/57 (!) 145/116 (!) 158/62 (!) 149/64  Pulse: 62 60 62 62  Resp: 11 13 11 12   Temp:      TempSrc:      SpO2: 96% 94% 94% 95%  Weight:      Height:        Intake/Output Summary (Last 24 hours) at 06/16/16 0741 Last data filed at 06/16/16 0700  Gross per 24 hour  Intake          1310.88 ml  Output             2000 ml  Net          -689.12 ml   Filed Weights   06/14/16 1545 06/15/16 1215 06/15/16 1538  Weight: 140 lb 14 oz (63.9 kg) 141 lb 15.6 oz (64.4 kg) 137 lb 9.1 oz (62.4 kg)    Telemetry    Sinus rhythm without significant ectopy. Episodes of bradycardia into the 50s. - Personally Reviewed  ECG    EKG done yesterday reveals sinus bradycardia with precordial nondiagnostic T-wave abnormality. - Personally Reviewed  Physical Exam  Lying flat, no distress. GEN:  under the circumstances he appears better than expected.   Neck: No JVD Cardiac: RRR, no murmurs, rubs, or gallops. No right ventricular heave is  appreciated.Marland Kitchen.  Respiratory: Clear to auscultation bilaterally. GI: Soft, nontender, non-distended  MS: No edema; No deformity. Neuro:  Nonfocal  Psych: Normal affect   Labs    Chemistry Recent Labs Lab 06/14/16 1543 06/14/16 2041 06/15/16 1057 06/16/16 0500  NA 137 136 139 136  K 5.3* 5.5* 5.1 4.2  CL 97* 95* 98* 94*  CO2 25 27 28 28   GLUCOSE 136* 117* 99 122*  BUN 56* 60* 68* 44*  CREATININE 7.22* 7.37* 7.89* 5.76*  CALCIUM 8.4* 8.0* 8.2* 8.0*  PROT 7.3  --  6.5  --   ALBUMIN 3.1*  --  2.7*  2.6* 2.5*  AST 94*  --  34  --   ALT 39  --  25  --   ALKPHOS 209*  --  176*  --   BILITOT 1.2  --  0.9  --   GFRNONAA 7* 7* 6* 9*  GFRAA 8* 8* 7* 11*  ANIONGAP 15 14 13 14      Hematology Recent Labs Lab 06/14/16 1543 06/15/16 1057 06/16/16 0500  WBC 11.8* 11.7* 9.2  RBC 3.92* 3.83* 3.47*  HGB 11.7* 10.5* 9.8*  HCT 34.3* 32.3* 30.1*  MCV 87.5 84.3 86.7  MCH 29.8 27.4 28.2  MCHC 34.1  32.5 32.6  RDW 16.5* 16.4* 17.0*  PLT 215 218 224    Cardiac Enzymes Recent Labs Lab 06/14/16 1543 06/14/16 2041  TROPONINI 0.08* 0.19*    Recent Labs Lab 06/14/16 1130  TROPIPOC 0.02     BNPNo results for input(s): BNP, PROBNP in the last 168 hours.   DDimer No results for input(s): DDIMER in the last 168 hours.   Radiology    Ct Head Wo Contrast  Result Date: 06/14/2016 CLINICAL DATA:  61 year old male became dizzy on way to dialysis. Cardiac arrest. Initial encounter. EXAM: CT HEAD WITHOUT CONTRAST TECHNIQUE: Contiguous axial images were obtained from the base of the skull through the vertex without intravenous contrast. COMPARISON:  None. FINDINGS: Brain: No intracranial hemorrhage. Infarct posterior left lenticular nucleus probably remote although age indeterminate. No CT evidence of large acute infarct. Global atrophy without hydrocephalus. No intracranial mass lesion noted on this unenhanced exam. Preservation gray-white differentiation without findings of diffuse anoxia  currently. Vascular: Vascular calcifications. Skull: No acute abnormality. Sinuses/Orbits: Post lens replacement. Mild exophthalmos. No acute abnormality. Mucosal thickening maxillary sinuses and ethmoid sinus air cells bilaterally. Other: Mastoid air cells and middle ear cavities are clear. Prominent dermal calcifications possibly vascular in origin. IMPRESSION: No intracranial hemorrhage. Infarct posterior left lenticular nucleus probably remote although age indeterminate. No CT evidence of large acute infarct. Global atrophy. Maxillary sinus and ethmoid sinus air cell mucosal thickening. Electronically Signed   By: Lacy Duverney M.D.   On: 06/14/2016 13:20   Ct Angio Chest Pe W Or Wo Contrast  Result Date: 06/14/2016 CLINICAL DATA:  Dyspnea. Intubated. Cardiac arrest en route to dialysis. EXAM: CT ANGIOGRAPHY CHEST WITH CONTRAST TECHNIQUE: Multidetector CT imaging of the chest was performed using the standard protocol during bolus administration of intravenous contrast. Multiplanar CT image reconstructions and MIPs were obtained to evaluate the vascular anatomy. CONTRAST:  80 cc Isovue 370 IV. COMPARISON:  Chest radiograph from earlier today. FINDINGS: Cardiovascular: The study is high quality for the evaluation of pulmonary embolism. There are no filling defects in the central, lobar, segmental or subsegmental pulmonary artery branches to suggest acute pulmonary embolism. Atherosclerotic nonaneurysmal thoracic aorta. Dilated main pulmonary artery (3.5 cm diameter). Mild cardiomegaly. No significant pericardial fluid/thickening. Left anterior descending, left circumflex and right coronary atherosclerosis. Mediastinum/Nodes: No discrete thyroid nodules. Enteric tube terminates in the proximal stomach (as seen on the scout topogram). No pathologically enlarged axillary, mediastinal or hilar lymph nodes. Subcentimeter calcified bilateral hilar, AP window and subcarinal lymph nodes are presumably from prior  granulomatous disease. Lungs/Pleura: No pneumothorax. Dependent small to moderate right and trace left pleural effusions. Endotracheal tube tip is 1.3 cm above the carina. Near complete left lower lobe atelectasis. Moderate compressive atelectasis in the dependent right lung. Segmental lingular atelectasis. No lung masses or significant pulmonary nodules in the limited aerated portions of the lungs. Upper abdomen: Small volume upper abdominal ascites. Musculoskeletal: No aggressive appearing focal osseous lesions. Nearly healed anterior right second through fourth rib fractures. Mild thoracic spondylosis. Review of the MIP images confirms the above findings. IMPRESSION: 1. No pulmonary embolism. 2. Endotracheal tube tip 1.3 cm above the carina. Enteric tube terminates in the proximal stomach . 3. Mild cardiomegaly. Small to moderate right and trace left dependent pleural effusions. Small volume upper abdominal ascites. 4. Dilated main pulmonary artery, suggesting pulmonary arterial hypertension . 5. Near complete left lower lobe atelectasis. Segmental lingular atelectasis. Moderate compressive atelectasis in the dependent right lung. 6. Aortic atherosclerosis.  Three-vessel coronary atherosclerosis.  Electronically Signed   By: Delbert Phenix M.D.   On: 06/14/2016 13:32   Dg Chest Port 1 View  Result Date: 06/15/2016 CLINICAL DATA:  Respiratory failure EXAM: PORTABLE CHEST 1 VIEW COMPARISON:  06/14/2016 FINDINGS: Endotracheal tube and NG tube remain in place, unchanged. Cardiomegaly with vascular congestion. No confluent opacities or overt failure. IMPRESSION: Cardiomegaly, vascular congestion.  Improving aeration in the bases. Electronically Signed   By: Charlett Nose M.D.   On: 06/15/2016 07:36   Dg Chest Port 1 View  Result Date: 06/14/2016 CLINICAL DATA:  Cardiac arrest. EXAM: PORTABLE CHEST 1 VIEW 5:56 p.m. COMPARISON:  06/14/2016 at 11:40 a.m. and chest CT dated 06/14/2016 FINDINGS: Heart size and pulmonary  vascularity are normal. Pulmonary edema present on the prior study has resolved. Density at both lung bases consistent with previously demonstrated bilateral pleural effusions. Endotracheal tube and NG tube appear in good position. IMPRESSION: 1. Resolution of pulmonary edema. 2. Persistent density at the lung bases consistent with previously demonstrated effusions. Electronically Signed   By: Francene Boyers M.D.   On: 06/14/2016 18:08   Dg Chest Portable 1 View  Result Date: 06/14/2016 CLINICAL DATA:  Endotracheal tube placement EXAM: PORTABLE CHEST 1 VIEW COMPARISON:  None. FINDINGS: Endotracheal tube with tip 2.4 cm above the carina. An orogastric tube reaches the stomach at least. Cardiomegaly. Retrocardiac opacity with volume loss. Asymmetric interstitial opacity on the right. Suspect layering pleural effusions. IMPRESSION: 1. Endotracheal tube tip 2.4 cm above the carina. The orogastric tube at least reaches the stomach. 2. Retrocardiac opacity with volume loss favoring atelectasis. 3. Suspect layering pleural effusions. Interstitial coarsening on the right which could be atelectasis, asymmetric edema, or infection. Electronically Signed   By: Marnee Spring M.D.   On: 06/14/2016 11:57    Cardiac Studies   Echocardiogram 06/14/16: Impressions:  - LVEF 40-45%, mild diffuse hypokinesis, moderate concentric LVH. RV is moderately dilated with moderately decreased RV systolic function. Severe pulmonary hypertension, RVSP 87 mmHg. Severe biatrial dilatation.   Patient Profile     61 y.o. male with PMH of ESRD on HD, Pulmonary HTN, GERD, HTN, anemia, CVA and Tobacco/ETOH use who presented after a cardiac arrest. No prior history of cardiac disease, recent low risk myocardial perfusion study and prior documentation of normal EF (late 2017). Recent documentation of severe pulmonary hypertension by echo and right heart catheterization in late 2017.Now extubated, awake, and with no apparent  significant neurological impairment.  Assessment & Plan    1. PEA/Brady-asystolic cardiac arrest possibly related to severe pulmonary hypertension. Alternative explanation is sinus node dysfunction with symptomatic severe bradycardia. He has baseline bradycardia on no medications to slow heart rate 2. Chronic combined systolic and diastolic heart failure, stable after dialysis (although I don't see documentation that dialysis was actually done) without evidence of volume overload. EF 40-45%. Further dialysis per direction of nephrology 3. Sinus bradycardia. Check TSH 4. End-stage kidney disease, on dialysis 5. Prior CVA. and acute anoxic brain encephalopathy 6. Respiratory failure has resolved. 7. Severe pulmonary hypertension, etiology is unclear. No PE by CT scan. Acute on chronic right heart failure/cor pulmonale. I have recommended right and left heart catheterization be performed tomorrow to fully define the presence or absence of coronary disease and formally document pulmonary pressures. This will help to clarify management needs from a cardiovascular standpoint.  The patient was counseled to undergo left and right heart catheterization, coronary angiography, and possible percutaneous coronary intervention with stent implantation. The procedural risks and benefits were  discussed in detail. The risks discussed included death, stroke, myocardial infarction, life-threatening bleeding, limb ischemia, kidney injury, allergy, and possible emergency cardiac surgery. The risk of these significant complications were estimated to occur less than 1% of the time. After discussion, the patient has agreed to proceed. We will steal need to speak with family as the patient has post arrest anoxic and cephalopathy    Signed, [name], MD  06/16/2016, 7:41 AM

## 2016-06-16 NOTE — Progress Notes (Signed)
PCCM Progress Note  Admission date: 06/14/2016 Referring provider: Dr. Lynelle Doctor, ER  CC: Short of breath.  HPI: 61 yo with dyspnea, dizziness leading to PEA cardiac arrest with 25 minutes for ROSC.  Hx of ESRD, pulmonary HTN.  Subjective: Mild chest soreness from CPR.  Vital signs: BP (!) 149/75   Pulse 62   Temp 98.5 F (36.9 C) (Oral)   Resp 12   Ht 5\' 4"  (1.626 m)   Wt 137 lb 9.1 oz (62.4 kg)   SpO2 96%   BMI 23.61 kg/m   Intake/output: I/O last 3 completed shifts: In: 2087.6 [P.O.:360; I.V.:1425.1; NG/GT:200; IV Piggyback:102.5] Out: 2350 [Emesis/NG output:350; Other:2000]  General - pleasant Eyes - pupils reactive ENT - no sinus tenderness, no oral exudate, no LAN Cardiac - regular, no murmur Chest - no wheeze, rales Abd - soft, non tender Ext - no edema Skin - no rashes Neuro - normal strength Psych - normal mood    CMP Latest Ref Rng & Units 06/16/2016 06/15/2016 06/14/2016  Glucose 65 - 99 mg/dL 696(E) 99 952(W)  BUN 6 - 20 mg/dL 41(L) 24(M) 01(U)  Creatinine 0.61 - 1.24 mg/dL 2.72(Z) 3.66(Y) 4.03(K)  Sodium 135 - 145 mmol/L 136 139 136  Potassium 3.5 - 5.1 mmol/L 4.2 5.1 5.5(H)  Chloride 101 - 111 mmol/L 94(L) 98(L) 95(L)  CO2 22 - 32 mmol/L 28 28 27   Calcium 8.9 - 10.3 mg/dL 8.0(L) 8.2(L) 8.0(L)  Total Protein 6.5 - 8.1 g/dL - 6.5 -  Total Bilirubin 0.3 - 1.2 mg/dL - 0.9 -  Alkaline Phos 38 - 126 U/L - 176(H) -  AST 15 - 41 U/L - 34 -  ALT 17 - 63 U/L - 25 -     CBC Latest Ref Rng & Units 06/16/2016 06/15/2016 06/14/2016  WBC 4.0 - 10.5 K/uL 9.2 11.7(H) 11.8(H)  Hemoglobin 13.0 - 17.0 g/dL 7.4(Q) 10.5(L) 11.7(L)  Hematocrit 39.0 - 52.0 % 30.1(L) 32.3(L) 34.3(L)  Platelets 150 - 400 K/uL 224 218 215     ABG    Component Value Date/Time   PHART 7.430 06/14/2016 1751   PCO2ART 41.0 06/14/2016 1751   PO2ART 133.0 (H) 06/14/2016 1751   HCO3 28.1 (H) 06/14/2016 1751   TCO2 29 06/14/2016 1751   O2SAT 99.0 06/14/2016 1751     CBG (last 3)   Recent  Labs  06/15/16 0323 06/15/16 0812 06/15/16 1144  GLUCAP 101* 104* 95     Imaging: Ct Head Wo Contrast  Result Date: 06/14/2016 CLINICAL DATA:  61 year old male became dizzy on way to dialysis. Cardiac arrest. Initial encounter. EXAM: CT HEAD WITHOUT CONTRAST TECHNIQUE: Contiguous axial images were obtained from the base of the skull through the vertex without intravenous contrast. COMPARISON:  None. FINDINGS: Brain: No intracranial hemorrhage. Infarct posterior left lenticular nucleus probably remote although age indeterminate. No CT evidence of large acute infarct. Global atrophy without hydrocephalus. No intracranial mass lesion noted on this unenhanced exam. Preservation gray-white differentiation without findings of diffuse anoxia currently. Vascular: Vascular calcifications. Skull: No acute abnormality. Sinuses/Orbits: Post lens replacement. Mild exophthalmos. No acute abnormality. Mucosal thickening maxillary sinuses and ethmoid sinus air cells bilaterally. Other: Mastoid air cells and middle ear cavities are clear. Prominent dermal calcifications possibly vascular in origin. IMPRESSION: No intracranial hemorrhage. Infarct posterior left lenticular nucleus probably remote although age indeterminate. No CT evidence of large acute infarct. Global atrophy. Maxillary sinus and ethmoid sinus air cell mucosal thickening. Electronically Signed   By: Ernie Hew.D.  On: 06/14/2016 13:20   Ct Angio Chest Pe W Or Wo Contrast  Result Date: 06/14/2016 CLINICAL DATA:  Dyspnea. Intubated. Cardiac arrest en route to dialysis. EXAM: CT ANGIOGRAPHY CHEST WITH CONTRAST TECHNIQUE: Multidetector CT imaging of the chest was performed using the standard protocol during bolus administration of intravenous contrast. Multiplanar CT image reconstructions and MIPs were obtained to evaluate the vascular anatomy. CONTRAST:  80 cc Isovue 370 IV. COMPARISON:  Chest radiograph from earlier today. FINDINGS: Cardiovascular:  The study is high quality for the evaluation of pulmonary embolism. There are no filling defects in the central, lobar, segmental or subsegmental pulmonary artery branches to suggest acute pulmonary embolism. Atherosclerotic nonaneurysmal thoracic aorta. Dilated main pulmonary artery (3.5 cm diameter). Mild cardiomegaly. No significant pericardial fluid/thickening. Left anterior descending, left circumflex and right coronary atherosclerosis. Mediastinum/Nodes: No discrete thyroid nodules. Enteric tube terminates in the proximal stomach (as seen on the scout topogram). No pathologically enlarged axillary, mediastinal or hilar lymph nodes. Subcentimeter calcified bilateral hilar, AP window and subcarinal lymph nodes are presumably from prior granulomatous disease. Lungs/Pleura: No pneumothorax. Dependent small to moderate right and trace left pleural effusions. Endotracheal tube tip is 1.3 cm above the carina. Near complete left lower lobe atelectasis. Moderate compressive atelectasis in the dependent right lung. Segmental lingular atelectasis. No lung masses or significant pulmonary nodules in the limited aerated portions of the lungs. Upper abdomen: Small volume upper abdominal ascites. Musculoskeletal: No aggressive appearing focal osseous lesions. Nearly healed anterior right second through fourth rib fractures. Mild thoracic spondylosis. Review of the MIP images confirms the above findings. IMPRESSION: 1. No pulmonary embolism. 2. Endotracheal tube tip 1.3 cm above the carina. Enteric tube terminates in the proximal stomach . 3. Mild cardiomegaly. Small to moderate right and trace left dependent pleural effusions. Small volume upper abdominal ascites. 4. Dilated main pulmonary artery, suggesting pulmonary arterial hypertension . 5. Near complete left lower lobe atelectasis. Segmental lingular atelectasis. Moderate compressive atelectasis in the dependent right lung. 6. Aortic atherosclerosis.  Three-vessel  coronary atherosclerosis. Electronically Signed   By: Delbert PhenixJason A Poff M.D.   On: 06/14/2016 13:32   Dg Chest Port 1 View  Result Date: 06/15/2016 CLINICAL DATA:  Respiratory failure EXAM: PORTABLE CHEST 1 VIEW COMPARISON:  06/14/2016 FINDINGS: Endotracheal tube and NG tube remain in place, unchanged. Cardiomegaly with vascular congestion. No confluent opacities or overt failure. IMPRESSION: Cardiomegaly, vascular congestion.  Improving aeration in the bases. Electronically Signed   By: Charlett NoseKevin  Dover M.D.   On: 06/15/2016 07:36   Dg Chest Port 1 View  Result Date: 06/14/2016 CLINICAL DATA:  Cardiac arrest. EXAM: PORTABLE CHEST 1 VIEW 5:56 p.m. COMPARISON:  06/14/2016 at 11:40 a.m. and chest CT dated 06/14/2016 FINDINGS: Heart size and pulmonary vascularity are normal. Pulmonary edema present on the prior study has resolved. Density at both lung bases consistent with previously demonstrated bilateral pleural effusions. Endotracheal tube and NG tube appear in good position. IMPRESSION: 1. Resolution of pulmonary edema. 2. Persistent density at the lung bases consistent with previously demonstrated effusions. Electronically Signed   By: Francene BoyersJames  Maxwell M.D.   On: 06/14/2016 18:08   Dg Chest Portable 1 View  Result Date: 06/14/2016 CLINICAL DATA:  Endotracheal tube placement EXAM: PORTABLE CHEST 1 VIEW COMPARISON:  None. FINDINGS: Endotracheal tube with tip 2.4 cm above the carina. An orogastric tube reaches the stomach at least. Cardiomegaly. Retrocardiac opacity with volume loss. Asymmetric interstitial opacity on the right. Suspect layering pleural effusions. IMPRESSION: 1. Endotracheal tube tip 2.4 cm  above the carina. The orogastric tube at least reaches the stomach. 2. Retrocardiac opacity with volume loss favoring atelectasis. 3. Suspect layering pleural effusions. Interstitial coarsening on the right which could be atelectasis, asymmetric edema, or infection. Electronically Signed   By: Marnee Spring M.D.    On: 06/14/2016 11:57     Studies: CT head 6/04 >> old Lt posterior lenticular nucleus CVA, atrophy CT angio chest 6/04 >> mod Rt and trace Lt pleural effusion, CM, ascites, dilated PA, ATX LLL, atherosclerosis EEG 6/04 >> diffuse suppression and slowing Echo 6/04 >> EF 40 to 45%, grade 1 DD, mild MR, severe LA dilation, mod RV dilation, mod TR, PAS 87 mmHg  Lines/tubes: ETT 6/04 >> 6/05  Events: 6/04 Admit, cardiology and renal consulted; myoclonus >> neuro consulte 6/05 Awake, following commands, extubated, keppra d/ced and neuro s/o  Summary: 62 yo with PEA cardiac arrest with 25 minutes for ROSC.  Mostly likely related to acute hypoxia in setting of volume overload with hx of ESRD and pulmonary HTN.  Assessment/plan:  Acute respiratory failure in setting of PEA cardiac arrest. - resolved  Possible sleep apnea. - will need sleep study as outpt  PEA cardiac arrest. Hx of HTN, pulmonary HTN, CVA. Atherosclerosis noted on CT chest. - for LHC/RHC 6/06  ESRD on HD M,W, F. - HD per renal  Acute anoxic encephalopathy with transient myoclonus 6/04. - resolved  Anemia of chronic disease. - f/u CBC  DVT prophylaxis - SQ heparin SUP - protonix Nutrition - renal diet Goals of care - full code  Keeping in ICU pending LHC/RHC.  Otherwise can go to tele.  D/c planning dependent on cardiology plan.  Coralyn Helling, MD [facility] Pulmonary/Critical Care 06/16/2016, 8:46 AM Pager:  [phone] After 3pm call: [phone]

## 2016-06-17 ENCOUNTER — Encounter (HOSPITAL_COMMUNITY)
Admission: EM | Disposition: A | Discharge status: Home / Self Care | Payer: Self-pay | Source: Home / Self Care | Attending: Pulmonary Disease

## 2016-06-17 DIAGNOSIS — I2510 Atherosclerotic heart disease of native coronary artery without angina pectoris: Secondary | ICD-10-CM | POA: Diagnosis present

## 2016-06-17 HISTORY — PX: RIGHT/LEFT HEART CATH AND CORONARY ANGIOGRAPHY: CATH118266

## 2016-06-17 HISTORY — PX: INTRAVASCULAR PRESSURE WIRE/FFR STUDY: CATH118243

## 2016-06-17 LAB — POCT ACTIVATED CLOTTING TIME
ACTIVATED CLOTTING TIME: 202 s
Activated Clotting Time: 417 seconds
Activated Clotting Time: 175 seconds
Activated Clotting Time: 180 seconds
Activated Clotting Time: 153 seconds

## 2016-06-17 LAB — BASIC METABOLIC PANEL
ANION GAP: 14 (ref 5–15)
BUN: 53 mg/dL — ABNORMAL HIGH (ref 6–20)
CALCIUM: 8.2 mg/dL — AB (ref 8.9–10.3)
CO2: 26 mmol/L (ref 22–32)
Chloride: 94 mmol/L — ABNORMAL LOW (ref 101–111)
Creatinine, Ser: 6.92 mg/dL — ABNORMAL HIGH (ref 0.61–1.24)
GFR, EST AFRICAN AMERICAN: 9 mL/min — AB (ref >60)
GFR, EST NON AFRICAN AMERICAN: 8 mL/min — AB (ref >60)
Glucose, Bld: 115 mg/dL — ABNORMAL HIGH (ref 65–99)
POTASSIUM: 4.1 mmol/L (ref 3.5–5.1)
SODIUM: 134 mmol/L — AB (ref 135–145)

## 2016-06-17 LAB — POCT I-STAT 3, VENOUS BLOOD GAS (G3P V)
ACID-BASE EXCESS: 8 mmol/L — AB (ref 0.0–2.0)
Bicarbonate: 31.4 mmol/L — ABNORMAL HIGH (ref 20.0–28.0)
O2 SAT: 75 %
TCO2: 33 mmol/L (ref 0–100)
pCO2, Ven: 40.4 mmHg — ABNORMAL LOW (ref 44.0–60.0)
pH, Ven: 7.499 — ABNORMAL HIGH (ref 7.250–7.430)
pO2, Ven: 36 mmHg (ref 32.0–45.0)

## 2016-06-17 LAB — CBC
HCT: 28.8 % — ABNORMAL LOW (ref 39.0–52.0)
HEMOGLOBIN: 9.2 g/dL — AB (ref 13.0–17.0)
MCH: 26.5 pg (ref 26.0–34.0)
MCHC: 31.9 g/dL (ref 30.0–36.0)
MCV: 83 fL (ref 78.0–100.0)
Platelets: 194 10*3/uL (ref 150–400)
RBC: 3.47 MIL/uL — AB (ref 4.22–5.81)
RDW: 16.4 % — ABNORMAL HIGH (ref 11.5–15.5)
WBC: 10.2 10*3/uL (ref 4.0–10.5)

## 2016-06-17 LAB — POCT I-STAT 3, ART BLOOD GAS (G3+)
ACID-BASE EXCESS: 7 mmol/L — AB (ref 0.0–2.0)
BICARBONATE: 30.1 mmol/L — AB (ref 20.0–28.0)
O2 Saturation: 99 %
TCO2: 31 mmol/L (ref 0–100)
pCO2 arterial: 36.9 mmHg (ref 32.0–48.0)
pH, Arterial: 7.519 — ABNORMAL HIGH (ref 7.350–7.450)
pO2, Arterial: 115 mmHg — ABNORMAL HIGH (ref 83.0–108.0)

## 2016-06-17 SURGERY — RIGHT/LEFT HEART CATH AND CORONARY ANGIOGRAPHY
Anesthesia: LOCAL

## 2016-06-17 MED ORDER — LIDOCAINE HCL (PF) 1 % IJ SOLN
INTRAMUSCULAR | Status: AC
  Filled 2016-06-17: qty 30

## 2016-06-17 MED ORDER — FENTANYL CITRATE (PF) 100 MCG/2ML IJ SOLN
INTRAMUSCULAR | Status: AC
  Filled 2016-06-17: qty 2

## 2016-06-17 MED ORDER — MIDAZOLAM HCL 2 MG/2ML IJ SOLN
INTRAMUSCULAR | Status: AC
  Filled 2016-06-17: qty 2

## 2016-06-17 MED ORDER — SODIUM CHLORIDE 0.9 % IV SOLN: 250.0000 mL | INTRAVENOUS | Status: DC | PRN

## 2016-06-17 MED ORDER — FENTANYL CITRATE (PF) 100 MCG/2ML IJ SOLN
INTRAMUSCULAR | Status: DC | PRN
  Administered 2016-06-17 (×2): 25 ug via INTRAVENOUS

## 2016-06-17 MED ORDER — SODIUM CHLORIDE 0.9% FLUSH: 3.0000 mL | INTRAVENOUS | Status: DC | PRN

## 2016-06-17 MED ORDER — ASPIRIN 81 MG PO CHEW
CHEWABLE_TABLET | ORAL | Status: DC | PRN
  Administered 2016-06-17: 324 mg via ORAL

## 2016-06-17 MED ORDER — HEPARIN (PORCINE) IN NACL 100-0.45 UNIT/ML-% IJ SOLN
750.0000 [IU]/h | INTRAMUSCULAR | Status: DC
  Administered 2016-06-18: 750 [IU]/h via INTRAVENOUS
  Filled 2016-06-17: qty 250

## 2016-06-17 MED ORDER — ADENOSINE (DIAGNOSTIC) 140MCG/KG/MIN
INTRAVENOUS | Status: DC | PRN
  Administered 2016-06-17: 140 ug/kg/min via INTRAVENOUS

## 2016-06-17 MED ORDER — SODIUM CHLORIDE 0.9% FLUSH
3.0000 mL | Freq: Two times a day (BID) | INTRAVENOUS | Status: DC
  Administered 2016-06-17 – 2016-06-19 (×4): 3 mL via INTRAVENOUS

## 2016-06-17 MED ORDER — HEPARIN (PORCINE) IN NACL 2-0.9 UNIT/ML-% IJ SOLN
INTRAMUSCULAR | Status: AC | PRN
  Administered 2016-06-17: 1500 mL

## 2016-06-17 MED ORDER — ASPIRIN 325 MG PO TABS
ORAL_TABLET | ORAL | Status: AC
  Filled 2016-06-17: qty 1

## 2016-06-17 MED ORDER — HEPARIN SODIUM (PORCINE) 1000 UNIT/ML IJ SOLN
INTRAMUSCULAR | Status: AC
  Filled 2016-06-17: qty 1

## 2016-06-17 MED ORDER — HEPARIN SODIUM (PORCINE) 1000 UNIT/ML IJ SOLN
INTRAMUSCULAR | Status: DC | PRN
  Administered 2016-06-17: 8000 [IU] via INTRAVENOUS

## 2016-06-17 MED ORDER — ADENOSINE 12 MG/4ML IV SOLN
INTRAVENOUS | Status: AC
  Filled 2016-06-17: qty 16

## 2016-06-17 MED ORDER — IOPAMIDOL (ISOVUE-370) INJECTION 76%
INTRAVENOUS | Status: DC | PRN
  Administered 2016-06-17: 125 mL via INTRA_ARTERIAL

## 2016-06-17 MED ORDER — HEPARIN (PORCINE) IN NACL 100-0.45 UNIT/ML-% IJ SOLN: 750.0000 [IU]/h | INTRAMUSCULAR | Status: DC

## 2016-06-17 MED ORDER — MIDAZOLAM HCL 2 MG/2ML IJ SOLN
INTRAMUSCULAR | Status: DC | PRN
  Administered 2016-06-17 (×2): 1 mg via INTRAVENOUS

## 2016-06-17 MED ORDER — IOPAMIDOL (ISOVUE-370) INJECTION 76%
INTRAVENOUS | Status: AC
  Filled 2016-06-17: qty 100

## 2016-06-17 MED ORDER — OXYCODONE HCL 5 MG PO TABS
ORAL_TABLET | ORAL | Status: AC
  Filled 2016-06-17: qty 1

## 2016-06-17 MED ORDER — HYDRALAZINE HCL 20 MG/ML IJ SOLN
5.0000 mg | INTRAMUSCULAR | Status: AC | PRN
  Administered 2016-06-17 (×4): 5 mg via INTRAVENOUS
  Filled 2016-06-17: qty 1

## 2016-06-17 MED ORDER — HEPARIN (PORCINE) IN NACL 2-0.9 UNIT/ML-% IJ SOLN
INTRAMUSCULAR | Status: AC
  Filled 2016-06-17: qty 1000

## 2016-06-17 MED ORDER — HEPARIN (PORCINE) IN NACL 2-0.9 UNIT/ML-% IJ SOLN
INTRAMUSCULAR | Status: AC
  Filled 2016-06-17: qty 500

## 2016-06-17 MED ORDER — LABETALOL HCL 5 MG/ML IV SOLN
10.0000 mg | INTRAVENOUS | Status: AC | PRN
  Administered 2016-06-17 (×4): 10 mg via INTRAVENOUS
  Filled 2016-06-17 (×2): qty 4

## 2016-06-17 MED ORDER — LIDOCAINE HCL (PF) 1 % IJ SOLN
INTRAMUSCULAR | Status: DC | PRN
  Administered 2016-06-17: 15 mL

## 2016-06-17 SURGICAL SUPPLY — 16 items
CATH EXPO 5F MPA-1 (CATHETERS) ×2 IMPLANT
CATH INFINITI 5FR MULTPACK ANG (CATHETERS) ×2 IMPLANT
CATH SWAN GANZ 7F STRAIGHT (CATHETERS) ×2 IMPLANT
CATH VISTA GUIDE 6FR XBLAD3.0 (CATHETERS) ×2 IMPLANT
DEVICE CONTINUOUS FLUSH (MISCELLANEOUS) ×2 IMPLANT
GUIDEWIRE PRESSURE COMET II (WIRE) ×2 IMPLANT
KIT ESSENTIALS PG (KITS) ×2 IMPLANT
KIT HEART LEFT (KITS) ×2 IMPLANT
PACK CARDIAC CATHETERIZATION (CUSTOM PROCEDURE TRAY) ×2 IMPLANT
SHEATH PINNACLE 5F 10CM (SHEATH) ×2 IMPLANT
SHEATH PINNACLE 6F 10CM (SHEATH) ×2 IMPLANT
SHEATH PINNACLE 7F 10CM (SHEATH) ×2 IMPLANT
TRANSDUCER W/STOPCOCK (MISCELLANEOUS) ×2 IMPLANT
TUBING CIL FLEX 10 FLL-RA (TUBING) ×2 IMPLANT
WIRE EMERALD 3MM-J .025X260CM (WIRE) ×2 IMPLANT
WIRE EMERALD 3MM-J .035X150CM (WIRE) ×2 IMPLANT

## 2016-06-17 NOTE — Progress Notes (Signed)
Admit: 06/14/2016 LOS: 3  65M ESRD with cardiac arrest 6/4  Subjective:  No new events LHC/RHC postponed to today  06/06 0701 - 06/07 0700 In: 1510 [P.O.:780; I.V.:730] Out: 1 [Stool:1]  Filed Weights   06/15/16 1538 06/16/16 1032 06/17/16 0500  Weight: 62.4 kg (137 lb 9.1 oz) 67.6 kg (149 lb 0.5 oz) 66.2 kg (145 lb 15.1 oz)    Scheduled Meds: . aspirin EC  81 mg Oral Daily  . feeding supplement (NEPRO CARB STEADY)  237 mL Oral BID BM  . heparin subcutaneous  5,000 Units Subcutaneous Q8H  . pantoprazole (PROTONIX) IV  40 mg Intravenous Q24H  . rosuvastatin  10 mg Oral q1800  . sodium chloride flush  3 mL Intravenous Q12H   Continuous Infusions: . sodium chloride 30 mL/hr at 06/17/16 0700  . sodium chloride    . sodium chloride    . sodium chloride    . sodium chloride     PRN Meds:.sodium chloride, sodium chloride, sodium chloride, acetaminophen, bisacodyl, hydrALAZINE, lidocaine (PF), lidocaine-prilocaine, oxyCODONE, pentafluoroprop-tetrafluoroeth, sodium chloride flush  Current Labs: reviewed    Physical Exam:  Blood pressure (!) 170/74, pulse 71, temperature 99.7 F (37.6 C), temperature source Oral, resp. rate 15, height 5\' 4"  (1.626 m), weight 66.2 kg (145 lb 15.1 oz), SpO2 94 %. awake, alert RRR Coarse bs b/o No LEE +B/T of AVF S/ntn/d  Dialysis Orders: Center: [facility]  on MWF . EDW 60.0 kg HD Bath 2k, 2.0Ca   Time 4hr Heparin None . Access LUA AVF     Calcitriol 0.75 mcg IV/HD , Mircera 75mcg q 4wks  (last given 05/28/16)   Other  OP labs = hgb 11.1 06/09/16  pth 314   Ca 9.3  Phos  2.9   A 1. ESRD MWF Halfway LUE AVF 2. PEA Cardiac Arrest 06/14/16 3. Hyperkalemia, resolved 4. HTN, improved this AM 5. Anemia, stable 6. CKD BMD, Ca and P at goal 7. Pulm HTN  P 1. HD today; will need to get back onto schedule; pull to EDW not to exceed 3.5L UF, 2K, AVF. No heparin.   Sabra Heckyan Natacia Chaisson MD 06/17/2016, 7:58 AM   Recent Labs Lab 06/15/16 1057 06/16/16 0500  06/17/16 0407  NA 139 136 134*  K 5.1 4.2 4.1  CL 98* 94* 94*  CO2 28 28 26   GLUCOSE 99 122* 115*  BUN 68* 44* 53*  CREATININE 7.89* 5.76* 6.92*  CALCIUM 8.2* 8.0* 8.2*  PHOS 4.0 3.3  --     Recent Labs Lab 06/14/16 1543 06/15/16 1057 06/16/16 0500  WBC 11.8* 11.7* 9.2  NEUTROABS 10.2*  --   --   HGB 11.7* 10.5* 9.8*  HCT 34.3* 32.3* 30.1*  MCV 87.5 84.3 86.7  PLT 215 218 224            d

## 2016-06-17 NOTE — H&P (View-Only) (Signed)
Progress Note  Patient Name: Jonathan Terry Date of Encounter: 06/17/2016  Primary Cardiologist: [facility]-H. Lachelle Rissler  Subjective   No chest pain or dyspnea. Quiet night.  Inpatient Medications    Scheduled Meds: . aspirin EC  81 mg Oral Daily  . feeding supplement (NEPRO CARB STEADY)  237 mL Oral BID BM  . heparin subcutaneous  5,000 Units Subcutaneous Q8H  . pantoprazole (PROTONIX) IV  40 mg Intravenous Q24H  . rosuvastatin  10 mg Oral q1800  . sodium chloride flush  3 mL Intravenous Q12H   Continuous Infusions: . sodium chloride 30 mL/hr at 06/17/16 0700  . sodium chloride    . sodium chloride    . sodium chloride    . sodium chloride     PRN Meds: sodium chloride, sodium chloride, sodium chloride, acetaminophen, bisacodyl, hydrALAZINE, lidocaine (PF), lidocaine-prilocaine, oxyCODONE, pentafluoroprop-tetrafluoroeth, sodium chloride flush   Vital Signs    Vitals:   06/17/16 0835 06/17/16 0840 06/17/16 0845 06/17/16 0900  BP: (!) 177/84 (!) 179/87 (!) 194/90 (!) 183/84  Pulse: 73 72 72 73  Resp:  12    Temp:      TempSrc:      SpO2:      Weight:      Height:        Intake/Output Summary (Last 24 hours) at 06/17/16 0911 Last data filed at 06/17/16 0700  Gross per 24 hour  Intake             1330 ml  Output                1 ml  Net             1329 ml   Filed Weights   06/16/16 1032 06/17/16 0500 06/17/16 0830  Weight: 149 lb 0.5 oz (67.6 kg) 145 lb 15.1 oz (66.2 kg) 143 lb 1.3 oz (64.9 kg)    Telemetry    Normal sinus rhythm and sinus bradycardia - Personally Reviewed  ECG    No new tracing - Personally Reviewed  Physical Exam  Resting and calm. GEN: No acute distress.   Neck: No JVD Cardiac: RRR, no rubs. An S4 gallop is heard. Trivial 2/6 systolic murmur at left lower sternal border. Respiratory: Clear to auscultation bilaterally. GI: Soft, nontender, non-distended  MS: No edema; No deformity. Femoral pulses are 2+ and symmetric bilaterally as are  pedal pulses. Neuro:  Nonfocal  Psych: Normal affect   Labs    Chemistry Recent Labs Lab 06/14/16 1543  06/15/16 1057 06/16/16 0500 06/17/16 0407  NA 137  < > 139 136 134*  K 5.3*  < > 5.1 4.2 4.1  CL 97*  < > 98* 94* 94*  CO2 25  < > 28 28 26   GLUCOSE 136*  < > 99 122* 115*  BUN 56*  < > 68* 44* 53*  CREATININE 7.22*  < > 7.89* 5.76* 6.92*  CALCIUM 8.4*  < > 8.2* 8.0* 8.2*  PROT 7.3  --  6.5  --   --   ALBUMIN 3.1*  --  2.7*  2.6* 2.5*  --   AST 94*  --  34  --   --   ALT 39  --  25  --   --   ALKPHOS 209*  --  176*  --   --   BILITOT 1.2  --  0.9  --   --   GFRNONAA 7*  < > 6* 9* 8*  GFRAA 8*  < > 7* 11* 9*  ANIONGAP 15  < > 13 14 14   < > = values in this interval not displayed.   Hematology Recent Labs Lab 06/15/16 1057 06/16/16 0500 06/17/16 0830  WBC 11.7* 9.2 10.2  RBC 3.83* 3.47* 3.47*  HGB 10.5* 9.8* 9.2*  HCT 32.3* 30.1* 28.8*  MCV 84.3 86.7 83.0  MCH 27.4 28.2 26.5  MCHC 32.5 32.6 31.9  RDW 16.4* 17.0* 16.4*  PLT 218 224 194    Cardiac Enzymes Recent Labs Lab 06/14/16 1543 06/14/16 2041  TROPONINI 0.08* 0.19*    Recent Labs Lab 06/14/16 1130  TROPIPOC 0.02     BNPNo results for input(s): BNP, PROBNP in the last 168 hours.   DDimer No results for input(s): DDIMER in the last 168 hours.   Radiology    No results found.  Cardiac Studies   Echocardiogram documented RV systolic pressure of 87 mmHg this admission. LVEF 40-45% with moderate LVH.  Patient Profile     61 y.o. male with PMH of ESRD on HD, Pulmonary HTN, GERD, HTN, anemia, CVA and Tobacco/ETOH use who presented after a cardiac arrest. No prior history of coronary disease, recent low risk myocardial perfusion study and prior documentation of normal EF (late 2017). Recent documentation of severe pulmonary hypertension by echo and right heart catheterization in late 2017, magnitude uncertain.  Assessment & Plan    1. PEA/bradycardia asystolic cardiac arrest, Uncertain  etiology. Possibly related to severe pulmonary hypertension.  2. ?Chronic combined systolic and diastolic heart failure . Previous relatively recent documentation of normal left ventricular systolic function. Acute decline could be related to CPR. 3. End stage kidney disease on dialysis,  getting dialysis this morning. 4. Severe pulmonary hypertension,  pulmonary pressure will be documented by cath today. Will need evaluation for COPD/chronic lung disease. May need to have a sleep study to rule out unrecognized OSA. 5. 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  Plan left and right heart cath today with coronary angiography. If no critical coronary disease, will be able to transfer to telemetry. If severe pulmonary hypertension documented, will need to get expertise in managing.  Signed, [name], MD  06/17/2016, 9:11 AM

## 2016-06-17 NOTE — Progress Notes (Addendum)
ANTICOAGULATION CONSULT NOTE - Initial Consult  Pharmacy Consult for heparin Indication: chest pain/ACS  No Known Allergies  Patient Measurements: Height: 5\' 4"  (162.6 cm) Weight: 136 lb 7.4 oz (61.9 kg) IBW/kg (Calculated) : 59.2 Heparin Dosing Weight: 62 kg  Vital Signs: Temp: 98.7 F (37.1 C) (06/07 1730) Temp Source: Oral (06/07 1730) BP: 163/68 (06/07 1730) Pulse Rate: 70 (06/07 1730)  Labs:  Recent Labs  06/14/16 2041  06/15/16 1057 06/16/16 0500 06/16/16 0912 06/17/16 0407 06/17/16 0830  HGB  --   < > 10.5* 9.8*  --   --  9.2*  HCT  --   --  32.3* 30.1*  --   --  28.8*  PLT  --   --  218 224  --   --  194  LABPROT  --   --   --   --  15.3*  --   --   INR  --   --   --   --  1.21  --   --   CREATININE 7.37*  --  7.89* 5.76*  --  6.92*  --   TROPONINI 0.19*  --   --   --   --   --   --   < > = values in this interval not displayed.  Estimated Creatinine Clearance: 9.1 mL/min (A) (by C-G formula based on SCr of 6.92 mg/dL (H)).   Medical History: Past Medical History:  Diagnosis Date  . Anemia   . CVA (cerebral vascular accident) (HCC)   . ESRD (end stage renal disease) on dialysis (HCC)   . GERD (gastroesophageal reflux disease)   . Hypertension   . Tobacco use     Assessment: [age] YOM with ESRD, s/p cath today, with triple vessel CAD, will likely need repeat cath for further intervention. Pharmacy is consulted to start heparin 8 hrs after sheath removal (~ 1630). Pt was not on chronic anticoagulation, hgb 9.2, pltc 194K.  Goal of Therapy:  Heparin level 0.3-0.7 units/ml Monitor platelets by anticoagulation protocol: Yes   Plan:  Heparin infusion with no bolus 750 units/hr at 0030 F/u 8 hr heparin level at 0830 Daily heparin level and CBC F/u plans for PCI  Bayard HuggerMei Anquanette Bahner, PharmD, BCPS  Clinical Pharmacist  Pager: [phone]   06/17/2016,5:44 PM   Addendum: Per RN, updated sheath removal time is ~ 1900 Will retime heparin start time to 0230 F/u  heparin level at 1030  Bayard HuggerMei Alvine Mostafa, PharmD, BCPS  Clinical Pharmacist  Pager: [phone]

## 2016-06-17 NOTE — Progress Notes (Addendum)
Progress Note  Patient Name: Jonathan Terry Date of Encounter: 06/17/2016  Primary Cardiologist: [facility]  Subjective   No chest pain or dyspnea. Quiet night.  Inpatient Medications    Scheduled Meds: . aspirin EC  81 mg Oral Daily  . feeding supplement (NEPRO CARB STEADY)  237 mL Oral BID BM  . heparin subcutaneous  5,000 Units Subcutaneous Q8H  . pantoprazole (PROTONIX) IV  40 mg Intravenous Q24H  . rosuvastatin  10 mg Oral q1800  . sodium chloride flush  3 mL Intravenous Q12H   Continuous Infusions: . sodium chloride 30 mL/hr at 06/17/16 0700  . sodium chloride    . sodium chloride    . sodium chloride    . sodium chloride     PRN Meds: sodium chloride, sodium chloride, sodium chloride, acetaminophen, bisacodyl, hydrALAZINE, lidocaine (PF), lidocaine-prilocaine, oxyCODONE, pentafluoroprop-tetrafluoroeth, sodium chloride flush   Vital Signs    Vitals:   06/17/16 0835 06/17/16 0840 06/17/16 0845 06/17/16 0900  BP: (!) 177/84 (!) 179/87 (!) 194/90 (!) 183/84  Pulse: 73 72 72 73  Resp:  12    Temp:      TempSrc:      SpO2:      Weight:      Height:        Intake/Output Summary (Last 24 hours) at 06/17/16 0911 Last data filed at 06/17/16 0700  Gross per 24 hour  Intake             1330 ml  Output                1 ml  Net             1329 ml   Filed Weights   06/16/16 1032 06/17/16 0500 06/17/16 0830  Weight: 149 lb 0.5 oz (67.6 kg) 145 lb 15.1 oz (66.2 kg) 143 lb 1.3 oz (64.9 kg)    Telemetry    Normal sinus rhythm and sinus bradycardia - Personally Reviewed  ECG    No new tracing - Personally Reviewed  Physical Exam  Resting and calm. GEN: No acute distress.   Neck: No JVD Cardiac: RRR, no rubs. An S4 gallop is heard. Trivial 2/6 systolic murmur at left lower sternal border. Respiratory: Clear to auscultation bilaterally. GI: Soft, nontender, non-distended  MS: No edema; No deformity. Femoral pulses are 2+ and symmetric bilaterally as are  pedal pulses. Neuro:  Nonfocal  Psych: Normal affect   Labs    Chemistry Recent Labs Lab 06/14/16 1543  06/15/16 1057 06/16/16 0500 06/17/16 0407  NA 137  < > 139 136 134*  K 5.3*  < > 5.1 4.2 4.1  CL 97*  < > 98* 94* 94*  CO2 25  < > 28 28 26   GLUCOSE 136*  < > 99 122* 115*  BUN 56*  < > 68* 44* 53*  CREATININE 7.22*  < > 7.89* 5.76* 6.92*  CALCIUM 8.4*  < > 8.2* 8.0* 8.2*  PROT 7.3  --  6.5  --   --   ALBUMIN 3.1*  --  2.7*  2.6* 2.5*  --   AST 94*  --  34  --   --   ALT 39  --  25  --   --   ALKPHOS 209*  --  176*  --   --   BILITOT 1.2  --  0.9  --   --   GFRNONAA 7*  < > 6* 9* 8*  GFRAA 8*  < > 7* 11* 9*  ANIONGAP 15  < > 13 14 14   < > = values in this interval not displayed.   Hematology Recent Labs Lab 06/15/16 1057 06/16/16 0500 06/17/16 0830  WBC 11.7* 9.2 10.2  RBC 3.83* 3.47* 3.47*  HGB 10.5* 9.8* 9.2*  HCT 32.3* 30.1* 28.8*  MCV 84.3 86.7 83.0  MCH 27.4 28.2 26.5  MCHC 32.5 32.6 31.9  RDW 16.4* 17.0* 16.4*  PLT 218 224 194    Cardiac Enzymes Recent Labs Lab 06/14/16 1543 06/14/16 2041  TROPONINI 0.08* 0.19*    Recent Labs Lab 06/14/16 1130  TROPIPOC 0.02     BNPNo results for input(s): BNP, PROBNP in the last 168 hours.   DDimer No results for input(s): DDIMER in the last 168 hours.   Radiology    No results found.  Cardiac Studies   Echocardiogram documented RV systolic pressure of 87 mmHg this admission. LVEF 40-45% with moderate LVH.  Patient Profile     61 y.o. male with PMH of ESRD on HD, Pulmonary HTN, GERD, HTN, anemia, CVA and Tobacco/ETOH use who presented after a cardiac arrest. No prior history of coronary disease, recent low risk myocardial perfusion study and prior documentation of normal EF (late 2017). Recent documentation of severe pulmonary hypertension by echo and right heart catheterization in late 2017, magnitude uncertain.  Assessment & Plan    1. PEA/bradycardia asystolic cardiac arrest, Uncertain  etiology. Possibly related to severe pulmonary hypertension.  2. ?Chronic combined systolic and diastolic heart failure . Previous relatively recent documentation of normal left ventricular systolic function. Acute decline could be related to CPR. 3. End stage kidney disease on dialysis,  getting dialysis this morning. 4. Severe pulmonary hypertension,  pulmonary pressure will be documented by cath today. Will need evaluation for COPD/chronic lung disease. May need to have a sleep study to rule out unrecognized OSA. 5. 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  Plan left and right heart cath today with coronary angiography. If no critical coronary disease, will be able to transfer to telemetry. If severe pulmonary hypertension documented, will need to get expertise in managing.  Signed, [name], MD  06/17/2016, 9:11 AM

## 2016-06-17 NOTE — Progress Notes (Signed)
[facility] TEAM 1 - Stepdown/ICU TEAM  Jonathan Terry  ZOX:[id number] DOB: 16-Mar-1955 DOA: 06/14/2016 PCP: No primary care provider on file.    Brief Narrative:  61 yo with a history of ESRD on dialysis, hypertension, chronic anemia, tobacco abuse, and alcohol abuse who was admitted 6/4 with PEA cardiac arrest with 25 minutes for ROSC.  Mostly likely related to acute hypoxia in setting of volume overload with hx of ESRD and pulmonary HTN.  Significant Events: 6/04 Admit, cardiology and renal consulted; myoclonus > neuro consulted - intubated - EEG noted no focal findings 6/4 TTE EF 40-45%, grade 1 DD, mild MR, severe LA dilation, mod RV dilation, mod TR, PAS 87 mmHg 6/05 Awake, following commands, extubated, keppra d/ced and neuro s/o 6/7 TR[facility] assumed care   Subjective: Reports ongoing chest soreness related to CPR.  Denies substernal chest pressure, shortness of breath, fevers chills, nausea vomiting, or abdominal pain.  He is alert conversant and quite pleasant.  Assessment & Plan:  PEA / bradycardic / asystolic cardiac arrest Care per cardiology - possibly related to severe pulmonary hypertension - for cardiac cath today  Acute respiratory failure in setting of PEA cardiac arrest resolved - respiratory status is stable  Acute systolic congestive heart failure Care per cardiology - possibly simply related to above and reversible  Possible sleep apnea Per pulmonary will need sleep study as outpt  ESRD on HD M/W/F Nephrology following  Acute anoxic encephalopathy with transient myoclonus 6/04 resolved  HTN Poorly controlled - follow up post hemodialysis today with possible need to further titrate medications  Anemia of chronic kidney disease Hemoglobin is stable  DVT prophylaxis: Subcutaneous heparin Code Status: FULL CODE Family Communication: no family present at time of exam  Disposition Plan: Awaiting results of cardiac cath  Consultants:   Pulmonary Cardiology Nephrology  Antimicrobials:  None  Objective: Blood pressure (!) 176/81, pulse 69, temperature 98.5 F (36.9 C), temperature source Oral, resp. rate 12, height 5\' 4"  (1.626 m), weight 64.9 kg (143 lb 1.3 oz), SpO2 96 %.  Intake/Output Summary (Last 24 hours) at 06/17/16 1157 Last data filed at 06/17/16 0700  Gross per 24 hour  Intake             1090 ml  Output                1 ml  Net             1089 ml   Filed Weights   06/16/16 1032 06/17/16 0500 06/17/16 0830  Weight: 67.6 kg (149 lb 0.5 oz) 66.2 kg (145 lb 15.1 oz) 64.9 kg (143 lb 1.3 oz)    Examination: General: No acute respiratory distress Lungs: Clear to auscultation bilaterally without wheezes or crackles Cardiovascular: Regular rate and rhythm without murmur gallop or rub normal S1 and S2 Abdomen: Nontender, nondistended, soft, bowel sounds positive, no rebound, no ascites, no appreciable mass Extremities: No significant cyanosis, clubbing, or edema bilateral lower extremities  CBC:  Recent Labs Lab 06/14/16 1132 06/14/16 1543 06/15/16 1057 06/16/16 0500 06/17/16 0830  WBC  --  11.8* 11.7* 9.2 10.2  NEUTROABS  --  10.2*  --   --   --   HGB 10.5* 11.7* 10.5* 9.8* 9.2*  HCT 31.0* 34.3* 32.3* 30.1* 28.8*  MCV  --  87.5 84.3 86.7 83.0  PLT  --  215 218 224 194   Basic Metabolic Panel:  Recent Labs Lab 06/14/16 1543 06/14/16 2041 06/15/16 1057 06/16/16 0500 06/17/16  0407  NA 137 136 139 136 134*  K 5.3* 5.5* 5.1 4.2 4.1  CL 97* 95* 98* 94* 94*  CO2 25 27 28 28 26   GLUCOSE 136* 117* 99 122* 115*  BUN 56* 60* 68* 44* 53*  CREATININE 7.22* 7.37* 7.89* 5.76* 6.92*  CALCIUM 8.4* 8.0* 8.2* 8.0* 8.2*  MG  --   --  2.6*  --   --   PHOS  --   --  4.0 3.3  --    GFR: Estimated Creatinine Clearance: 9.1 mL/min (A) (by C-G formula based on SCr of 6.92 mg/dL (H)).  Liver Function Tests:  Recent Labs Lab 06/14/16 1543 06/15/16 1057 06/16/16 0500  AST 94* 34  --   ALT 39 25   --   ALKPHOS 209* 176*  --   BILITOT 1.2 0.9  --   PROT 7.3 6.5  --   ALBUMIN 3.1* 2.7*  2.6* 2.5*    Coagulation Profile:  Recent Labs Lab 06/14/16 1543 06/16/16 0912  INR 1.35 1.21    Cardiac Enzymes:  Recent Labs Lab 06/14/16 1543 06/14/16 2041  TROPONINI 0.08* 0.19*    CBG:  Recent Labs Lab 06/14/16 2003 06/14/16 2327 06/15/16 0323 06/15/16 0812 06/15/16 1144  GLUCAP 139* 102* 101* 104* 95    Recent Results (from the past 240 hour(s))  MRSA PCR Screening     Status: None   Collection Time: 06/14/16  2:15 PM  Result Value Ref Range Status   MRSA by PCR NEGATIVE NEGATIVE Final    Comment:        The GeneXpert MRSA Assay (FDA approved for NASAL specimens only), is one component of a comprehensive MRSA colonization surveillance program. It is not intended to diagnose MRSA infection nor to guide or monitor treatment for MRSA infections.      Scheduled Meds: . aspirin EC  81 mg Oral Daily  . feeding supplement (NEPRO CARB STEADY)  237 mL Oral BID BM  . heparin subcutaneous  5,000 Units Subcutaneous Q8H  . pantoprazole (PROTONIX) IV  40 mg Intravenous Q24H  . rosuvastatin  10 mg Oral q1800  . sodium chloride flush  3 mL Intravenous Q12H    LOS: 3 days   Lonia BloodJeffrey T. McClung, MD [facility] Office  [phone] Pager - Text Page per Amion as per below:  On-Call/Text Page:      [email or web address]      password [email or web address]  If 7PM-7AM, please contact night-coverage [email or web address] Password [email or web address] 06/17/2016, 11:57 AM

## 2016-06-17 NOTE — Interval H&P Note (Signed)
History and Physical Interval Note:  06/17/2016 2:52 PM  Jonathan Terry  has presented today for cardiac cath with the diagnosis of cardiomyopathy, cardiac arrest  The various methods of treatment have been discussed with the patient and family. After consideration of risks, benefits and other options for treatment, the patient has consented to  Procedure(s): Right/Left Heart Cath and Coronary Angiography (N/A) as a surgical intervention .  The patient's history has been reviewed, patient examined, no change in status, stable for surgery.  I have reviewed the patient's chart and labs.  Questions were answered to the patient's satisfaction.    Cath Lab Visit (complete for each Cath Lab visit)  Clinical Evaluation Leading to the Procedure:   ACS: Yes.    Non-ACS:    Anginal Classification: No angina  Anti-ischemic medical therapy: No Therapy  Non-Invasive Test Results: No non-invasive testing performed  Prior CABG: No previous CABG         Verne Carrowhristopher Deborha Moseley

## 2016-06-17 NOTE — Progress Notes (Signed)
Right femoral sheath pulled at 2215. Manual pressure applied for 20 minutes. Two RNs and atropine at bedside. VS stable throughout. Site level "0." ?   Pt educated on mobility restrictions and potential complications, including when to contact RN.  Pt tolerated procedure well. Will continue to monitor pt and site closely. ?   Frutoso ChaseKristen M Deandrea Rion, RN

## 2016-06-18 ENCOUNTER — Encounter (HOSPITAL_COMMUNITY): Payer: Self-pay | Admitting: Cardiovascular Disease

## 2016-06-18 DIAGNOSIS — I25110 Atherosclerotic heart disease of native coronary artery with unstable angina pectoris: Secondary | ICD-10-CM

## 2016-06-18 DIAGNOSIS — I5021 Acute systolic (congestive) heart failure: Secondary | ICD-10-CM

## 2016-06-18 DIAGNOSIS — G9340 Encephalopathy, unspecified: Secondary | ICD-10-CM

## 2016-06-18 DIAGNOSIS — Z992 Dependence on renal dialysis: Secondary | ICD-10-CM

## 2016-06-18 DIAGNOSIS — R001 Bradycardia, unspecified: Secondary | ICD-10-CM

## 2016-06-18 DIAGNOSIS — Z862 Personal history of diseases of the blood and blood-forming organs and certain disorders involving the immune mechanism: Secondary | ICD-10-CM

## 2016-06-18 LAB — HEPARIN LEVEL (UNFRACTIONATED): Heparin Unfractionated: <0.1 IU/mL — ABNORMAL LOW (ref 0.30–0.70)

## 2016-06-18 LAB — CBC
HEMATOCRIT: 30.4 % — AB (ref 39.0–52.0)
HEMOGLOBIN: 9.9 g/dL — AB (ref 13.0–17.0)
MCH: 27.9 pg (ref 26.0–34.0)
MCHC: 32.6 g/dL (ref 30.0–36.0)
MCV: 85.6 fL (ref 78.0–100.0)
Platelets: 227 10*3/uL (ref 150–400)
RBC: 3.55 MIL/uL — AB (ref 4.22–5.81)
RDW: 16.6 % — ABNORMAL HIGH (ref 11.5–15.5)
WBC: 7.5 10*3/uL (ref 4.0–10.5)

## 2016-06-18 LAB — BASIC METABOLIC PANEL
ANION GAP: 10 (ref 5–15)
BUN: 26 mg/dL — ABNORMAL HIGH (ref 6–20)
CALCIUM: 8 mg/dL — AB (ref 8.9–10.3)
CO2: 28 mmol/L (ref 22–32)
Chloride: 96 mmol/L — ABNORMAL LOW (ref 101–111)
Creatinine, Ser: 4.74 mg/dL — ABNORMAL HIGH (ref 0.61–1.24)
GFR, EST AFRICAN AMERICAN: 14 mL/min — AB (ref >60)
GFR, EST NON AFRICAN AMERICAN: 12 mL/min — AB (ref >60)
Glucose, Bld: 90 mg/dL (ref 65–99)
POTASSIUM: 3.7 mmol/L (ref 3.5–5.1)
Sodium: 134 mmol/L — ABNORMAL LOW (ref 135–145)

## 2016-06-18 LAB — MAGNESIUM: Magnesium: 2 mg/dL (ref 1.7–2.4)

## 2016-06-18 MED ORDER — METOPROLOL SUCCINATE ER 25 MG PO TB24
12.5000 mg | ORAL_TABLET | Freq: Every day | ORAL | Status: DC
  Administered 2016-06-18 – 2016-06-19 (×2): 12.5 mg via ORAL
  Filled 2016-06-18 (×2): qty 1

## 2016-06-18 MED ORDER — HEPARIN SODIUM (PORCINE) 5000 UNIT/ML IJ SOLN
5000.0000 [IU] | Freq: Three times a day (TID) | INTRAMUSCULAR | Status: DC
  Administered 2016-06-18 – 2016-06-20 (×6): 5000 [IU] via SUBCUTANEOUS
  Filled 2016-06-18 (×6): qty 1

## 2016-06-18 MED ORDER — POTASSIUM CHLORIDE CRYS ER 20 MEQ PO TBCR
40.0000 meq | EXTENDED_RELEASE_TABLET | Freq: Once | ORAL | Status: AC
  Administered 2016-06-18: 40 meq via ORAL
  Filled 2016-06-18: qty 2

## 2016-06-18 MED FILL — Medication: Qty: 1 | Status: AC

## 2016-06-18 NOTE — Evaluation (Addendum)
Physical Therapy Evaluation Patient Details Name: Jonathan Terry MRN: [id number] DOB: [birth year] Today's Date: 06/18/2016   History of Present Illness  61yo admitted with PEA 6/4, extubated 6/5. PMHx: ESRD, HTN, anemia, CVA, pulmonary HTN  Clinical Impression  Pt very pleasant and moving well. Pt able to ambulate hallway with decreased speed, unsteady gait and requires assist for stairs. Pt with decreased activity tolerance, balance and gait who will benefit from acute therapy to maximize mobility, function and independence for safe return home.   5 min walk = 550'  HR 67-75 sats 98% on RA BP pre-gait 130/71 Post-gait 126/69    Follow Up Recommendations Home health PT (HHPT only if unable to return to Mod I level prior to D/C)    Equipment Recommendations  Rolling walker with 5" wheels    Recommendations for Other Services       Precautions / Restrictions Precautions Precautions: Fall      Mobility  Bed Mobility               General bed mobility comments: in chair on arrival  Transfers Overall transfer level: Modified independent                  Ambulation/Gait Ambulation/Gait assistance: Supervision Ambulation Distance (Feet): 650 Feet Assistive device: None Gait Pattern/deviations: Step-through pattern;Decreased stride length Gait velocity: 30'/18 sec= 1.67 Gait velocity interpretation: <1.8 ft/sec, indicative of risk for recurrent falls General Gait Details: pt with slightly unsteady gait with report of leg weakness. pt without overt LOB and decreased speed  Stairs Stairs: Yes Stairs assistance: Min assist Stair Management: Step to pattern;Forwards;One rail Right Number of Stairs: 3 General stair comments: pt unable to ascend stairs without rail or min assist, encouraged installation of rail at home  Wheelchair Mobility    Modified Rankin (Stroke Patients Only)       Balance Overall balance assessment: Needs assistance   Sitting  balance-Leahy Scale: Good       Standing balance-Leahy Scale: Good                               Pertinent Vitals/Pain Pain Assessment: No/denies pain    Home Living Family/patient expects to be discharged to:: Private residence Living Arrangements: Non-relatives/Friends Available Help at Discharge: Friend(s);Available PRN/intermittently Type of Home: House Home Access: Stairs to enter Entrance Stairs-Rails: None Entrance Stairs-Number of Steps: 3 Home Layout: One level Home Equipment: None      Prior Function Level of Independence: Independent               Hand Dominance        Extremity/Trunk Assessment   Upper Extremity Assessment Upper Extremity Assessment: Overall WFL for tasks assessed    Lower Extremity Assessment Lower Extremity Assessment: Generalized weakness    Cervical / Trunk Assessment Cervical / Trunk Assessment: Normal  Communication   Communication: No difficulties (primary language is Spanish but speaks [place])  Cognition Arousal/Alertness: Awake/alert Behavior During Therapy: WFL for tasks assessed/performed Overall Cognitive Status: Within Functional Limits for tasks assessed                                        General Comments      Exercises     Assessment/Plan    PT Assessment Patient needs continued PT services  PT Problem List Decreased mobility;Decreased activity  tolerance;Decreased balance       PT Treatment Interventions Gait training;Therapeutic exercise;Patient/family education;Stair training;Balance training;Functional mobility training;Therapeutic activities    PT Goals (Current goals can be found in the Care Plan section)  Acute Rehab PT Goals Patient Stated Goal: return home PT Goal Formulation: With patient Time For Goal Achievement: 07/02/16 Potential to Achieve Goals: Good    Frequency Min 3X/week   Barriers to discharge Decreased caregiver support       Co-evaluation               AM-PAC PT "6 Clicks" Daily Activity  Outcome Measure Difficulty turning over in bed (including adjusting bedclothes, sheets and blankets)?: None Difficulty moving from lying on back to sitting on the side of the bed? : None Difficulty sitting down on and standing up from a chair with arms (e.g., wheelchair, bedside commode, etc,.)?: None Help needed moving to and from a bed to chair (including a wheelchair)?: None Help needed walking in hospital room?: A Little Help needed climbing 3-5 steps with a railing? : A Little 6 Click Score: 22    End of Session Equipment Utilized During Treatment: Gait belt Activity Tolerance: Patient tolerated treatment well Patient left: in chair;with call bell/phone within reach Nurse Communication: Mobility status PT Visit Diagnosis: Other abnormalities of gait and mobility (R26.89);Unsteadiness on feet (R26.81)    Time: 1610-9604 PT Time Calculation (min) (ACUTE ONLY): 21 min   Charges:   PT Evaluation $PT Eval Moderate Complexity: 1 Procedure     PT G Codes:        Delaney Meigs, PT [phone]   Nahzir Pohle B Iniya Matzek 06/18/2016, 12:44 PM

## 2016-06-18 NOTE — Progress Notes (Signed)
PROGRESS NOTE    Jonathan Terry  [id number] DOB: 08-13-1955 DOA: 06/14/2016 PCP: No primary care provider on file.   Brief Narrative:  61 yo WM  PMHx HTN, Pulmonary HTN, CVA, ESRD on HD,, HTN, Cchronic Anemia, Tobacco abuse, ETOH abuse (quit 2012),   Admitted 6/4 with PEA cardiac arrest with 25 minutes for ROSC. Mostly likely related to acute hypoxia in setting of volume overload with hx of ESRD and pulmonary HTN.    Subjective: 6/8  A/O 2 (does not know when, why) . Does not recall incident (PEA). Negative CP negative SOB   Assessment & Plan:   Principal Problem:   Cardiac arrest [facility](HCC) Active Problems:   Pulmonary hypertension, moderate to severe (HCC)   End stage renal disease (HCC)   Essential hypertension   Shortness of breath   Acute respiratory failure with hypoxia (HCC)   Acute on chronic combined systolic and diastolic CHF (congestive heart failure) (HCC)   Coronary artery disease involving native coronary artery of native heart with unstable angina pectoris (HCC)   PEA / bradycardic / asystolic cardiac arrest -Spoke at length with cardiology who stated patient has no vascular or electrical problem with his heart to explain cardiac arrest. Patient however does have severe pulmonary hypertension.  Pulmonary hypertension   -Considering patient's severe pulmonary hypertension, a workup is warranted.      -CT angiogram negative for PE, however did show lung abnormality see results below  -Atelectasis most likely secondary to prolonged cardiac arrest. Incentive spirometry, aggressive pulmonary toilet -Obtain PFT  -Will require sleep study as outpatient to complete evaluation  -Obtain ANA, RF, PANCA/CANCA, HIV pending -Reconsulted PCCM   Acute Systolic CHF  -Care per cardiology  - possibly simply related to above and reversible  ESRD on HD M/W/F Nephrology following  Acute anoxic encephalopathy with transient myoclonus 6/04 resolved  Essential  HTN/Hypotension -Poorly controlled - follow up post hemodialysis today with possible need to further titrate medications -Patient appears to suffer from post HD Hypotension which may have contributed to his PEA arrest along with severe pulmonary hypertension. Would be judicious with BP medication  Anemia of chronic kidney disease Hemoglobin is stable  Hypokalemia -Potassium goal> 4 -K Dur 40 mEq 1    DVT prophylaxis: Subcutaneous heparin Code Status: FULL CODE Family Communication: no family present at time of exam  Disposition Plan:     Consultants:  Pulmonary Cardiology Nephrology    Procedures/Significant Events:  6/04 Admit, cardiology and renal consulted; myoclonus > neuro consulted - intubated - EEG noted no focal findings 6/4 TTE EF 40-45%, grade 1 DD, mild MR, severe LA dilation, mod RV dilation, mod TR, PAS 87 mmHg   6/4 CT angiogram chest PE protocol: Negative PE  -Near complete left lower lobe atelectasis. Segmental lingular atelectasis. Moderate compressive atelectasis in the dependent right lung. 6/05 Awake, following commands, extubated, keppra d/ced and neuro s/o 6/7 TRH assumed care    VENTILATOR SETTINGS:    Cultures   Antimicrobials: Anti-infectives    None       Devices    LINES / TUBES:      Continuous Infusions: . sodium chloride Stopped (06/17/16 2215)  . heparin 750 Units/hr (06/18/16 0552)     Objective: Vitals:   06/18/16 0500 06/18/16 0600 06/18/16 0700 06/18/16 0725  BP: (!) 158/66   (!) 163/66  Pulse: 70 68 68 71  Resp: 12 13 12 18   Temp:      TempSrc:      SpO2:  97% 97% 98% 98%  Weight:      Height:        Intake/Output Summary (Last 24 hours) at 06/18/16 0744 Last data filed at 06/18/16 0700  Gross per 24 hour  Intake            721.5 ml  Output             3476 ml  Net          -2754.5 ml   Filed Weights   06/17/16 0830 06/17/16 1205 06/17/16 1300  Weight: 143 lb 1.3 oz (64.9 kg) 134 lb 0.6 oz  (60.8 kg) 136 lb 7.4 oz (61.9 kg)    Examination:  General: A/O 2 (does not know when, why) No acute respiratory distress Eyes: negative scleral hemorrhage, negative anisocoria, negative icterus ENT: Negative Runny nose, negative gingival bleeding, Neck:  Negative scars, masses, torticollis, lymphadenopathy, JVD Lungs: Clear to auscultation bilaterally without wheezes or crackles Cardiovascular: Regular rate and rhythm without murmur gallop or rub normal S1 and S2 Abdomen: negative abdominal pain, nondistended, positive soft, bowel sounds, no rebound, no ascites, no appreciable mass Extremities: No significant cyanosis, clubbing, or edema bilateral lower extremities Skin: Negative rashes, lesions, ulcers Psychiatric:  Negative depression, negative anxiety, negative fatigue, negative mania  Central nervous system:  Cranial nerves II through XII intact, tongue/uvula midline, all extremities muscle strength 5/5, sensation intact throughout, negative dysarthria, negative expressive aphasia, negative receptive aphasia.  .     Data Reviewed: Care during the described time interval was provided by me .  I have reviewed this patient's available data, including medical history, events of note, physical examination, and all test results as part of my evaluation. I have personally reviewed and interpreted all radiology studies.  CBC:  Recent Labs Lab 06/14/16 1543 06/15/16 1057 06/16/16 0500 06/17/16 0830 06/18/16 0401  WBC 11.8* 11.7* 9.2 10.2 7.5  NEUTROABS 10.2*  --   --   --   --   HGB 11.7* 10.5* 9.8* 9.2* 9.9*  HCT 34.3* 32.3* 30.1* 28.8* 30.4*  MCV 87.5 84.3 86.7 83.0 85.6  PLT 215 218 224 194 227   Basic Metabolic Panel:  Recent Labs Lab 06/14/16 2041 06/15/16 1057 06/16/16 0500 06/17/16 0407 06/18/16 0401  NA 136 139 136 134* 134*  K 5.5* 5.1 4.2 4.1 3.7  CL 95* 98* 94* 94* 96*  CO2 27 28 28 26 28   GLUCOSE 117* 99 122* 115* 90  BUN 60* 68* 44* 53* 26*  CREATININE  7.37* 7.89* 5.76* 6.92* 4.74*  CALCIUM 8.0* 8.2* 8.0* 8.2* 8.0*  MG  --  2.6*  --   --   --   PHOS  --  4.0 3.3  --   --    GFR: Estimated Creatinine Clearance: 13.4 mL/min (A) (by C-G formula based on SCr of 4.74 mg/dL (H)). Liver Function Tests:  Recent Labs Lab 06/14/16 1543 06/15/16 1057 06/16/16 0500  AST 94* 34  --   ALT 39 25  --   ALKPHOS 209* 176*  --   BILITOT 1.2 0.9  --   PROT 7.3 6.5  --   ALBUMIN 3.1* 2.7*  2.6* 2.5*   No results for input(s): LIPASE, AMYLASE in the last 168 hours. No results for input(s): AMMONIA in the last 168 hours. Coagulation Profile:  Recent Labs Lab 06/14/16 1543 06/16/16 0912  INR 1.35 1.21   Cardiac Enzymes:  Recent Labs Lab 06/14/16 1543 06/14/16 2041  TROPONINI 0.08* 0.19*  BNP (last 3 results) No results for input(s): PROBNP in the last 8760 hours. HbA1C: No results for input(s): HGBA1C in the last 72 hours. CBG:  Recent Labs Lab 06/14/16 2003 06/14/16 2327 06/15/16 0323 06/15/16 0812 06/15/16 1144  GLUCAP 139* 102* 101* 104* 95   Lipid Profile: No results for input(s): CHOL, HDL, LDLCALC, TRIG, CHOLHDL, LDLDIRECT in the last 72 hours. Thyroid Function Tests:  Recent Labs  06/16/16 0912  TSH 3.696   Anemia Panel: No results for input(s): VITAMINB12, FOLATE, FERRITIN, TIBC, IRON, RETICCTPCT in the last 72 hours. Urine analysis: No results found for: COLORURINE, APPEARANCEUR, LABSPEC, PHURINE, GLUCOSEU, HGBUR, BILIRUBINUR, KETONESUR, PROTEINUR, UROBILINOGEN, NITRITE, LEUKOCYTESUR Sepsis Labs: @LABRCNTIP (procalcitonin:4,lacticidven:4)  ) Recent Results (from the past 240 hour(s))  MRSA PCR Screening     Status: None   Collection Time: 06/14/16  2:15 PM  Result Value Ref Range Status   MRSA by PCR NEGATIVE NEGATIVE Final    Comment:        The GeneXpert MRSA Assay (FDA approved for NASAL specimens only), is one component of a comprehensive MRSA colonization surveillance program. It is  not intended to diagnose MRSA infection nor to guide or monitor treatment for MRSA infections.          Radiology Studies: No results found.      Scheduled Meds: . aspirin EC  81 mg Oral Daily  . feeding supplement (NEPRO CARB STEADY)  237 mL Oral BID BM  . pantoprazole (PROTONIX) IV  40 mg Intravenous Q24H  . rosuvastatin  10 mg Oral q1800  . sodium chloride flush  3 mL Intravenous Q12H   Continuous Infusions: . sodium chloride Stopped (06/17/16 2215)  . heparin 750 Units/hr (06/18/16 0552)     LOS: 4 days    Time spent: 40 minutes    Jahnasia Tatum, Roselind Messier, MD [facility] Pager [phone]   If 7PM-7AM, please contact night-coverage [email or web address] Password [facility] 06/18/2016, 7:44 AM

## 2016-06-18 NOTE — Procedures (Signed)
Patient refused CPAP for the night  

## 2016-06-18 NOTE — Progress Notes (Signed)
Admit: 06/14/2016 LOS: 4  31M ESRD with cardiac arrest 6/4  Subjective:  LHC/RHC yesterday; no PCI, to be considered HD yesterday: 3.5L UF 60.8kg post weight No c/o this AM  06/07 0701 - 06/08 0700 In: 721.5 [P.O.:420; I.V.:301.5] Out: 3476   Filed Weights   06/17/16 0830 06/17/16 1205 06/17/16 1300  Weight: 64.9 kg (143 lb 1.3 oz) 60.8 kg (134 lb 0.6 oz) 61.9 kg (136 lb 7.4 oz)    Scheduled Meds: . aspirin EC  81 mg Oral Daily  . feeding supplement (NEPRO CARB STEADY)  237 mL Oral BID BM  . pantoprazole (PROTONIX) IV  40 mg Intravenous Q24H  . potassium chloride  40 mEq Oral Once  . rosuvastatin  10 mg Oral q1800  . sodium chloride flush  3 mL Intravenous Q12H   Continuous Infusions: . sodium chloride Stopped (06/17/16 2215)  . heparin 750 Units/hr (06/18/16 0552)   PRN Meds:.sodium chloride, acetaminophen, bisacodyl, hydrALAZINE, oxyCODONE, sodium chloride flush  Current Labs: reviewed    Physical Exam:  Blood pressure (!) 167/73, pulse 71, temperature 98.6 F (37 C), temperature source Oral, resp. rate 18, height 5\' 4"  (1.626 m), weight 61.9 kg (136 lb 7.4 oz), SpO2 96 %. awake, alert RRR Coarse bs b/o No LEE +B/T of AVF S/ntn/d  Dialysis Orders: Center: [facility]  on MWF . EDW 60.0 kg HD Bath 2k, 2.0Ca   Time 4hr Heparin None . Access LUA AVF     Calcitriol 0.75 mcg IV/HD , Mircera 75mcg q 4wks  (last given 05/28/16)   Other  OP labs = hgb 11.1 06/09/16  pth 314   Ca 9.3  Phos  2.9   A 1. ESRD MWF Guy LUE AVF 2. PEA Cardiac Arrest 06/14/16 3. Hyperkalemia, resolved 4. HTN, improved this AM 5. Anemia, stable on outpt ESA 6. CKD BMD, Ca and P at goal 7. Pulm HTN  P  HD tomorrow, then next week on MWF schedule; 2K, post weight target 59.5kg, no heparin  Sabra Heckyan Cowan Pilar MD 06/18/2016, 8:09 AM   Recent Labs Lab 06/15/16 1057 06/16/16 0500 06/17/16 0407 06/18/16 0401  NA 139 136 134* 134*  K 5.1 4.2 4.1 3.7  CL 98* 94* 94* 96*  CO2 28 28 26 28   GLUCOSE  99 122* 115* 90  BUN 68* 44* 53* 26*  CREATININE 7.89* 5.76* 6.92* 4.74*  CALCIUM 8.2* 8.0* 8.2* 8.0*  PHOS 4.0 3.3  --   --     Recent Labs Lab 06/14/16 1543  06/16/16 0500 06/17/16 0830 06/18/16 0401  WBC 11.8*  < > 9.2 10.2 7.5  NEUTROABS 10.2*  --   --   --   --   HGB 11.7*  < > 9.8* 9.2* 9.9*  HCT 34.3*  < > 30.1* 28.8* 30.4*  MCV 87.5  < > 86.7 83.0 85.6  PLT 215  < > 224 194 227  < > = values in this interval not displayed.          d

## 2016-06-18 NOTE — Progress Notes (Addendum)
Progress Note  Patient Name: Jonathan Terry Date of Encounter: 06/18/2016  Primary Cardiologist: Jonathan Terry (New)  Subjective   He has no complaints. He denies dyspnea and chest discomfort.  Inpatient Medications    Scheduled Meds: . aspirin EC  81 mg Oral Daily  . feeding supplement (NEPRO CARB STEADY)  237 mL Oral BID BM  . pantoprazole (PROTONIX) IV  40 mg Intravenous Q24H  . potassium chloride  40 mEq Oral Once  . rosuvastatin  10 mg Oral q1800  . sodium chloride flush  3 mL Intravenous Q12H   Continuous Infusions: . sodium chloride Stopped (06/17/16 2215)  . heparin 750 Units/hr (06/18/16 0552)   PRN Meds: sodium chloride, acetaminophen, bisacodyl, hydrALAZINE, oxyCODONE, sodium chloride flush   Vital Signs    Vitals:   06/18/16 0600 06/18/16 0700 06/18/16 0725 06/18/16 0800  BP:   (!) 163/66 (!) 167/73  Pulse: 68 68 71 71  Resp: 13 12 18 18   Temp:    98.3 F (36.8 C)  TempSrc:    Oral  SpO2: 97% 98% 98% 96%  Weight:      Height:        Intake/Output Summary (Last 24 hours) at 06/18/16 0812 Last data filed at 06/18/16 0700  Gross per 24 hour  Intake            721.5 ml  Output             3476 ml  Net          -2754.5 ml   Filed Weights   06/17/16 0830 06/17/16 1205 06/17/16 1300  Weight: 143 lb 1.3 oz (64.9 kg) 134 lb 0.6 oz (60.8 kg) 136 lb 7.4 oz (61.9 kg)    Telemetry    Sinus bradycardia without significant ectopy - Personally Reviewed  ECG    No new tracing - Personally Reviewed  Physical Exam   GEN: No acute distress.  He is lying flat in bed without desaturation or dyspnea. Neck: No JVD Cardiac: RRR, no murmurs, rubs, or gallops. . Right femoral access site is unremarkable Respiratory: Clear to auscultation bilaterally. GI: Soft, nontender, non-distended  MS: No edema; No deformity. Neuro:  Nonfocal  Psych: Normal affect   Labs    Chemistry Recent Labs Lab 06/14/16 1543  06/15/16 1057 06/16/16 0500 06/17/16 0407  06/18/16 0401  NA 137  < > 139 136 134* 134*  K 5.3*  < > 5.1 4.2 4.1 3.7  CL 97*  < > 98* 94* 94* 96*  CO2 25  < > 28 28 26 28   GLUCOSE 136*  < > 99 122* 115* 90  BUN 56*  < > 68* 44* 53* 26*  CREATININE 7.22*  < > 7.89* 5.76* 6.92* 4.74*  CALCIUM 8.4*  < > 8.2* 8.0* 8.2* 8.0*  PROT 7.3  --  6.5  --   --   --   ALBUMIN 3.1*  --  2.7*  2.6* 2.5*  --   --   AST 94*  --  34  --   --   --   ALT 39  --  25  --   --   --   ALKPHOS 209*  --  176*  --   --   --   BILITOT 1.2  --  0.9  --   --   --   GFRNONAA 7*  < > 6* 9* 8* 12*  GFRAA 8*  < > 7* 11* 9* 14*  ANIONGAP 15  < > 13 14 14 10   < > = values in this interval not displayed.   Hematology Recent Labs Lab 06/16/16 0500 06/17/16 0830 06/18/16 0401  WBC 9.2 10.2 7.5  RBC 3.47* 3.47* 3.55*  HGB 9.8* 9.2* 9.9*  HCT 30.1* 28.8* 30.4*  MCV 86.7 83.0 85.6  MCH 28.2 26.5 27.9  MCHC 32.6 31.9 32.6  RDW 17.0* 16.4* 16.6*  PLT 224 194 227    Cardiac Enzymes Recent Labs Lab 06/14/16 1543 06/14/16 2041  TROPONINI 0.08* 0.19*    Recent Labs Lab 06/14/16 1130  TROPIPOC 0.02     BNPNo results for input(s): BNP, PROBNP in the last 168 hours.   DDimer No results for input(s): DDIMER in the last 168 hours.   Radiology    No results found.  Cardiac Studies   Cardiac catheterization 06/17/16: (Performed post dialysis) Coronary Diagrams   Diagnostic Diagram        HEMODYNAMIC DATA:  Right atrial pressure (mean) - 7 mmHg Right ventricular pressure - 70/9 mmHg Pulmonary artery pressure - 70/23 mmHg Pulmonary capillary wedge pressure (mean) - 19 mmHg Aortic pressure - 189/78 mmHg LV pressure - 197/12 mmHg, EDP 21 mmHg Fick cardiac output - 7.19 L/m Pulmonary vascular resistance - 256 D/S     Patient Profile     61 y.o. male with PMH of ESRD on HD, Pulmonary HTN, GERD, HTN, anemia, CVA and Tobacco/ETOH use who presented after a cardiac arrest. No prior history of   Signed, [name], MD  06/18/2016,  8:12 AM  coronary disease, recent low risk myocardial perfusion study and prior documentation of normal EF (late 2017). Recent documentation of severe pulmonary hypertension by echo. Cardiac cath demonstrated severe ostial narrowing of the PDA but otherwise no significant coronary obstruction. Moderately severe pulmonary hypertension (post dialysis) was also documented.  Assessment & Plan    1.PEA/Brady-Asystolic arrest - possibly related to severe pulmonary hypertension and volume status. This is not usually the mechanism for ischemia related cardiac arrest. The finding of high-grade obstruction in the PDA is probably an innocent bystander which we will treat with medical therapy (antiplatelet therapy and beta blocker therapy). 2. Coronary artery disease documented by catheterization with 99% PDA ostial obstruction best treated with medical therapy. 3. Severe pulmonary hypertension - PA systolic pressure 70 mmHg immediately post dialysis with significant volume removal 4. End-stage kidney disease on dialysis  5. Prior CVA with ongoing mild confusion possibly related to anoxic brain injury.  Plan today is to add low-dose beta blocker therapy (if tolerated by heart rate), continue aspirin, ambulate with cardiac rehabilitation, and transferred to telemetry. Probably needs physical therapy recommendation before discharge.

## 2016-06-19 LAB — BASIC METABOLIC PANEL
Anion gap: 12 (ref 5–15)
BUN: 38 mg/dL — AB (ref 6–20)
CALCIUM: 8.4 mg/dL — AB (ref 8.9–10.3)
CO2: 27 mmol/L (ref 22–32)
CREATININE: 5.95 mg/dL — AB (ref 0.61–1.24)
Chloride: 96 mmol/L — ABNORMAL LOW (ref 101–111)
GFR calc non Af Amer: 9 mL/min — ABNORMAL LOW (ref >60)
GFR, EST AFRICAN AMERICAN: 10 mL/min — AB (ref >60)
Glucose, Bld: 89 mg/dL (ref 65–99)
Potassium: 4.7 mmol/L (ref 3.5–5.1)
SODIUM: 135 mmol/L (ref 135–145)

## 2016-06-19 LAB — MAGNESIUM: MAGNESIUM: 2.1 mg/dL (ref 1.7–2.4)

## 2016-06-19 LAB — HIV ANTIBODY (ROUTINE TESTING W REFLEX): HIV Screen 4th Generation wRfx: NONREACTIVE

## 2016-06-19 MED ORDER — METOPROLOL SUCCINATE ER 25 MG PO TB24
25.0000 mg | ORAL_TABLET | Freq: Every day | ORAL | Status: DC
  Administered 2016-06-20: 25 mg via ORAL
  Filled 2016-06-19: qty 1

## 2016-06-19 MED ORDER — ISOSORBIDE DINITRATE 10 MG PO TABS
10.0000 mg | ORAL_TABLET | Freq: Three times a day (TID) | ORAL | Status: DC
  Administered 2016-06-19 – 2016-06-20 (×5): 10 mg via ORAL
  Filled 2016-06-19 (×5): qty 1

## 2016-06-19 MED ORDER — PANTOPRAZOLE SODIUM 40 MG PO TBEC
40.0000 mg | DELAYED_RELEASE_TABLET | Freq: Every day | ORAL | Status: DC
  Administered 2016-06-19 – 2016-06-20 (×2): 40 mg via ORAL
  Filled 2016-06-19 (×3): qty 1

## 2016-06-19 NOTE — Progress Notes (Signed)
TEAM 1 - Stepdown/ICU TEAM  Jonathan Terry  ZOX:[id number] DOB: 1955/03/05 DOA: 06/14/2016 PCP: No primary care provider on file.    Brief Narrative:  61 yo with a history of ESRD on dialysis, hypertension, chronic anemia, tobacco abuse, and alcohol abuse who was admitted 6/4 with PEA cardiac arrest with 25 minutes for ROSC.  Mostly likely related to acute hypoxia in setting of volume overload with hx of ESRD and pulmonary HTN.  Significant Events: 6/04 Admit, cardiology and renal consulted; myoclonus > neuro consulted - intubated - EEG noted no focal findings 6/4 TTE EF 40-45%, grade 1 DD, mild MR, severe LA dilation, mod RV dilation, mod TR, PAS 87 mmHg 6/05 Awake, following commands, extubated, keppra d/ced and neuro s/o 6/7 TRH assumed care - cardiac cath   Subjective: Doing well today.  Tells me he is not getting up and moving around much.  Chest pain is improving.  No sob, n/v, or abdom pain.    Assessment & Plan:  PEA / bradycardic / asystolic cardiac arrest Care per Cardiology - possibly related to severe pulmonary hypertension - cardiac cath noted 3 vessel disease, but only severe lesion was in PDA - med tx is recommended - not felt to explain his arrest   Acute respiratory failure in setting of PEA cardiac arrest resolved - respiratory status is stable presently   Moderately severe Pulm HTN PA pressure 70/23 via cath - Pulm MD to see patient today and advise on further w/u   Acute systolic congestive heart failure Care per Cardiology - possibly simply related to above and reversible - volume control per HD  Possible sleep apnea Per Pulmonary will need sleep study as outpt  ESRD on HD M/W/F Nephrology following and attending to HD   Acute anoxic encephalopathy with transient myoclonus 6/04 resolved  HTN Poorly controlled - adjust medical tx and follow - will need to watch post-dialysis to assure severe hypotension not encountered w/ fluid removal    Anemia of chronic kidney disease Hemoglobin stable  DVT prophylaxis: Subcutaneous heparin Code Status: FULL CODE Family Communication: no family present at time of exam  Disposition Plan: ambulate - await recs from Pulmonary - anticipate d/c home 6/10  Consultants:  Pulmonary Cardiology Nephrology  Antimicrobials:  None  Objective: Blood pressure (!) 163/70, pulse 74, temperature 98 F (36.7 C), temperature source Oral, resp. rate 16, height 5\' 4"  (1.626 m), weight 59.5 kg (131 lb 2.8 oz), SpO2 98 %.  Intake/Output Summary (Last 24 hours) at 06/19/16 1340 Last data filed at 06/19/16 1055  Gross per 24 hour  Intake              240 ml  Output             3000 ml  Net            -2760 ml   Filed Weights   06/19/16 0530 06/19/16 0725 06/19/16 1055  Weight: 63 kg (138 lb 14.4 oz) 62.6 kg (138 lb 0.1 oz) 59.5 kg (131 lb 2.8 oz)    Examination: General: No acute respiratory distress at rest in bed  Lungs: Clear to auscultation bilaterally - no wheezing  Cardiovascular: RRR w/o murmer  Abdomen: Nontender, nondistended, soft, bowel sounds positive, no rebound, no ascites, no appreciable mass Extremities: No edema B LE   CBC:  Recent Labs Lab 06/14/16 1543 06/15/16 1057 06/16/16 0500 06/17/16 0830 06/18/16 0401  WBC 11.8* 11.7* 9.2 10.2 7.5  NEUTROABS 10.2*  --   --   --   --  HGB 11.7* 10.5* 9.8* 9.2* 9.9*  HCT 34.3* 32.3* 30.1* 28.8* 30.4*  MCV 87.5 84.3 86.7 83.0 85.6  PLT 215 218 224 194 227   Basic Metabolic Panel:  Recent Labs Lab 06/15/16 1057 06/16/16 0500 06/17/16 0407 06/18/16 0401 06/18/16 0757 06/19/16 0417  NA 139 136 134* 134*  --  135  K 5.1 4.2 4.1 3.7  --  4.7  CL 98* 94* 94* 96*  --  96*  CO2 28 28 26 28   --  27  GLUCOSE 99 122* 115* 90  --  89  BUN 68* 44* 53* 26*  --  38*  CREATININE 7.89* 5.76* 6.92* 4.74*  --  5.95*  CALCIUM 8.2* 8.0* 8.2* 8.0*  --  8.4*  MG 2.6*  --   --   --  2.0 2.1  PHOS 4.0 3.3  --   --   --   --     GFR: Estimated Creatinine Clearance: 10.6 mL/min (A) (by C-G formula based on SCr of 5.95 mg/dL (H)).  Liver Function Tests:  Recent Labs Lab 06/14/16 1543 06/15/16 1057 06/16/16 0500  AST 94* 34  --   ALT 39 25  --   ALKPHOS 209* 176*  --   BILITOT 1.2 0.9  --   PROT 7.3 6.5  --   ALBUMIN 3.1* 2.7*  2.6* 2.5*    Coagulation Profile:  Recent Labs Lab 06/14/16 1543 06/16/16 0912  INR 1.35 1.21    Cardiac Enzymes:  Recent Labs Lab 06/14/16 1543 06/14/16 2041  TROPONINI 0.08* 0.19*    CBG:  Recent Labs Lab 06/14/16 2003 06/14/16 2327 06/15/16 0323 06/15/16 0812 06/15/16 1144  GLUCAP 139* 102* 101* 104* 95    Recent Results (from the past 240 hour(s))  MRSA PCR Screening     Status: None   Collection Time: 06/14/16  2:15 PM  Result Value Ref Range Status   MRSA by PCR NEGATIVE NEGATIVE Final    Comment:        The GeneXpert MRSA Assay (FDA approved for NASAL specimens only), is one component of a comprehensive MRSA colonization surveillance program. It is not intended to diagnose MRSA infection nor to guide or monitor treatment for MRSA infections.      Scheduled Meds: . aspirin EC  81 mg Oral Daily  . feeding supplement (NEPRO CARB STEADY)  237 mL Oral BID BM  . heparin subcutaneous  5,000 Units Subcutaneous Q8H  . isosorbide dinitrate  10 mg Oral TID  . metoprolol succinate  12.5 mg Oral Daily  . pantoprazole  40 mg Oral Daily  . rosuvastatin  10 mg Oral q1800  . sodium chloride flush  3 mL Intravenous Q12H    LOS: 5 days   Lonia BloodJeffrey T. McClung, MD [facility] Office  [phone] Pager - Text Page per Amion as per below:  On-Call/Text Page:      [email or web address]      password [email or web address]  If 7PM-7AM, please contact night-coverage [email or web address] Password [email or web address] 06/19/2016, 1:40 PM

## 2016-06-19 NOTE — Progress Notes (Signed)
Progress Note  Patient Name: Jonathan Terry Date of Encounter: 06/19/2016  Primary Cardiologist: [facility]  Subjective   No chest pain going to dialysis did not wear CPAP  Inpatient Medications    Scheduled Meds: . aspirin EC  81 mg Oral Daily  . feeding supplement (NEPRO CARB STEADY)  237 mL Oral BID BM  . heparin subcutaneous  5,000 Units Subcutaneous Q8H  . metoprolol succinate  12.5 mg Oral Daily  . pantoprazole (PROTONIX) IV  40 mg Intravenous Q24H  . rosuvastatin  10 mg Oral q1800  . sodium chloride flush  3 mL Intravenous Q12H   Continuous Infusions: . sodium chloride Stopped (06/17/16 2215)   PRN Meds: sodium chloride, acetaminophen, bisacodyl, hydrALAZINE, oxyCODONE, sodium chloride flush   Vital Signs    Vitals:   06/19/16 0142 06/19/16 0530 06/19/16 0542 06/19/16 0604  BP: (!) 157/72  (!) 182/76 (!) 168/81  Pulse: 74  73 73  Resp:      Temp:      TempSrc:      SpO2: 97%  98% 98%  Weight:  138 lb 14.4 oz (63 kg)    Height:        Intake/Output Summary (Last 24 hours) at 06/19/16 0821 Last data filed at 06/19/16 9147  Gross per 24 hour  Intake              255 ml  Output                0 ml  Net              255 ml   Filed Weights   06/17/16 1205 06/17/16 1300 06/19/16 0530  Weight: 134 lb 0.6 oz (60.8 kg) 136 lb 7.4 oz (61.9 kg) 138 lb 14.4 oz (63 kg)    Telemetry    Normal sinus rhythm and sinus bradycardia - Personally Reviewed  ECG    No new tracing - Personally Reviewed  Physical Exam  Affect appropriate Healthy:  appears stated age HEENT: normal Neck supple with no adenopathy JVP normal no bruits no thyromegaly Lungs clear with no wheezing and good diaphragmatic motion Heart:  S1/S2 S4 SEM  PMI normal Abdomen: benighn, BS positve, no tenderness, no AAA no bruit.  No HSM or HJR Distal pulses intact with no bruits No edema Neuro non-focal Skin warm and dry No muscular weakness   Labs    Chemistry Recent Labs Lab  06/14/16 1543  06/15/16 1057 06/16/16 0500 06/17/16 0407 06/18/16 0401 06/19/16 0417  NA 137  < > 139 136 134* 134* 135  K 5.3*  < > 5.1 4.2 4.1 3.7 4.7  CL 97*  < > 98* 94* 94* 96* 96*  CO2 25  < > 28 28 26 28 27   GLUCOSE 136*  < > 99 122* 115* 90 89  BUN 56*  < > 68* 44* 53* 26* 38*  CREATININE 7.22*  < > 7.89* 5.76* 6.92* 4.74* 5.95*  CALCIUM 8.4*  < > 8.2* 8.0* 8.2* 8.0* 8.4*  PROT 7.3  --  6.5  --   --   --   --   ALBUMIN 3.1*  --  2.7*  2.6* 2.5*  --   --   --   AST 94*  --  34  --   --   --   --   ALT 39  --  25  --   --   --   --   [facility]  209*  --  176*  --   --   --   --   BILITOT 1.2  --  0.9  --   --   --   --   GFRNONAA 7*  < > 6* 9* 8* 12* 9*  GFRAA 8*  < > 7* 11* 9* 14* 10*  ANIONGAP 15  < > 13 14 14 10 12   < > = values in this interval not displayed.   Hematology  Recent Labs Lab 06/16/16 0500 06/17/16 0830 06/18/16 0401  WBC 9.2 10.2 7.5  RBC 3.47* 3.47* 3.55*  HGB 9.8* 9.2* 9.9*  HCT 30.1* 28.8* 30.4*  MCV 86.7 83.0 85.6  MCH 28.2 26.5 27.9  MCHC 32.6 31.9 32.6  RDW 17.0* 16.4* 16.6*  PLT 224 194 227    Cardiac Enzymes  Recent Labs Lab 06/14/16 1543 06/14/16 2041  TROPONINI 0.08* 0.19*     Recent Labs Lab 06/14/16 1130  TROPIPOC 0.02     BNPNo results for input(s): BNP, PROBNP in the last 168 hours.   DDimer No results for input(s): DDIMER in the last 168 hours.   Radiology    No results found.  Cardiac Studies   Echocardiogram documented RV systolic pressure of 87 mmHg this admission. LVEF 40-45% with moderate LVH.  Patient Profile     61 y.o. male with PMH of ESRD on HD, Pulmonary HTN, GERD, HTN, anemia, CVA and Tobacco/ETOH use who presented after a cardiac arrest. No prior history of coronary disease, recent low risk myocardial perfusion study and prior documentation of normal EF (late 2017). Recent documentation of severe pulmonary hypertension by echo and right heart catheterization in late 2017, magnitude  uncertain.  Assessment & Plan    1. PEA/bradycardia asystolic cardiac arrest, Cath with only small PDA disease that is flow limiting cath films  Reviewed Per CM and Dr Katrinka BlazingSmith medical Rx continue ASA and beta blocker  .  2. ?Chronic combined systolic and diastolic heart failure . EF 40-45% fluid controlled with dialysis  3. End stage kidney disease on dialysis,  getting dialysis  4. Severe pulmonary hypertension,   Add nitrates  5. 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  D/C home per primary service and renal   Signed, Charlton HawsPeter Nishan, MD  06/19/2016, 8:21 AM

## 2016-06-19 NOTE — Progress Notes (Addendum)
Admit: 06/14/2016 LOS: 5  56M ESRD with cardiac arrest 6/4  Subjective: no c/o today, on dialysis, fully oriented , says he is a bit weak, but says "I'll be better tomorrow".   06/08 0701 - 06/09 0700 In: 255 [P.O.:237; I.V.:18] Out: -   Filed Weights   06/17/16 1300 06/19/16 0530 06/19/16 0725  Weight: 61.9 kg (136 lb 7.4 oz) 63 kg (138 lb 14.4 oz) 62.6 kg (138 lb 0.1 oz)    Scheduled Meds: . aspirin EC  81 mg Oral Daily  . feeding supplement (NEPRO CARB STEADY)  237 mL Oral BID BM  . heparin subcutaneous  5,000 Units Subcutaneous Q8H  . isosorbide dinitrate  10 mg Oral TID  . metoprolol succinate  12.5 mg Oral Daily  . pantoprazole  40 mg Oral Daily  . rosuvastatin  10 mg Oral q1800  . sodium chloride flush  3 mL Intravenous Q12H   Continuous Infusions: . sodium chloride Stopped (06/17/16 2215)   PRN Meds:.sodium chloride, acetaminophen, bisacodyl, hydrALAZINE, oxyCODONE, sodium chloride flush  Current Labs: reviewed    Physical Exam:  Blood pressure 136/65, pulse 69, temperature 98 F (36.7 C), temperature source Oral, resp. rate 16, height 5\' 4"  (1.626 m), weight 62.6 kg (138 lb 0.1 oz), SpO2 98 %. awake, alert RRR Coarse bs b/o No LEE +B/T of AVF S/ntn/d  Dialysis: [facility]  on MWF 4h  60kg  2/2 bath  LUA AVF  Hep none Calcitriol 0.75 mcg IV/HD , Mircera 75mcg q 4wks  (last given 05/28/16)   Other  OP labs = hgb 11.1 06/09/16  pth 314   Ca 9.3  Phos  2.9   A 1. ESRD MWF [place] LUE AVF. HD today, UF 2L to dry wt 2. 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 Hyperkalemia, resolved 4. HTN, improved this AM 5. Anemia, stable on outpt ESA 6. CKD BMD, Ca and P at goal 7. Pulm HTN  P  HD today off schedule , ok for dc from renal standpoint  Vinson Moselleob Azan Maneri MD [facility] pgr [phone]   06/20/2016, 10:59 AM       Recent Labs Lab 06/15/16 1057 06/16/16 0500  06/17/16 0407 06/18/16 0401 06/19/16 0417  NA 139 136 134* 134* 135  K 5.1 4.2 4.1 3.7 4.7  CL 98* 94* 94* 96* 96*  CO2 28 28 26 28 27   GLUCOSE 99 122* 115* 90 89  BUN 68* 44* 53* 26* 38*  CREATININE 7.89* 5.76* 6.92* 4.74* 5.95*  CALCIUM 8.2* 8.0* 8.2* 8.0* 8.4*  PHOS 4.0 3.3  --   --   --     Recent Labs Lab 06/14/16 1543  06/16/16 0500 06/17/16 0830 06/18/16 0401  WBC 11.8*  < > 9.2 10.2 7.5  NEUTROABS 10.2*  --   --   --   --   HGB 11.7*  < > 9.8* 9.2* 9.9*  HCT 34.3*  < > 30.1* 28.8* 30.4*  MCV 87.5  < > 86.7 83.0 85.6  PLT 215  < > 224 194 227  < > = values in this interval not displayed.          d

## 2016-06-19 NOTE — Progress Notes (Signed)
PCCM Progress Note  Admission date: 06/14/2016 Referring provider: Dr. Lynelle Doctor, ER  CC: Short of breath.  HPI: 61 yo with dyspnea, dizziness leading to PEA cardiac arrest with 25 minutes for ROSC.  Hx of ESRD, pulmonary HTN.  Subjective: Pt denies acute complaints.  Reports improved soreness in chest from CPR.    Vital signs: BP (!) 160/80   Pulse 68   Temp 98 F (36.7 C) (Oral)   Resp 13   Ht 5\' 4"  (1.626 m)   Wt 138 lb 0.1 oz (62.6 kg)   SpO2 98%   BMI 23.69 kg/m   Intake/output: I/O last 3 completed shifts: In: 656.5 [P.O.:477; I.V.:179.5] Out: 0   General: thin, adult male in NAD, lying in bed HEENT: MM pink/moist, no jvd PSY: calm/appropriate Neuro: AAOx4, speech clear, MAE CV: s1s2 rrr, no m/r/g PULM: even/non-labored, lungs bilaterally clear UV:OZDG, non-tender, bsx4 active  Extremities: warm/dry, no edema  Skin: no rashes or lesions, bruising over R eye     CMP Latest Ref Rng & Units 06/19/2016 06/18/2016 06/17/2016  Glucose 65 - 99 mg/dL 89 90 644(I)  BUN 6 - 20 mg/dL 34(V) 42(V) 95(G)  Creatinine 0.61 - 1.24 mg/dL 3.87(F) 6.43(P) 2.95(J)  Sodium 135 - 145 mmol/L 135 134(L) 134(L)  Potassium 3.5 - 5.1 mmol/L 4.7 3.7 4.1  Chloride 101 - 111 mmol/L 96(L) 96(L) 94(L)  CO2 22 - 32 mmol/L 27 28 26   Calcium 8.9 - 10.3 mg/dL 8.8(C) 1.6(S) 0.6(T)  Total Protein 6.5 - 8.1 g/dL - - -  Total Bilirubin 0.3 - 1.2 mg/dL - - -  Alkaline Phos 38 - 126 U/L - - -  AST 15 - 41 U/L - - -  ALT 17 - 63 U/L - - -     CBC Latest Ref Rng & Units 06/18/2016 06/17/2016 06/16/2016  WBC 4.0 - 10.5 K/uL 7.5 10.2 9.2  Hemoglobin 13.0 - 17.0 g/dL 0.1(S) 0.1(U) 9.3(A)  Hematocrit 39.0 - 52.0 % 30.4(L) 28.8(L) 30.1(L)  Platelets 150 - 400 K/uL 227 194 224     ABG    Component Value Date/Time   PHART 7.519 (H) 06/17/2016 1515   PCO2ART 36.9 06/17/2016 1515   PO2ART 115.0 (H) 06/17/2016 1515   HCO3 31.4 (H) 06/17/2016 1529   TCO2 33 06/17/2016 1529   O2SAT 75.0 06/17/2016 1529      CBG (last 3)  No results for input(s): GLUCAP in the last 72 hours.   Imaging: No results found.   Studies: CT head 6/04 >> old Lt posterior lenticular nucleus CVA, atrophy CT angio chest 6/04 >> mod Rt and trace Lt pleural effusion, CM, ascites, dilated PA, ATX LLL, atherosclerosis EEG 6/04 >> diffuse suppression and slowing Echo 6/04 >> EF 40 to 45%, grade 1 DD, mild MR, severe LA dilation, mod RV dilation, mod TR, PAS 87 mmHg Right / Left Heart Cath 6/7 >> triple vessel CAD, moderately severe pulmonary hypertension   Lines/tubes: ETT 6/04 >> 6/05  Events: 6/04  Admit, cardiology and renal consulted; myoclonus >> neuro consulte 6/05  Awake, following commands, extubated, keppra d/ced and neuro s/o 6/06  To [facility], PCCM s/o 6/09  PCCM called back to evaluate for pulmonary HTN  Autoimmune 6/9: HIV >> ANCA >> RF >> ANA >>   Summary: 61 yo with PEA cardiac arrest with 25 minutes for ROSC.  Mostly likely related to acute hypoxia in setting of volume overload with hx of ESRD and pulmonary HTN.  Assessment/plan:  Acute respiratory failure  in setting of PEA cardiac arrest. - resolved, supportive care  Possible sleep apnea. - will need outpatient sleep study  PEA cardiac arrest. Moderately Severe Pulmonary HTN - noted on RHC 6/7, PA 70/23, likely combination of Group 2, 3 Hx of HTN, pulmonary HTN, CVA. Atherosclerosis noted on CT chest. - follow up on autoimmune panel as previously ordered  - if autoimmune panel negative, primary goal of therapy will be directed at optimization of underlying chronic illnesses (CHF, COPD, OSA, ESRD).    Suspected COPD - smoked since age 61, up to 3ppd, worked in [employer].  Quit smoking 2013. - monitor for wheezing / evidence of acute exacerbation - consider outpatient PFT's at discharge   ESRD on HD M,W, F. - HD per Nephrology  Acute anoxic encephalopathy with transient myoclonus 6/04. - Resolved  Anemia of chronic  disease. - per primary   DVT prophylaxis - SQ heparin SUP - protonix Nutrition - renal diet Goals of care - full code  Attending MD to follow.    Canary BrimBrandi Ollis, NP-C [facility] Pgr: [phone] or if no answer [phone] 06/19/2016, 11:03 AM  Attending Note:  I have examined patient, reviewed labs, studies and notes. I have discussed the case with B Ollis, and I agree with the data and plans as amended above. 61 yo man with ESRD, HTN and dCHF, COPD, survived PEA arrest and 25 minutes of CPR.He underwent L and R heart cath 6/7 that showed significant PAH. I don't see a PAOP but his transduced systemic pressures were in 190's systolic, consistent with a contribution of L- sided disease. Also suspect that his volume shifts due to HD, his COPD are contributors. Also consider possible OSA as a contributor. He has an auto-immune panel pending, will follow these results. I agree that he needs a sleep study as an outpt. Will need tight BP control, Volume optimization (complicated with ESRD), walking oximetry to insure he does not have occult desaturations. If so >> start O2. No role for directed PAH meds at this time, but may consider as we go forward.     Levy Pupaobert Byrum, MD, PhD 06/19/2016, 3:57 PM [facility] [phone] or if no answer [phone]

## 2016-06-20 LAB — COMPREHENSIVE METABOLIC PANEL
ALK PHOS: 144 U/L — AB (ref 38–126)
ALT: 14 U/L — AB (ref 17–63)
ANION GAP: 10 (ref 5–15)
AST: 24 U/L (ref 15–41)
Albumin: 2.6 g/dL — ABNORMAL LOW (ref 3.5–5.0)
BUN: 25 mg/dL — ABNORMAL HIGH (ref 6–20)
CALCIUM: 8.2 mg/dL — AB (ref 8.9–10.3)
CO2: 29 mmol/L (ref 22–32)
Chloride: 94 mmol/L — ABNORMAL LOW (ref 101–111)
Creatinine, Ser: 4.86 mg/dL — ABNORMAL HIGH (ref 0.61–1.24)
GFR calc Af Amer: 13 mL/min — ABNORMAL LOW (ref >60)
GFR calc non Af Amer: 12 mL/min — ABNORMAL LOW (ref >60)
Glucose, Bld: 117 mg/dL — ABNORMAL HIGH (ref 65–99)
Potassium: 3.8 mmol/L (ref 3.5–5.1)
SODIUM: 133 mmol/L — AB (ref 135–145)
Total Bilirubin: 0.8 mg/dL (ref 0.3–1.2)
Total Protein: 6.3 g/dL — ABNORMAL LOW (ref 6.5–8.1)

## 2016-06-20 LAB — RHEUMATOID FACTOR

## 2016-06-20 LAB — CBC
HCT: 26.8 % — ABNORMAL LOW (ref 39.0–52.0)
HEMOGLOBIN: 8.9 g/dL — AB (ref 13.0–17.0)
MCH: 28.6 pg (ref 26.0–34.0)
MCHC: 33.2 g/dL (ref 30.0–36.0)
MCV: 86.2 fL (ref 78.0–100.0)
Platelets: 232 10*3/uL (ref 150–400)
RBC: 3.11 MIL/uL — AB (ref 4.22–5.81)
RDW: 16.9 % — ABNORMAL HIGH (ref 11.5–15.5)
WBC: 8.6 10*3/uL (ref 4.0–10.5)

## 2016-06-20 MED ORDER — ASPIRIN 81 MG PO TBEC
81.0000 mg | DELAYED_RELEASE_TABLET | Freq: Every day | ORAL | Status: AC
Start: 2016-06-21 — End: ?

## 2016-06-20 MED ORDER — METOPROLOL SUCCINATE ER 25 MG PO TB24: 25.0000 mg | ORAL_TABLET | Freq: Every day | ORAL | 0 refills | Status: DC

## 2016-06-20 MED ORDER — ISOSORBIDE DINITRATE 10 MG PO TABS: 10.0000 mg | ORAL_TABLET | Freq: Three times a day (TID) | ORAL | 0 refills | Status: AC

## 2016-06-20 MED ORDER — HYDRALAZINE HCL 25 MG PO TABS: 25.0000 mg | ORAL_TABLET | Freq: Three times a day (TID) | ORAL | 0 refills | Status: AC

## 2016-06-20 MED ORDER — HYDRALAZINE HCL 25 MG PO TABS
25.0000 mg | ORAL_TABLET | Freq: Three times a day (TID) | ORAL | Status: DC
  Administered 2016-06-20 (×2): 25 mg via ORAL
  Filled 2016-06-20 (×2): qty 1

## 2016-06-20 NOTE — Progress Notes (Signed)
Progress Note  Patient Name: Jonathan Terry Date of Encounter: 06/20/2016  Primary Cardiologist: [facility]. Smith  Subjective   NO chest pain   Inpatient Medications    Scheduled Meds: . aspirin EC  81 mg Oral Daily  . feeding supplement (NEPRO CARB STEADY)  237 mL Oral BID BM  . heparin subcutaneous  5,000 Units Subcutaneous Q8H  . isosorbide dinitrate  10 mg Oral TID  . metoprolol succinate  25 mg Oral Daily  . pantoprazole  40 mg Oral Daily  . rosuvastatin  10 mg Oral q1800   Continuous Infusions:  PRN Meds: acetaminophen, bisacodyl, hydrALAZINE, oxyCODONE   Vital Signs    Vitals:   06/20/16 0004 06/20/16 0417 06/20/16 0751 06/20/16 0833  BP: (!) 167/77 (!) 154/78 (!) 183/84   Pulse: 80 81 80 84  Resp: 16 16 18    Temp: 99.7 F (37.6 C) 99.1 F (37.3 C) 99.2 F (37.3 C)   TempSrc: Oral Oral Oral   SpO2: 98% 97% 99%   Weight:  60.3 kg (133 lb)    Height:        Intake/Output Summary (Last 24 hours) at 06/20/16 0858 Last data filed at 06/19/16 1700  Gross per 24 hour  Intake                0 ml  Output             3001 ml  Net            -3001 ml   Filed Weights   06/19/16 0725 06/19/16 1055 06/20/16 0417  Weight: 62.6 kg (138 lb 0.1 oz) 59.5 kg (131 lb 2.8 oz) 60.3 kg (133 lb)    Telemetry    Normal sinus rhythm and sinus bradycardia - Personally Reviewed  ECG     SR IVCD LVH no acute ST changes 06/18/16 - Personally Reviewed  Physical Exam  Affect appropriate Healthy:  appears stated age HEENT: normal Neck supple with no adenopathy JVP normal no bruits no thyromegaly Lungs clear with no wheezing and good diaphragmatic motion Heart:  S1/S2 S4 SEM  PMI normal Abdomen: benighn, BS positve, no tenderness, no AAA no bruit.  No HSM or HJR Distal pulses intact with no bruits No edema Neuro non-focal Skin warm and dry No muscular weakness   Labs    Chemistry Recent Labs Lab 06/14/16 1543  06/15/16 1057 06/16/16 0500  06/18/16 0401  06/19/16 0417 06/20/16 0444  NA 137  < > 139 136  < > 134* 135 133*  K 5.3*  < > 5.1 4.2  < > 3.7 4.7 3.8  CL 97*  < > 98* 94*  < > 96* 96* 94*  CO2 25  < > 28 28  < > 28 27 29   GLUCOSE 136*  < > 99 122*  < > 90 89 117*  BUN 56*  < > 68* 44*  < > 26* 38* 25*  CREATININE 7.22*  < > 7.89* 5.76*  < > 4.74* 5.95* 4.86*  CALCIUM 8.4*  < > 8.2* 8.0*  < > 8.0* 8.4* 8.2*  PROT 7.3  --  6.5  --   --   --   --  6.3*  ALBUMIN 3.1*  --  2.7*  2.6* 2.5*  --   --   --  2.6*  AST 94*  --  34  --   --   --   --  24  ALT 39  --  25  --   --   --   --  14*  ALKPHOS 209*  --  176*  --   --   --   --  144*  BILITOT 1.2  --  0.9  --   --   --   --  0.8  GFRNONAA 7*  < > 6* 9*  < > 12* 9* 12*  GFRAA 8*  < > 7* 11*  < > 14* 10* 13*  ANIONGAP 15  < > 13 14  < > 10 12 10   < > = values in this interval not displayed.   Hematology  Recent Labs Lab 06/17/16 0830 06/18/16 0401 06/20/16 0444  WBC 10.2 7.5 8.6  RBC 3.47* 3.55* 3.11*  HGB 9.2* 9.9* 8.9*  HCT 28.8* 30.4* 26.8*  MCV 83.0 85.6 86.2  MCH 26.5 27.9 28.6  MCHC 31.9 32.6 33.2  RDW 16.4* 16.6* 16.9*  PLT 194 227 232    Cardiac Enzymes  Recent Labs Lab 06/14/16 1543 06/14/16 2041  TROPONINI 0.08* 0.19*     Recent Labs Lab 06/14/16 1130  TROPIPOC 0.02     BNPNo results for input(s): BNP, PROBNP in the last 168 hours.   DDimer No results for input(s): DDIMER in the last 168 hours.   Radiology    No results found.  Cardiac Studies   Echocardiogram documented RV systolic pressure of 87 mmHg this admission. LVEF 40-45% with moderate LVH.  Patient Profile     61 y.o. male with PMH of ESRD on HD, Pulmonary HTN, GERD, HTN, anemia, CVA and Tobacco/ETOH use who presented after a cardiac arrest. No prior history of coronary disease, recent low risk myocardial perfusion study and prior documentation of normal EF (late 2017). Recent documentation of severe pulmonary hypertension by echo and right heart catheterization in late 2017,  magnitude uncertain.  Assessment & Plan    1. PEA/bradycardia asystolic cardiac arrest, Cath with only small PDA disease that is flow limiting cath films  Reviewed Per CM and Dr Katrinka Blazing medical Rx continue ASA and beta blocker  .  2. ?Chronic combined systolic and diastolic heart failure . EF 40-45% fluid controlled with dialysis  3. End stage kidney disease on dialysis,  per renal  4. Severe pulmonary hypertension,   Nitrates added Seen by Dr Delton Coombes Autoimmune panel will Need outpatient sleep study f/u with pulmonary  5. 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 6. HtN:  Still elevated add hyralazine q 8 hours   D/C home per primary service and renal   Signed, Charlton Haws, MD  06/20/2016, 8:58 AM

## 2016-06-20 NOTE — Progress Notes (Signed)
Patient ambulated approximately 240 ft with a standby assist. Patient ambulated on room air and was able to maintain oxygen saturations >94% at all times. Patient denied shortness of breath; only stated that he felt "a little weak."

## 2016-06-20 NOTE — Care Management Note (Signed)
Case Management Note  Patient Details  Name: Jonathan Terry MRN: [id number] Date of Birth: [birth year]  Subjective/Objective:                 Spoke with patient at the bedside. [facility] walking in room packing his bag while we talked). Patient w order to DC to home. No other CM needs identified.    Action/Plan:  DC to home.   Expected Discharge Date:  06/20/16               Expected Discharge Plan:  Home/Self Care  In-House Referral:     Discharge planning Services  CM Consult  Post Acute Care Choice:    Choice offered to:     DME Arranged:    DME Agency:     HH Arranged:    HH Agency:     Status of Service:  Completed, signed off  If discussed at [employer] Length of Stay Meetings, dates discussed:    Additional Comments:  Lawerance SabalDebbie Angelita Harnack, RN 06/20/2016, 12:00 PM

## 2016-06-20 NOTE — Discharge Instructions (Signed)
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 Esta es una enfermedad de las clulas del msculo cardaco del lado derecho del corazn. El corazn tiene Woodssidedos cavidades que bombean la sangre, Oregonel ventrculo derecho y el ventrculo izquierdo. La displasia arritmognica del ventrculo derecho afecta generalmente el ventrculo derecho. A medida que esta enfermedad empeora con el tiempo, la cavidad que bombea la sangre se debilita y se produce un ritmo cardaco anormal (arritmias). Esto puede producir un ataque al corazn. A veces, un ataque al corazn es Financial risk analystel primer signo de esta enfermedad. CAUSAS  La displasia arritmognica del ventrculo derecho es una enfermedad heredada. Es un problema hereditario. Algunas personas nacen con genes anormales (mutaciones genticas) que pueden causar esta enfermedad. Se han asociado muchas mutaciones genticas con esta enfermedad. Los defectos genticos que producen la displasia arritmognica del ventrculo derecho afectan las estructuras que sostienen el msculo cardaco unido. Esta enfermedad impide que estas estructuras funcionen adecuadamente. El msculo cardaco comienza a ser reemplazado por grasa y tejido cicatricial. Con el tiempo, el ventrculo derecho se estira y no logra bombear Writerla sangre correctamente. FACTORES DE RIESGO  Los siguiente factores pueden aumentar el riesgo de desarrollar displasia arritmognica del ventrculo derecho:  Tener un padre, Gean Maidensuna madre, un hermano o una hermana a quien le hayan diagnosticado esta enfermedad.  Tener un familiar cercano que haya muerto en forma sbita e inexplicable antes de los 40aos. SIGNOS Y SNTOMAS  Esta enfermedad puede presentarse a lo largo de un perodo prolongado sin causar sntomas. Los nios raramente presentan signos o sntomas. Los signos y sntomas tempranos ms frecuentes  son:  Ataque al corazn imprevisto en una persona joven mientras hace ejercicio.  Latidos cardacos rpidos o irregulares, latidos cardacos salteados (palpitaciones).  Mareos.  Desmayos.  Dolor en el pecho.  Falta de aire. Los signos y sntomas de la etapa tarda de esta enfermedad pueden ser:  Creciente dificultad para respirar.  Fatiga.  Hinchazn en las piernas. DIAGNSTICO  Su mdico podra sospechar que tiene displasia arritmognica del ventrculo derecho si tiene antecedentes familiares de esta enfermedad o tiene un familiar cercano que muri en forma temprana e inesperada. Su mdico evaluar los sntomas y le har un examen fsico. Pueden hacerle varios estudios para ayudar con el diagnstico. Estos pueden ser:  Verificar si tiene arritmia, tpica de esta enfermedad. Esto puede ser difcil porque las arritmias aparecen y Electronics engineerdesaparecen.  Electrocardiograma (ECG). Es un trazado del ritmo cardaco. Es posible que le hagan varios ECG. ? Pueden hacerle un ECG mientras hace ejercicio (prueba de esfuerzo). ? Tambin pueden hacerle un ECG que se registra durante 24horas mientras [place] una mquina de ECG porttil (monitor Holter).  Ecocardiograma. Este estudio crea una imagen del corazn mediante ondas sonoras y [place] computadora. Sirve para mostrar si el corazn tiene una estructura anormal.  RM cardaca para generar una imagen detallada del corazn. Este es probablemente el mejor estudio de diagnstico por imgenes para diagnosticar la displasia arritmognica del ventrculo derecho.  Prueba gentica para saber si tiene las mutaciones genticas asociadas con esta enfermedad. TRATAMIENTO  No existe una cura para la displasia arritmognica del ventrculo derecho. El tratamiento puede prevenir las arritmias y la insuficiencia cardaca. Un dispositivo elctrico implantado, como un marcapasos, podra prevenir la muerte a causa de una arritmia que detiene el corazn. Este dispositivo se llama  desfibrilador cardioversor implantable. Si el corazn late a un ritmo peligroso, English as a second language teachereste dispositivo le da un  choque elctrico al corazn para que vuelva a latir normalmente. Otros tratamientos pueden ser:  Betabloqueantes y otros medicamentos para prevenir la arritmia.  Medicamentos para fortalecer la capacidad del corazn para bombear la sangre (digoxina), abrir los vasos sanguneos y reducir la cantidad de lquidos en el organismo. De esta manera el corazn no necesita esforzarse tanto. INSTRUCCIONES PARA EL CUIDADO EN EL HOGAR  Siga todas las indicaciones del mdico.  Tome todos los medicamentos como le indic el mdico.No tome ningn otro medicamento, incluidos los de [place], sin antes Science writer a su mdico.  No fume.  No consuma alcohol en forma excesiva ni drogas.  No participe en deportes competitivos ni haga ejercicios intensos.  No use cafena ni otros estimulantes.  Concurra a todas las visitas de control. SOLICITE ATENCIN MDICA SI:   Se siente fatigado.  Tiene hinchazn en las piernas. SOLICITE ATENCIN MDICA DE INMEDIATO SI:   Tiene palpitaciones o latidos cardacos rpidos.  Sufre mareos o se desmaya.  Tiene dificultad para respirar.  Siente dolor en el pecho. Esta informacin no tiene Theme park manager el consejo del mdico. Asegrese de hacerle al mdico cualquier pregunta que tenga. Document Released: 01/02/2013 Elsevier Interactive Patient Education  2017 Elsevier Inc.  Hipertensin pulmonar (Pulmonary Hypertension)  La hipertensin pulmonar es la hipertensin arterial en las arterias de los pulmones (arterias pulmonares). Es distinto de Warehouse manager hipertensin arterial en cualquier otra parte del cuerpo, como la tensin arterial que se mide con Pensions consultant. La hipertensin pulmonar dificulta la circulacin de la sangre a travs de los pulmones. Como resultado, el corazn debe trabajar ms para bombear sangre a travs de los pulmones, y Industrial/product designer puede  ser ms difcil. Con el tiempo, esto puede debilitar el msculo cardaco. La hipertensin pulmonar es una afeccin grave que puede ser fatal. CAUSAS Muchas enfermedades diferentes pueden causar hipertensin pulmonar. La hipertensin pulmonar puede clasificarse en cinco grupos segn la causa: Grupo1 Hipertensin pulmonar causada por un crecimiento anormal de pequeos vasos sanguneos en los pulmones (hipertensin arterial pulmonar). El crecimiento anormal de vasos sanguneos puede tener causas desconocidas o puede deberse a lo siguiente:  Transmisin por parte de uno de los padres (hereditario).  Otra enfermedad, como enfermedades del tejido conjuntico (por ejemplo, lupus o esclerodermia) o VIH.  Ciertas drogas o toxinas. Grupo2 Hipertensin pulmonar causada por debilidad de la cmara principal del corazn (ventrculo izquierdo) o por una enfermedad en las vlvulas cardacas. Grupo3 Hipertensin pulmonar causada por enfermedad pulmonar o niveles bajos de oxgeno. Las causas de este grupo incluyen:  Enfisema o enfermedad pulmonar obstructiva crnica (EPOC).  Apnea del sueo sin tratar.  Fibrosis pulmonar. Grupo4 Hipertensin pulmonar causada por cogulos sanguneos en los pulmones (embolia pulmonar). Grupo5 Otras causas de hipertensin pulmonar, como anemia drepanoctica o una combinacin de varias causas. SNTOMAS Los sntomas de esta afeccin incluyen lo siguiente:  Falta de aire. Puede notar que le falta el aire: ? Al realizar [employer], como caminar. ? Falta de [place].  Cansancio o fatiga.  Mareos o [employer].  Latidos cardacos rpidos o sensacin de que el corazn aletea o da un salto (palpitaciones).  Agrandamiento de las venas del cuello.  Color azulado en los labios y en las puntas de los dedos. DIAGNSTICO Esta afeccin se puede diagnosticar a travs de lo siguiente:  Radiografa de trax.  Gases en la sangre arterial. Este anlisis determina la acidez  de la [place], as [employer] de oxgeno y dixido de carbono en la sangre.  Tomografa computarizada. Esta prueba  puede brindar imgenes detalladas de los pulmones.  Pruebas de la funcin pulmonar. Esta prueba mide la cantidad de aire que pueden [employer]. Tambin controla cmo entra y sale el aire de los pulmones.  Electrocardiograma (ECG). Esta prueba registra la actividad elctrica del corazn.  Ecocardiograma. Esta prueba se [place] para observar el corazn en movimiento y ver cmo est funcionando.  Cateterismo cardaco. Esta prueba puede medir la presin en la arteria pulmonar y en el lado derecho del corazn.  Biopsia de pulmn. En este procedimiento, se analiza [place] de tejido pulmonar para hallar las causas preexistentes. TRATAMIENTO La hipertensin pulmonar no tiene [place], pero el tratamiento puede ayudar a [employer] sntomas y a Paediatric nurse progreso de la enfermedad. El tratamiento puede incluir lo siguiente:  Medicamentos, por ejemplo: ? Medicamentos para la presin arterial. ? Medicamentos para relajar (dilatar) los vasos sanguneos pulmonares. ? Medicamentos para eliminar el exceso de lquido (diurticos). ? Anticoagulantes.  Ciruga. Para la hipertensin pulmonar grave que no responde al tratamiento mdico, puede ser necesario un trasplante cardiopulmonar o de pulmn. INSTRUCCIONES PARA EL CUIDADO EN EL HOGAR  Tome los medicamentos solamente como se lo haya indicado el mdico. Estos incluyen los medicamentos recetados y de [place]. Tome todos los medicamentos exactamente como se le indic. No cambie ni interrumpa los medicamentos sin consultar primero al mdico.  No fume. Si necesita ayuda para dejar de fumar, consulte al mdico.  Consuma una dieta saludable.  Limite el consumo de sal (sodio) a menos de 2300mg  por da.  Permanezca activo todo lo posible. Haga ejercicio segn las indicaciones del mdico. Pregntele al mdico qu ejercicios son  seguros para usted.  Evite los lugares [employer].  Evite saunas y baeras de hidromasajes.  Evite quedar embarazada, si esto se aplica a usted. Hable con el mdico sobre los mtodos anticonceptivos seguros.  Concurra a todas las visitas de control como se lo haya indicado el mdico. Esto es importante. SOLICITE ATENCIN MDICA DE INMEDIATO SI:  Le falta el aire de manera preocupante.  Siente dolor u opresin en el pecho.  Tose y escupe sangre.  Se le hinchan los pies o las piernas.  Aumenta de peso considerablemente en 1 o 2 das. Esta informacin no tiene Theme park manager el consejo del mdico. Asegrese de hacerle al mdico cualquier pregunta que tenga. Document Released: 04/15/2008 Document Revised: 04/21/2015 Document Reviewed: 06/19/2012 Elsevier Interactive Patient Education  [employer].

## 2016-06-20 NOTE — Progress Notes (Signed)
Admit: 06/14/2016 LOS: 6  39M ESRD with cardiac arrest 6/4  Subjective: no c/o today, thinks he is going home  06/09 0701 - 06/10 0700 In: -  Out: 3001 [Stool:1]  Filed Weights   06/19/16 0725 06/19/16 1055 06/20/16 0417  Weight: 62.6 kg (138 lb 0.1 oz) 59.5 kg (131 lb 2.8 oz) 60.3 kg (133 lb)    Scheduled Meds: . aspirin EC  81 mg Oral Daily  . feeding supplement (NEPRO CARB STEADY)  237 mL Oral BID BM  . heparin subcutaneous  5,000 Units Subcutaneous Q8H  . hydrALAZINE  25 mg Oral Q8H  . isosorbide dinitrate  10 mg Oral TID  . metoprolol succinate  25 mg Oral Daily  . pantoprazole  40 mg Oral Daily  . rosuvastatin  10 mg Oral q1800   Continuous Infusions:  PRN Meds:.acetaminophen, bisacodyl, hydrALAZINE, oxyCODONE  Current Labs: reviewed    Physical Exam:  Blood pressure (!) 151/80, pulse 84, temperature 99.2 F (37.3 C), temperature source Oral, resp. rate 18, height 5\' 4"  (1.626 m), weight 60.3 kg (133 lb), SpO2 99 %. awake, alert RRR Coarse bs b/o No LEE +B/T of AVF S/ntn/d  Dialysis: [facility]  on MWF 4h  60kg  2/2 bath  LUA AVF  Hep none Calcitriol 0.75 mcg IV/HD , Mircera 75mcg q 4wks  (last given 05/28/16)   Other  OP labs = hgb 11.1 06/09/16  pth 314   Ca 9.3  Phos  2.9   A 1. ESRD MWF [place] LUE AVF. HD today, UF 2L to dry wt 2. 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 Hyperkalemia, resolved 4. HTN, improved this AM 5. Anemia, stable on outpt ESA 6. CKD BMD, Ca and P at goal 7. Pulm HTN 8. Dispo - Per primary, stable for d/c from renal standpoint  P - as above    Vinson Moselleob Jsoeph Podesta MD [facility] pgr [phone]   06/20/2016, 10:58 AM     Recent Labs Lab 06/15/16 1057 06/16/16 0500  06/18/16 0401 06/19/16 0417 06/20/16 0444  NA 139 136  < > 134* 135 133*  K 5.1 4.2  < > 3.7 4.7 3.8  CL 98* 94*  < > 96* 96* 94*  CO2 28 28  < > 28 27 29   GLUCOSE 99 122*  <  > 90 89 117*  BUN 68* 44*  < > 26* 38* 25*  CREATININE 7.89* 5.76*  < > 4.74* 5.95* 4.86*  CALCIUM 8.2* 8.0*  < > 8.0* 8.4* 8.2*  PHOS 4.0 3.3  --   --   --   --   < > = values in this interval not displayed.  Recent Labs Lab 06/14/16 1543  06/17/16 0830 06/18/16 0401 06/20/16 0444  WBC 11.8*  < > 10.2 7.5 8.6  NEUTROABS 10.2*  --   --   --   --   HGB 11.7*  < > 9.2* 9.9* 8.9*  HCT 34.3*  < > 28.8* 30.4* 26.8*  MCV 87.5  < > 83.0 85.6 86.2  PLT 215  < > 194 227 232  < > = values in this interval not displayed.          d

## 2016-06-20 NOTE — Discharge Summary (Signed)
DISCHARGE SUMMARY  Sahith Nurse  MR#: [id number]  DOB:20-Aug-1955  Date of Admission: 06/14/2016 Date of Discharge: 06/20/2016  Attending Physician:Marialuiza Car T  Patient's PCP:No primary care provider on file. - followed by [facility]  Consults: Pulmonary Cardiology Nephrology  Disposition: D/C home   Follow-up Appts: Follow-up Information    Lyn Records, MD. Schedule an appointment as soon as possible for a visit in 2 week(s).   Specialty:  Cardiology Contact information: [address] [place] [address] [phone]        Leslye Peer, MD. Schedule an appointment as soon as possible for a visit in 2 week(s).   Specialty:  Pulmonary Disease Contact information: [address] [place] [address] [phone]           Tests Needing Follow-up: -autoimmune labs for evaluation of pulmonary hypertension pending at time of discharge -outpatient sleep study needs to be arranged -follow-up of blood pressure control will be indicated with probable need to further titrate medications  Discharge Diagnoses: PEA / bradycardic / asystolic cardiac arrest Acute respiratory failure in setting of PEA cardiac arrest Moderately severe Pulm HTN - multifactorial Acute systolic congestive heart failure post arrest  Possible sleep apnea ESRD on HD M/W/F Acute anoxic encephalopathy with transient myoclonus 6/04 - resolved  HTN Anemia of chronic kidney disease  Initial presentation: 61 yo with a history of ESRD on dialysis, hypertension, chronic anemia, tobacco abuse, and alcohol abuse who was admitted 6/4 with PEA cardiac arrest with 25 minutes for ROSC. Mostly likely related to acute hypoxia in setting of volume overload with hx of ESRD and pulmonary HTN.  Hospital Course:  PEA / bradycardic / asystolic cardiac arrest Care per Cardiology - possibly related to severe pulmonary hypertension - cardiac cath noted 3 vessel  disease, but only severe lesion was in PDA - med tx recommended - not felt to explain his arrest / felt to be incidental findings not likely of any clinical significance   Acute respiratory failure in setting of PEA cardiac arrest resolved - respiratory status is stable at time of d/c w/ sats >90% on RA  Moderately severe Pulm HTN PA pressure 70/23 via cath - Pulm MD feels this is due to a combination of uncontrolled hypertension/left heart disease, volume shift related to dialysis, and COPD with a possible contribution to occult OSA - an autoimmune panel is pending at the time of his discharge - he will need an outpatient sleep study which is to be arranged by pulmonary - tight blood pressure control is indicated  Acute systolic congestive heart failure Care per Cardiology - possibly simply related to above and reversible - volume control per HD - f/u TTE to be considered in outpt setting   Possible sleep apnea Per Pulmonary will need sleep study as outpt  ESRD on HD M/W/F Nephrology following and attending to HD   Acute anoxic encephalopathy with transient myoclonus 6/04 Resolved - mental status has returned to normal prior to discharge  HTN Blood pressure continues to be challenging to control - will need to watch post-dialysis to assure severe hypotension not encountered w/ fluid removal   Anemia of chronic kidney disease Hemoglobin stable at time of discharge  Allergies as of 06/20/2016   No Known Allergies     Medication List    STOP taking these medications   aspirin 325 MG tablet Replaced by:  aspirin 81 MG EC tablet   QUEtiapine 50 MG Tb24 24 hr tablet  Commonly known as:  SEROQUEL XR   traZODone 50 MG tablet Commonly known as:  DESYREL     TAKE these medications   aspirin 81 MG EC tablet Take 1 tablet (81 mg total) by mouth daily. Start taking on:  06/21/2016 Replaces:  aspirin 325 MG tablet   CENTRUM SILVER PO Take 1 tablet by mouth daily.   FIBER  PO Take 1 capsule by mouth 3 (three) times daily.   FLUoxetine 20 MG capsule Commonly known as:  PROZAC Take 20 mg by mouth 2 (two) times daily.   hydrALAZINE 25 MG tablet Commonly known as:  APRESOLINE Take 1 tablet (25 mg total) by mouth every 8 (eight) hours.   ipratropium 17 MCG/ACT inhaler Commonly known as:  ATROVENT HFA Inhale 1 puff into the lungs 2 (two) times daily as needed for wheezing.   ipratropium-albuterol 0.5-2.5 (3) MG/3ML Soln Commonly known as:  DUONEB Take 3 mLs by nebulization every 6 (six) hours as needed (for shortness of breath).   isosorbide dinitrate 10 MG tablet Commonly known as:  ISORDIL Take 1 tablet (10 mg total) by mouth 3 (three) times daily.   metoprolol succinate 25 MG 24 hr tablet Commonly known as:  TOPROL-XL Take 1 tablet (25 mg total) by mouth daily. Start taking on:  06/21/2016   omeprazole 20 MG capsule Commonly known as:  PRILOSEC Take 20 mg by mouth daily.   Oxycodone HCl 10 MG Tabs Take 10 mg by mouth every 6 (six) hours as needed (for pain).   rosuvastatin 10 MG tablet Commonly known as:  CRESTOR Take 10 mg by mouth daily.       Day of Discharge BP (!) 151/80   Pulse 84   Temp 99.2 F (37.3 C) (Oral)   Resp 18   Ht 5\' 4"  (1.626 m)   Wt 60.3 kg (133 lb)   SpO2 99%   BMI 22.83 kg/m   Physical Exam: General: No acute respiratory distress Lungs: Clear to auscultation bilaterally without wheezes or crackles Cardiovascular: Regular rate and rhythm without murmur gallop or rub normal S1 and S2 Abdomen: Nontender, nondistended, soft, bowel sounds positive, no rebound, no ascites, no appreciable mass Extremities: No significant cyanosis, clubbing, or edema bilateral lower extremities  Basic Metabolic Panel:  Recent Labs Lab 06/15/16 1057 06/16/16 0500 06/17/16 0407 06/18/16 0401 06/18/16 0757 06/19/16 0417 06/20/16 0444  NA 139 136 134* 134*  --  135 133*  K 5.1 4.2 4.1 3.7  --  4.7 3.8  CL 98* 94* 94* 96*   --  96* 94*  CO2 28 28 26 28   --  27 29  GLUCOSE 99 122* 115* 90  --  89 117*  BUN 68* 44* 53* 26*  --  38* 25*  CREATININE 7.89* 5.76* 6.92* 4.74*  --  5.95* 4.86*  CALCIUM 8.2* 8.0* 8.2* 8.0*  --  8.4* 8.2*  MG 2.6*  --   --   --  2.0 2.1  --   PHOS 4.0 3.3  --   --   --   --   --     Liver Function Tests:  Recent Labs Lab 06/14/16 1543 06/15/16 1057 06/16/16 0500 06/20/16 0444  AST 94* 34  --  24  ALT 39 25  --  14*  ALKPHOS 209* 176*  --  144*  BILITOT 1.2 0.9  --  0.8  PROT 7.3 6.5  --  6.3*  ALBUMIN 3.1* 2.7*  2.6* 2.5* 2.6*  Coags:  Recent Labs Lab 06/14/16 1543 06/16/16 0912  INR 1.35 1.21    CBC:  Recent Labs Lab 06/14/16 1543 06/15/16 1057 06/16/16 0500 06/17/16 0830 06/18/16 0401 06/20/16 0444  WBC 11.8* 11.7* 9.2 10.2 7.5 8.6  NEUTROABS 10.2*  --   --   --   --   --   HGB 11.7* 10.5* 9.8* 9.2* 9.9* 8.9*  HCT 34.3* 32.3* 30.1* 28.8* 30.4* 26.8*  MCV 87.5 84.3 86.7 83.0 85.6 86.2  PLT 215 218 224 194 227 232    Cardiac Enzymes:  Recent Labs Lab 06/14/16 1543 06/14/16 2041  TROPONINI 0.08* 0.19*    CBG:  Recent Labs Lab 06/14/16 2003 06/14/16 2327 06/15/16 0323 06/15/16 0812 06/15/16 1144  GLUCAP 139* 102* 101* 104* 95    Recent Results (from the past 240 hour(s))  MRSA PCR Screening     Status: None   Collection Time: 06/14/16  2:15 PM  Result Value Ref Range Status   MRSA by PCR NEGATIVE NEGATIVE Final    Comment:        The GeneXpert MRSA Assay (FDA approved for NASAL specimens only), is one component of a comprehensive MRSA colonization surveillance program. It is not intended to diagnose MRSA infection nor to guide or monitor treatment for MRSA infections.      Time spent in discharge (includes decision making & examination of pt): >35 minutes  06/20/2016, 10:51 AM   Lonia BloodJeffrey T. Leviathan Macera, MD [facility] Office  [phone] Pager [phone]  On-Call/Text Page:      [email or web address]      password  [email or web address]

## 2016-06-21 ENCOUNTER — Encounter (HOSPITAL_COMMUNITY): Payer: Self-pay | Admitting: Anesthesiology

## 2016-06-21 LAB — ANA W/REFLEX IF POSITIVE: ANA: NEGATIVE

## 2016-06-21 LAB — ANCA TITERS
Atypical P-ANCA titer: <1:20 {titer}
C-ANCA: <1:20 {titer}
P-ANCA: <1:20 {titer}

## 2016-07-15 ENCOUNTER — Ambulatory Visit (INDEPENDENT_AMBULATORY_CARE_PROVIDER_SITE_OTHER): Payer: Medicare Other | Admitting: Adult Health

## 2016-07-15 ENCOUNTER — Encounter: Payer: Self-pay | Admitting: Adult Health

## 2016-07-15 VITALS — BP 157/67 | HR 62 | Ht 64.0 in | Wt 130.0 lb

## 2016-07-15 DIAGNOSIS — I272 Pulmonary hypertension, unspecified: Secondary | ICD-10-CM

## 2016-07-15 DIAGNOSIS — R0609 Other forms of dyspnea: Secondary | ICD-10-CM | POA: Diagnosis not present

## 2016-07-15 DIAGNOSIS — I509 Heart failure, unspecified: Secondary | ICD-10-CM

## 2016-07-15 DIAGNOSIS — J449 Chronic obstructive pulmonary disease, unspecified: Secondary | ICD-10-CM | POA: Diagnosis not present

## 2016-07-15 DIAGNOSIS — G4733 Obstructive sleep apnea (adult) (pediatric): Secondary | ICD-10-CM | POA: Diagnosis not present

## 2016-07-15 MED ORDER — IPRATROPIUM-ALBUTEROL 0.5-2.5 (3) MG/3ML IN SOLN: 3.0000 mL | Freq: Four times a day (QID) | RESPIRATORY_TRACT | 1 refills | Status: DC | PRN

## 2016-07-15 NOTE — Assessment & Plan Note (Signed)
Autoimmune workup neg.  Check sleep study and PFT  No oxygen needed at this time  Cont w/ b/p control and HD for volume status .

## 2016-07-15 NOTE — Assessment & Plan Note (Signed)
Possible COPD w/ previous smoker  Will need PFT  Duoneb rx to have for As needed  Use.

## 2016-07-15 NOTE — Progress Notes (Signed)
@Patient  ID: Jonathan Terry, male    DOB: February 09, 1955, 61 y.o.   MRN: [id number]  Chief Complaint  Patient presents with  . Follow-up    post hospital     Referring provider: No ref. provider found  HPI: 61 yo hispanic male(moved to [place] in 1985)  former smoker (quit 2013) ( seen for PCCM consult during hospitalization for cardiac arrest 06/2016.  Has ESRD on HD (since 2013)  , CVA, Pulmonary HTN, ETOH (quit 2013)   TEST  CT head 6/04 >> old Lt posterior lenticular nucleus CVA, atrophy CT angio chest 6/04 >> mod Rt and trace Lt pleural effusion, CM, ascites, dilated PA, ATX LLL, atherosclerosis EEG 6/04 >> diffuse suppression and slowing Echo 6/04 >> EF 40 to 45%, grade 1 DD, mild MR, severe LA dilation, mod RV dilation, mod TR, PAS 87 mmHg Right / Left Heart Cath 6/7 >> triple vessel CAD, moderately severe pulmonary hypertension Autoimmune 6/9: HIV >>NR  ANCA >>NEG  RF >>NEG  ANA >> NEG   07/15/2016 Follow up  :Post hospital follow up /Cardiac Arrest/Pulmonary HTN Pt returns for a post hospital follow up . Pt has ESRD on HD , en route to dialysis he has cardiac arrest with CPR/ROSC in . He was seen by cardiology noted to have 3 vessel dz but only severe lesion in PDA. Recommended for medical tx at that time. He was found to have severe pulmonary HTN with PA pressure 70/23 during cath.  Autoimmune workup was neg. Echo showed EF 40-45%, PAS . CTA was neg for PE. Did show bilateral effusions . follow up CXR w/ improvement . He has been recommneded for OP sleep study and PFT . He is a former smoker. He was started on Duoneb As needed  But has not received them yet.  O2 sats on room air today 97%, walk test with O2 sats 88% briefly with rebound quickly >90%. He is not on home oxygen.  He Is accompanied by girlfriend. He speaks english and says he understands.  Has follow up with cards next week.  Since discharge is feeling better, no syncope , chest pain or palpitations  Does  get winded with walking . Is sendentary for the most part due to fatigue and dyspnea.  Used to work in [employer] but unable to work since HD started 5 yrs ago.  Denies cough or wheezing . No LE swelling .     No Known Allergies  Immunization History  Administered Date(s) Administered  . Influenza Split 01/27/2011    Past Medical History:  Diagnosis Date  . Anemia 01/29/2011  . Anemia   . Anginal pain (HCC)    none recently since started dialysis  . Congestive heart failure (HCC) 01/29/2011   none since on dialysis  . CVA (cerebral vascular accident) (HCC)   . Dizziness 606/05/13  . ESRD (end stage renal disease) on dialysis (HCC)   . GERD (gastroesophageal reflux disease)   . Hypertension   . Renal failure (ARF), acute on chronic (HCC)   . Tobacco use     Tobacco History: History  Smoking Status  . Never Smoker  Smokeless Tobacco  . Never Used   Counseling given: Not Answered   Outpatient Encounter Prescriptions as of 07/15/2016  Medication Sig  . aspirin EC 81 MG EC tablet Take 1 tablet (81 mg total) by mouth daily.  Marland Kitchen FIBER PO Take 1 capsule by mouth 3 (three) times daily.  Marland Kitchen FLUoxetine (PROZAC) 20 MG  capsule Take 20 mg by mouth 2 (two) times daily.  . hydrALAZINE (APRESOLINE) 25 MG tablet Take 1 tablet (25 mg total) by mouth every 8 (eight) hours.  Marland Kitchen. ipratropium (ATROVENT HFA) 17 MCG/ACT inhaler Inhale 1 puff into the lungs 2 (two) times daily as needed for wheezing.  Marland Kitchen. ipratropium-albuterol (DUONEB) 0.5-2.5 (3) MG/3ML SOLN Take 3 mLs by nebulization every 6 (six) hours as needed (for shortness of breath).  . isosorbide dinitrate (ISORDIL) 10 MG tablet Take 1 tablet (10 mg total) by mouth 3 (three) times daily.  . metoprolol succinate (TOPROL-XL) 25 MG 24 hr tablet Take 1 tablet (25 mg total) by mouth daily.  . metoprolol tartrate (LOPRESSOR) 25 MG tablet Take 25 mg by mouth at bedtime.  . Multiple Vitamins-Minerals (CENTRUM SILVER PO) Take 1 tablet by mouth  daily.  . Multiple Vitamins-Minerals (CENTRUM SILVER PO) Take 1 tablet by mouth daily.  Marland Kitchen. omeprazole (PRILOSEC) 20 MG capsule Take 20 mg by mouth daily.  . Oxycodone HCl 10 MG TABS Take 10 mg by mouth every 6 (six) hours as needed (for pain).  . rosuvastatin (CRESTOR) 10 MG tablet Take 10 mg by mouth daily.  . traZODone (DESYREL) 50 MG tablet Take 50 mg by mouth at bedtime.  Marland Kitchen. ipratropium-albuterol (DUONEB) 0.5-2.5 (3) MG/3ML SOLN Take 3 mLs by nebulization every 6 (six) hours as needed.   No facility-administered encounter medications on file as of 07/15/2016.      Review of Systems  Constitutional:   No  weight loss, night sweats,  Fevers, chills, + fatigue, or  lassitude.  HEENT:   No headaches,  Difficulty swallowing,  Tooth/dental problems, or  Sore throat,                No sneezing, itching, ear ache, nasal congestion, post nasal drip,   CV:  No chest pain,  Orthopnea, PND, swelling in lower extremities, anasarca, dizziness, palpitations, syncope.   GI  No heartburn, indigestion, abdominal pain, nausea, vomiting, diarrhea, change in bowel habits, loss of appetite, bloody stools.   Resp:   No excess mucus, no productive cough,  No non-productive cough,  No coughing up of blood.  No change in color of mucus.  No wheezing.  No chest wall deformity  Skin: no rash or lesions.  GU: HD   MS:  No joint pain or swelling.  No decreased range of motion.  No back pain.    Physical Exam  BP (!) 157/67 (BP Location: Right Arm, Patient Position: Sitting, Cuff Size: Normal)   Pulse 62   Ht 5\' 4"  (1.626 m)   Wt 130 lb (59 kg)   SpO2 99%   BMI 22.31 kg/m   GEN: A/Ox3; pleasant , NAD, thin    HEENT:  [place]/AT,  EACs-clear, TMs-wnl, NOSE-clear, THROAT-clear, no lesions, no postnasal drip or exudate noted.   NECK:  Supple w/ fair ROM; no JVD; normal carotid impulses w/o bruits; no thyromegaly or nodules palpated; no lymphadenopathy.    RESP  Clear  P & A; w/o, wheezes/ rales/ or  rhonchi. no accessory muscle use, no dullness to percussion  CARD:  RRR, no m/r/g, no peripheral edema, pulses intact, no cyanosis or clubbing.  GI:   Soft & nt; nml bowel sounds; no organomegaly or masses detected.   Musco: Warm bil, no deformities or joint swelling noted.   Neuro: alert, no focal deficits noted.    Skin: Warm, no lesions or rashes     Lab Results:  CBC  Component Value Date/Time   WBC 8.6 06/20/2016 0444   RBC 3.11 (L) 06/20/2016 0444   HGB 8.9 (L) 06/20/2016 0444   HCT 26.8 (L) 06/20/2016 0444   PLT 232 06/20/2016 0444   MCV 86.2 06/20/2016 0444   MCH 28.6 06/20/2016 0444   MCHC 33.2 06/20/2016 0444   RDW 16.9 (H) 06/20/2016 0444   LYMPHSABS 0.9 06/14/2016 1543   MONOABS 0.6 06/14/2016 1543   EOSABS 0.1 06/14/2016 1543   BASOSABS 0.0 06/14/2016 1543    BMET    Component Value Date/Time   NA 133 (L) 06/20/2016 0444   K 3.8 06/20/2016 0444   CL 94 (L) 06/20/2016 0444   CO2 29 06/20/2016 0444   GLUCOSE 117 (H) 06/20/2016 0444   BUN 25 (H) 06/20/2016 0444   CREATININE 4.86 (H) 06/20/2016 0444   CALCIUM 8.2 (L) 06/20/2016 0444   GFRNONAA 12 (L) 06/20/2016 0444   GFRAA 13 (L) 06/20/2016 0444    BNP No results found for: BNP  ProBNP    Component Value Date/Time   PROBNP 29,725.0 (H) 01/25/2011 1950    Imaging: No results found.   Assessment & Plan:   Pulmonary hypertension, moderate to severe (HCC) Autoimmune workup neg.  Check sleep study and PFT  No oxygen needed at this time  Cont w/ b/p control and HD for volume status .    Congestive heart failure Cont follow up with Cardiology .    COPD (chronic obstructive pulmonary disease) (HCC) Possible COPD w/ previous smoker  Will need PFT  Duoneb rx to have for As needed  Use.       Rubye Oaks, NP 07/15/2016

## 2016-07-15 NOTE — Assessment & Plan Note (Signed)
Cont follow up with Cardiology  

## 2016-07-15 NOTE — Patient Instructions (Addendum)
Set up for Sleep study .  Use Duoneb every 6hrs as needed wheezing /shortness of breath. -order sent to home care for meds/machine  Follow up Dr. Delton CoombesByrum  In 6-8 weeks with PFT .

## 2016-07-22 ENCOUNTER — Ambulatory Visit (INDEPENDENT_AMBULATORY_CARE_PROVIDER_SITE_OTHER): Payer: Medicare Other | Admitting: Interventional Cardiology

## 2016-07-22 ENCOUNTER — Encounter: Payer: Self-pay | Admitting: Interventional Cardiology

## 2016-07-22 VITALS — BP 128/60 | HR 64 | Ht 65.0 in | Wt 135.2 lb

## 2016-07-22 DIAGNOSIS — I2510 Atherosclerotic heart disease of native coronary artery without angina pectoris: Secondary | ICD-10-CM

## 2016-07-22 DIAGNOSIS — N186 End stage renal disease: Secondary | ICD-10-CM

## 2016-07-22 DIAGNOSIS — I10 Essential (primary) hypertension: Secondary | ICD-10-CM | POA: Diagnosis not present

## 2016-07-22 DIAGNOSIS — I5042 Chronic combined systolic (congestive) and diastolic (congestive) heart failure: Secondary | ICD-10-CM

## 2016-07-22 DIAGNOSIS — I272 Pulmonary hypertension, unspecified: Secondary | ICD-10-CM | POA: Diagnosis not present

## 2016-07-22 DIAGNOSIS — I469 Cardiac arrest, cause unspecified: Secondary | ICD-10-CM

## 2016-07-22 NOTE — Progress Notes (Signed)
Cardiology Office Note    Date:  07/22/2016   ID:  Jonathan Terry, DOB [birth year], MRN [id number]  PCP:  Jonathan AmmonsHaque, Imran P, MD  Cardiologist: Jonathan NoeHenry W Smith III, MD   Chief Complaint  Patient presents with  . Loss of Consciousness    History of Present Illness:  Jonathan Terry is a 61 y.o. male this is the first office visit follow-up for this gentleman with history of PEA cardiac arrest June 2018, moderate obstructive coronary disease, chronic combined systolic and diastolic heart failure, in stage kidney disease on dialysis, and severe pulmonary hypertension. History of tobacco use.  Jonathan Terry had a pulseless electrical activity cardiac arrest. Subsequent evaluation included coronary angiography which demonstrated severe disease in the PDA but otherwise nonobstructive disease. LVEF 40-45% with diffuse hypokinesis, and severe pulmonary hypertension with estimated PA systolic pressure of 87 mmHg. Prior to admission, he states that there had been recurring episodes of exertion related weakness and syncope/near-syncope starting in October 2017. He has never had loss of consciousness or near syncope while sitting or lying.  Since discharge from the hospital he has done well. He saw Dr. Delton CoombesByrum in the pulmonary clinic recently. Evaluation is ongoing.    Past Medical History:  Diagnosis Date  . Anemia 01/29/2011  . Anemia   . Anginal pain (HCC)    none recently since started dialysis  . Congestive heart failure (HCC) 01/29/2011   none since on dialysis  . CVA (cerebral vascular accident) (HCC)   . Dizziness 606/05/13  . ESRD (end stage renal disease) on dialysis (HCC)   . GERD (gastroesophageal reflux disease)   . Hypertension   . Renal failure (ARF), acute on chronic (HCC)   . Tobacco use     Past Surgical History:  Procedure Laterality Date  . AV FISTULA PLACEMENT  01/28/2011   Procedure: ARTERIOVENOUS (AV) FISTULA CREATION;  Surgeon: Jonathan OchoaJames Terry Lawson, MD;  [facility];   Service: Vascular;  Laterality: Left;  . AV FISTULA PLACEMENT  01/28/2011   Procedure: ARTERIOVENOUS (AV) FISTULA CREATION;  Surgeon: Jonathan Hinthristopher S Dickson, MD;  [facility];  Service: Vascular;;  . INTRAVASCULAR PRESSURE WIRE/FFR STUDY N/A 06/17/2016   Procedure: Intravascular Pressure Wire/FFR Study;  Surgeon: Jonathan HazelMcAlhany, Christopher D, MD;  Location: [facility] INVASIVE CV LAB;  Service: Cardiovascular;  Laterality: N/A;  . RIGHT/LEFT HEART CATH AND CORONARY ANGIOGRAPHY N/A 06/17/2016   Procedure: Right/Left Heart Cath and Coronary Angiography;  Surgeon: Jonathan HazelMcAlhany, Christopher D, MD;  Location: [facility] INVASIVE CV LAB;  Service: Cardiovascular;  Laterality: N/A;    Current Medications: Outpatient Medications Prior to Visit  Medication Sig Dispense Refill  . aspirin EC 81 MG EC tablet Take 1 tablet (81 mg total) by mouth daily.    Marland Kitchen. FIBER PO Take 1 capsule by mouth 3 (three) times daily.    Marland Kitchen. FLUoxetine (PROZAC) 20 MG capsule Take 20 mg by mouth 2 (two) times daily.    . hydrALAZINE (APRESOLINE) 25 MG tablet Take 1 tablet (25 mg total) by mouth every 8 (eight) hours. 90 tablet 0  . ipratropium (ATROVENT HFA) 17 MCG/ACT inhaler Inhale 1 puff into the lungs 2 (two) times daily as needed for wheezing.    Marland Kitchen. ipratropium-albuterol (DUONEB) 0.5-2.5 (3) MG/3ML SOLN Take 3 mLs by nebulization every 6 (six) hours as needed (for shortness of breath).    . isosorbide dinitrate (ISORDIL) 10 MG tablet Take 1 tablet (10 mg total) by mouth 3 (three) times daily. 90 tablet 0  .  metoprolol succinate (TOPROL-XL) 25 MG 24 hr tablet Take 1 tablet (25 mg total) by mouth daily. 30 tablet 0  . Multiple Vitamins-Minerals (CENTRUM SILVER PO) Take 1 tablet by mouth daily.    Marland Kitchen omeprazole (PRILOSEC) 20 MG capsule Take 20 mg by mouth daily.    . Oxycodone HCl 10 MG TABS Take 10 mg by mouth every 6 (six) hours as needed (for pain).    . rosuvastatin (CRESTOR) 10 MG tablet Take 10 mg by mouth daily.    Marland Kitchen ipratropium-albuterol (DUONEB)  0.5-2.5 (3) MG/3ML SOLN Take 3 mLs by nebulization every 6 (six) hours as needed. (Patient not taking: Reported on 07/22/2016) 360 mL 1  . metoprolol tartrate (LOPRESSOR) 25 MG tablet Take 25 mg by mouth at bedtime.    . Multiple Vitamins-Minerals (CENTRUM SILVER PO) Take 1 tablet by mouth daily.    . traZODone (DESYREL) 50 MG tablet Take 50 mg by mouth at bedtime.     No facility-administered medications prior to visit.      Allergies:   Patient has no known allergies.   Social History   Social History  . Marital status: Unknown    Spouse name: N/A  . Number of children: N/A  . Years of education: N/A   Occupational History  . unemployed    Social History Main Topics  . Smoking status: Never Smoker  . Smokeless tobacco: Never Used  . Alcohol use No  . Drug use: No  . Sexual activity: No   Other Topics Concern  . None   Social History Narrative   ** Merged History Encounter **       Live alone. From [place]. No wife or children. Unemployed for the past 3 months. Use to work in Glass blower/designer.      Family History:  The patient's family history includes Hypertension in his father; Kidney disease in his father.   ROS:   Please see the history of present illness.    Decreased memory, unexplained weight loss, excessive fatigue, anxiety, difficulty with balance and walking, headaches, dizziness, muscle pain, back pain, depression, abdominal discomfort, cough, and vision disturbance. Does not currently smoke.  All other systems reviewed and are negative.   PHYSICAL EXAM:   VS:  BP 128/60 (BP Location: Right Arm)   Pulse 64   Ht 5\' 5"  (1.651 m)   Wt 135 lb 3.2 oz (61.3 kg)   BMI 22.50 kg/m    GEN: Well nourished, well developed, in no acute distress  HEENT: normal  Neck: no JVD while sitting upright. Moderate elevation while lying. No carotid bruits, or masses. Cardiac: RRR; no murmurs, rubs, or gallops,no edema  Respiratory:  clear to auscultation bilaterally,  normal work of breathing GI: soft, nontender, nondistended, + BS MS: no deformity or atrophy  Skin: warm and dry, no rash Neuro:  Alert and Oriented x 3, Strength and sensation are intact Psych: euthymic mood, full affect  Wt Readings from Last 3 Encounters:  07/22/16 135 lb 3.2 oz (61.3 kg)  07/15/16 130 lb (59 kg)  06/20/16 133 lb (60.3 kg)      Studies/Labs Reviewed:   EKG:  EKG  none  Recent Labs: 06/16/2016: TSH 3.696 06/19/2016: Magnesium 2.1 06/20/2016: ALT 14; BUN 25; Creatinine, Ser 4.86; Hemoglobin 8.9; Platelets 232; Potassium 3.8; Sodium 133   Lipid Panel No results found for: CHOL, TRIG, HDL, CHOLHDL, VLDL, LDLCALC, LDLDIRECT  Additional studies/ records that were reviewed today include:  Echocardiogram performed June 2018: Study Conclusions  -  Left ventricle: The cavity size was mildly dilated. There was   mild concentric hypertrophy. Systolic function was mildly to   moderately reduced. The estimated ejection fraction was in the   range of 40% to 45%. Diffuse hypokinesis. Doppler parameters are   consistent with abnormal left ventricular relaxation (grade 1   diastolic dysfunction). Doppler parameters are consistent with   elevated ventricular end-diastolic filling pressure. - Aortic valve: There was trivial regurgitation. - Mitral valve: Calcified annulus. Mildly thickened leaflets .   There was mild regurgitation. - Left atrium: The atrium was severely dilated. - Right ventricle: The cavity size was moderately dilated. Wall   thickness was normal. Systolic function was mildly reduced. - Right atrium: The atrium was severely dilated. - Tricuspid valve: There was moderate regurgitation. - Pulmonic valve: There was trivial regurgitation. - Pulmonary arteries: Systolic pressure was severely increased. PA   peak pressure: 87 mm Hg (S). - Pericardium, extracardiac: A mild pericardial effusion was   identified posterior to the heart. Features were not  consistent   with tamponade physiology.  Impressions:  - LVEF 40-45%, mild diffuse hypokinesis, moderate concentric LVH.   RV is moderately dilated with moderately decreased RV systolic   function.   Severe pulmonary hypertension, RVSP 87 mmHg.   Severe biatrial dilatation.   Cardiac catheterization performed June 2018: Coronary Diagrams   Diagnostic Diagram          ASSESSMENT:    1. Pulmonary hypertension, moderate to severe (HCC)   2. Coronary artery disease involving native coronary artery of native heart without angina pectoris   3. Essential hypertension   4. End stage renal disease (HCC)   5. Cardiac arrest (HCC)   6. Chronic combined systolic and diastolic heart failure (HCC)      PLAN:  In order of problems listed above:  1. Etiology is uncertain but I believe it is connected to the patient's exertion related episodes of syncope and probably related to his PEA arrest. 2. Asymptomatic and no further management issues or treatment at this time. 3. Blood pressure is well-controlled 4. Not addressed and apparently no difficulties with dialysis. 5. No recurrence. 6. No evidence of 5 overload.  Clinical follow-up in 4 months. Repeat echocardiogram prior to that office visit to determine status of pulmonary hypertension. Discuss with pulmonary specialist, Dr. Delton Coombes.  Medication Adjustments/Labs and Tests Ordered: Current medicines are reviewed at length with the patient today.  Concerns regarding medicines are outlined above.  Medication changes, Labs and Tests ordered today are listed in the Patient Instructions below. Patient Instructions  Medication Instructions:  None  Labwork: None  Testing/Procedures: Your physician has requested that you have an echocardiogram 2-3 weeks prior to follow up with Dr. Katrinka Blazing. Echocardiography is a painless test that uses sound waves to create images of your heart. It provides your doctor with information about the size and  shape of your heart and how well your heart's chambers and valves are working. This procedure takes approximately one hour. There are no restrictions for this procedure.    Follow-Up: Your physician recommends that you schedule a follow-up appointment in: 3-4 months with Dr. Katrinka Blazing.   Any Other Special Instructions Will Be Listed Below (If Applicable).     If you need a refill on your cardiac medications before your next appointment, please call your pharmacy.      Signed, Jonathan Noe, MD  07/22/2016 3:06 PM    [facility] [address] [place],  , [place]  [address] Phone: [phone]; Fax: [phone]  

## 2016-07-22 NOTE — Patient Instructions (Signed)
Medication Instructions:  None  Labwork: None  Testing/Procedures: Your physician has requested that you have an echocardiogram 2-3 weeks prior to follow up with Dr. Katrinka BlazingSmith. Echocardiography is a painless test that uses sound waves to create images of your heart. It provides your doctor with information about the size and shape of your heart and how well your heart's chambers and valves are working. This procedure takes approximately one hour. There are no restrictions for this procedure.    Follow-Up: Your physician recommends that you schedule a follow-up appointment in: 3-4 months with Dr. Katrinka BlazingSmith.   Any Other Special Instructions Will Be Listed Below (If Applicable).     If you need a refill on your cardiac medications before your next appointment, please call your pharmacy.

## 2016-09-10 ENCOUNTER — Encounter (HOSPITAL_BASED_OUTPATIENT_CLINIC_OR_DEPARTMENT_OTHER): Payer: Medicare Other

## 2016-09-17 ENCOUNTER — Ambulatory Visit (HOSPITAL_BASED_OUTPATIENT_CLINIC_OR_DEPARTMENT_OTHER): Payer: Medicare Other | Attending: Adult Health | Admitting: Pulmonary Disease

## 2016-09-17 VITALS — Ht 64.0 in | Wt 135.0 lb

## 2016-09-17 DIAGNOSIS — G4733 Obstructive sleep apnea (adult) (pediatric): Secondary | ICD-10-CM | POA: Diagnosis not present

## 2016-09-17 DIAGNOSIS — G4739 Other sleep apnea: Secondary | ICD-10-CM

## 2016-09-17 DIAGNOSIS — I272 Pulmonary hypertension, unspecified: Secondary | ICD-10-CM

## 2016-09-17 DIAGNOSIS — G4731 Primary central sleep apnea: Secondary | ICD-10-CM

## 2016-09-17 DIAGNOSIS — I509 Heart failure, unspecified: Secondary | ICD-10-CM

## 2016-09-22 ENCOUNTER — Telehealth: Payer: Self-pay | Admitting: Pulmonary Disease

## 2016-09-22 DIAGNOSIS — G4733 Obstructive sleep apnea (adult) (pediatric): Secondary | ICD-10-CM

## 2016-09-22 DIAGNOSIS — I272 Pulmonary hypertension, unspecified: Secondary | ICD-10-CM | POA: Diagnosis not present

## 2016-09-22 DIAGNOSIS — G4731 Primary central sleep apnea: Secondary | ICD-10-CM | POA: Diagnosis not present

## 2016-09-22 NOTE — Procedures (Signed)
   Patient Name: Jonathan Terry, Jonathan Terry Date: 09/17/2016 Gender: Male D.O.B: 07/03/1955 Age (years): 61 Referring Provider: Lynelle Smoke Parrett Height (inches): 40 Interpreting Physician: Chesley Mires MD, ABSM Weight (lbs): 135 RPSGT: Madelon Lips BMI: 23 MRN: [id number] Neck Size: 14.50  CLINICAL INFORMATION Sleep Study Type: Split Night CPAP  Indication for sleep study: 61 yr old with history of pulmonary hypertension, CVA, hypertension, snoring, sleep disruption, and daytime sleepiness.  Epworth Sleepiness Score: 20  SLEEP STUDY TECHNIQUE As per the AASM Manual for the Scoring of Sleep and Associated Events v2.3 (April 2016) with a hypopnea requiring 4% desaturations.  The channels recorded and monitored were frontal, central and occipital EEG, electrooculogram (EOG), submentalis EMG (chin), nasal and oral airflow, thoracic and abdominal wall motion, anterior tibialis EMG, snore microphone, electrocardiogram, and pulse oximetry. Continuous positive airway pressure (CPAP) was initiated when the patient met split night criteria and was titrated according to treat sleep-disordered breathing.  MEDICATIONS Medications self-administered by patient taken the night of the study : N/A  RESPIRATORY PARAMETERS Diagnostic  Total AHI (/hr): 21.6 RDI (/hr): 32.2 OA Index (/hr): 2.6 CA Index (/hr): 0.0 REM AHI (/hr): 18.9 NREM AHI (/hr): 22.1 Supine AHI (/hr): 21.6 Non-supine AHI (/hr): N/A Min O2 Sat (%): 81.00 Mean O2 (%): 93.17 Time below 88% (min): 3.1    Titration  Optimal Pressure (cm): 7 AHI at Optimal Pressure (/hr): 3.4 Min O2 at Optimal Pressure (%): 91.0 Supine % at Optimal (%): 100 Sleep % at Optimal (%): 97   SLEEP ARCHITECTURE The recording time for the entire night was 398.6 minutes.  During a baseline period of 201.5 minutes, the patient slept for 164.0 minutes in REM and nonREM, yielding a sleep efficiency of 81.4%. Sleep onset after lights out was 9.2 minutes with a REM  latency of 162.0 minutes. The patient spent 22.87% of the night in stage N1 sleep, 59.76% in stage N2 sleep, 0.00% in stage N3 and 17.38% in REM.  During the titration period of 192.4 minutes, the patient slept for 148.0 minutes in REM and nonREM, yielding a sleep efficiency of 76.9%. Sleep onset after CPAP initiation was 4.5 minutes with a REM latency of N/A minutes. The patient spent 22.30% of the night in stage N1 sleep, 77.03% in stage N2 sleep, 0.68% in stage N3 and 0.00% in REM.  CARDIAC DATA The 2 lead EKG demonstrated sinus rhythm. The mean heart rate was 71.08 beats per minute. Other EKG findings include: None. LEG MOVEMENT DATA The total Periodic Limb Movements of Sleep (PLMS) were 40. The PLMS index was 7.69 .  IMPRESSIONS - Moderate obstructive sleep apnea occurred during the diagnostic portion of the study(AHI = 21.6/hour and Min O2 = 81%). - An optimal PAP pressure was selected for this patient ( 7 cm of water).  He was noted to have central apnea with higher pressures.  DIAGNOSIS - Obstructive Sleep Apnea (G47.33) - Treatment Emergent Central Sleep Apnea (G47.31)  RECOMMENDATIONS - Trial of CPAP therapy on 7 cm H2O with a Medium size Resmed Full Face Mask AirFit F20 mask and heated humidification.  [Electronically signed] 09/22/2016 10:34 AM  Chesley Mires MD, ABSM Diplomate, American Board of Sleep Medicine   NPI: [id number]

## 2016-09-22 NOTE — Telephone Encounter (Signed)
PSG 09/17/16 >> AHI 21.6, SaO2 low 81%.  CPAP 7 cm H2O.  Centrals with higher pressure.   Will have my nurse inform pt that sleep study shows moderate sleep apnea.  Options are 1) CPAP now, 2) ROV first.  If He is agreeable to CPAP, then please send order for CPAP 7 cm H2O with heated humidity and mask of choice.  Have download sent 1 month after starting CPAP and set up ROV 2 months after starting CPAP.  ROV can be with me or NP.

## 2016-09-23 NOTE — Telephone Encounter (Signed)
Called pt and spoke with him and his wife letting them know the results of the sleep study pt had done. Gave them the two options for them to choose from, and they said to go ahead and start on CPAP. Will place order for CPAP to begin. OV was made with TP to follow-up after CPAP. Nothing further needed at this time.

## 2016-10-07 ENCOUNTER — Other Ambulatory Visit: Payer: Self-pay

## 2016-10-07 ENCOUNTER — Ambulatory Visit (HOSPITAL_COMMUNITY): Payer: Medicare Other | Attending: Cardiology

## 2016-10-07 DIAGNOSIS — I509 Heart failure, unspecified: Secondary | ICD-10-CM | POA: Insufficient documentation

## 2016-10-07 DIAGNOSIS — Z720 Tobacco use: Secondary | ICD-10-CM | POA: Diagnosis not present

## 2016-10-07 DIAGNOSIS — I081 Rheumatic disorders of both mitral and tricuspid valves: Secondary | ICD-10-CM | POA: Insufficient documentation

## 2016-10-07 DIAGNOSIS — I272 Pulmonary hypertension, unspecified: Secondary | ICD-10-CM | POA: Diagnosis present

## 2016-10-07 DIAGNOSIS — Z8674 Personal history of sudden cardiac arrest: Secondary | ICD-10-CM | POA: Diagnosis not present

## 2016-10-07 DIAGNOSIS — I2510 Atherosclerotic heart disease of native coronary artery without angina pectoris: Secondary | ICD-10-CM | POA: Insufficient documentation

## 2016-10-11 ENCOUNTER — Telehealth: Payer: Self-pay | Admitting: Interventional Cardiology

## 2016-10-11 NOTE — Telephone Encounter (Signed)
Left message to call back  

## 2016-10-11 NOTE — Telephone Encounter (Signed)
NEw Message  Pt wife call to get echo results. Please call back to discuss

## 2016-10-21 ENCOUNTER — Ambulatory Visit: Payer: Medicare Other | Admitting: Interventional Cardiology

## 2016-10-21 NOTE — Telephone Encounter (Signed)
Spoke with Pam and made her aware of echo results.  Pam verbalized understanding and was appreciative for call.

## 2016-10-21 NOTE — Telephone Encounter (Signed)
Spoke with pt and obtained permission to give results to Caprice Beaver, emergency contact

## 2016-11-30 ENCOUNTER — Telehealth: Payer: Self-pay | Admitting: Adult Health

## 2016-11-30 ENCOUNTER — Ambulatory Visit (INDEPENDENT_AMBULATORY_CARE_PROVIDER_SITE_OTHER): Payer: Medicare Other | Admitting: Adult Health

## 2016-11-30 ENCOUNTER — Encounter: Payer: Self-pay | Admitting: Adult Health

## 2016-11-30 DIAGNOSIS — I2510 Atherosclerotic heart disease of native coronary artery without angina pectoris: Secondary | ICD-10-CM

## 2016-11-30 DIAGNOSIS — I272 Pulmonary hypertension, unspecified: Secondary | ICD-10-CM

## 2016-11-30 DIAGNOSIS — J449 Chronic obstructive pulmonary disease, unspecified: Secondary | ICD-10-CM

## 2016-11-30 DIAGNOSIS — G4733 Obstructive sleep apnea (adult) (pediatric): Secondary | ICD-10-CM | POA: Diagnosis not present

## 2016-11-30 MED ORDER — IPRATROPIUM-ALBUTEROL 0.5-2.5 (3) MG/3ML IN SOLN: 3.0000 mL | Freq: Four times a day (QID) | RESPIRATORY_TRACT | 5 refills | Status: DC | PRN

## 2016-11-30 MED ORDER — IPRATROPIUM-ALBUTEROL 0.5-2.5 (3) MG/3ML IN SOLN: 3.0000 mL | Freq: Four times a day (QID) | RESPIRATORY_TRACT | 5 refills | Status: AC

## 2016-11-30 NOTE — Patient Instructions (Addendum)
Restart CPAP At bedtime  . Try to wear for at least 4hr each night .  Use Duoneb every 6hrs as needed wheezing /shortness of breath. -order sent to home care for meds/machine  Follow up with Dr. Craige CottaSood  In 2 months and As needed  With PFT .  Please contact office for sooner follow up if symptoms do not improve or worsen or seek emergency care

## 2016-11-30 NOTE — Progress Notes (Signed)
I have reviewed and agree with assessment/plan.  Coralyn HellingVineet Rigby Leonhardt, MD [facility] 11/30/2016, 12:27 PM Pager:  [phone]

## 2016-11-30 NOTE — Addendum Note (Signed)
Addended by: Boone MasterJONES, JESSICA E on: 11/30/2016 12:41 PM   Modules accepted: Orders

## 2016-11-30 NOTE — Assessment & Plan Note (Signed)
Moderate to severe pulmonary hypertension with underlying sleep apnea.  Patient is encouraged on CPAP compliance continue with dialysis for volume management..Marland Kitchen

## 2016-11-30 NOTE — Assessment & Plan Note (Signed)
Moderate sleep apnea with underlying pulmonary hypertension.  Patient is encouraged on CPAP compliance.  Will change to an AutoSet 5-8 cm H2O.  Patient is return in 6-8 weeks with a CPAP download.

## 2016-11-30 NOTE — Progress Notes (Signed)
@Patient  ID: Jonathan Terry, male    DOB: [birth year], 61 y.o.   MRN: [id number]  Chief Complaint  Patient presents with  . Follow-up    OSA    Referring provider: Shelbie Ammons, MD  HPI: 61 yo hispanic male(moved to [place] in 1985)  former smoker (quit 2013) ( seen for PCCM consult during hospitalization for cardiac arrest 06/2016. ) Has ESRD on HD (since 2013)  , CVA, Pulmonary HTN, ETOH (quit 2013)   TEST/Eents  Cardiac arrest s/p CPR 06/2016 >CAD,  CT head 06/14/16 >> old Lt posterior lenticular nucleus CVA, atrophy CT angio chest 6/04 >> mod Rt and trace Lt pleural effusion, CM, ascites, dilated PA, ATX LLL, atherosclerosis EEG 6/04 >> diffuse suppression and slowing Echo 6/04 >> EF 40 to 45%, grade 1 DD, mild MR, severe LA dilation, mod RV dilation, mod TR, PAS 87 mmHg Right / Left Heart Cath 06/17/16 >> triple vessel CAD( medical management )( moderately severe pulmonary hypertension Autoimmune 6/9: HIV >>NR  ANCA >>NEG  RF >>NEG  ANA >>NEG   Echo 09/2016 >EF 50-55%, LA severe dilated, PAP .    11/30/2016 Follow up: Presumed COPD , CHF , Pulmonary HTN, OSA Patient returns for follow-up.  Patient was seen last July 2018 for a post hospital follow-up.  Patient has been admitted after a out of facility cardiac arrest status post CPR.  Patient was seen by cardiology during hospitalization found to have three-vessel disease recommended for medical treatment.  Found to have severe pulmonary hypertension during cath.  Autoimmune workup was negative.  Echo showed EF of 40-45% pulmonary artery pressure at 87 mmHg.  CT chest was negative for PE.  Positive for bilateral pleural effusions.  Following diuresis chest x-ray showed improvement.  Patient is a former smoker.  He was set up for PFTs but did not return as recommended.  Since last visit patient was seen by cardiology.  A follow-up echo September 2018 showed improved EF at 50-55% pulmonary artery pressure 59 mmHg.Marland Kitchen Patient was set  up for a sleep study that showed moderate sleep apnea with an AHI at 21/hr. patient was recommended CPAP at 7 cm of H2O.  At higher pressures central apneas developed.  Patient says he is feeling better since last visit.  Shortness of breath has improved.  His activity tolerance has increased.  Patient has not been wearing his CPAP.  He says that he feels that he is fighting against the mask.  He also did not understand he was supposed to wear it each night.  I went over the instructions with him and his girlfriend.  He denies any chest pain orthopnea PND or leg swelling.  Patient has end-stage renal disease and does dialysis 3 days a week. He uses DuoNeb nebulizer as needed.  He says he uses it maybe a couple times a week.    No Known Allergies  Immunization History  Administered Date(s) Administered  . Influenza Split 01/27/2011, 10/30/2016    Past Medical History:  Diagnosis Date  . Anemia 01/29/2011  . Anemia   . Anginal pain (HCC)    none recently since started dialysis  . Congestive heart failure (HCC) 01/29/2011   none since on dialysis  . CVA (cerebral vascular accident) (HCC)   . Dizziness 606/05/13  . ESRD (end stage renal disease) on dialysis (HCC)   . GERD (gastroesophageal reflux disease)   . Hypertension   . Renal failure (ARF), acute on chronic (HCC)   . Tobacco  use     Tobacco History: Social History   Tobacco Use  Smoking Status Never Smoker  Smokeless Tobacco Never Used   Counseling given: Not Answered   Outpatient Encounter Medications as of 11/30/2016  Medication Sig  . aspirin EC 81 MG EC tablet Take 1 tablet (81 mg total) by mouth daily.  Marland Kitchen. FIBER PO Take 1 capsule by mouth 3 (three) times daily.  Marland Kitchen. FLUoxetine (PROZAC) 20 MG capsule Take 20 mg by mouth 2 (two) times daily.  . hydrALAZINE (APRESOLINE) 25 MG tablet Take 1 tablet (25 mg total) by mouth every 8 (eight) hours.  Marland Kitchen. ipratropium (ATROVENT HFA) 17 MCG/ACT inhaler Inhale 1 puff into the lungs 2  (two) times daily as needed for wheezing.  Marland Kitchen. ipratropium-albuterol (DUONEB) 0.5-2.5 (3) MG/3ML SOLN Take 3 mLs by nebulization every 6 (six) hours as needed (for shortness of breath).  . isosorbide dinitrate (ISORDIL) 10 MG tablet Take 1 tablet (10 mg total) by mouth 3 (three) times daily.  . metoprolol succinate (TOPROL-XL) 25 MG 24 hr tablet Take 1 tablet (25 mg total) by mouth daily.  . Multiple Vitamins-Minerals (CENTRUM SILVER PO) Take 1 tablet by mouth daily.  Marland Kitchen. omeprazole (PRILOSEC) 20 MG capsule Take 20 mg by mouth daily.  . Oxycodone HCl 10 MG TABS Take 10 mg by mouth every 6 (six) hours as needed (for pain).  . rosuvastatin (CRESTOR) 10 MG tablet Take 10 mg by mouth daily.   No facility-administered encounter medications on file as of 11/30/2016.      Review of Systems  Constitutional:   No  weight loss, night sweats,  Fevers, chills,  +fatigue, or  lassitude.  HEENT:   No headaches,  Difficulty swallowing,  Tooth/dental problems, or  Sore throat,                No sneezing, itching, ear ache, nasal congestion, post nasal drip,   CV:  No chest pain,  Orthopnea, PND, swelling in lower extremities, anasarca, dizziness, palpitations, syncope.   GI  No heartburn, indigestion, abdominal pain, nausea, vomiting, diarrhea, change in bowel habits, loss of appetite, bloody stools.   Resp:  No excess mucus, no productive cough,  No non-productive cough,  No coughing up of blood.  No change in color of mucus.  No wheezing.  No chest wall deformity  Skin: no rash or lesions.  GU: no dysuria, change in color of urine, no urgency or frequency.  No flank pain, no hematuria   MS:  No joint pain or swelling.  No decreased range of motion.  No back pain.    Physical Exam  BP 114/62 (BP Location: Right Arm, Cuff Size: Normal)   Pulse 72   Ht 5\' 5"  (1.651 m)   Wt 139 lb 6.4 oz (63.2 kg)   SpO2 99%   BMI 23.20 kg/m   GEN: A/Ox3; pleasant , NAD, thin and elderly   HEENT:  [place]/AT,   EACs-clear, TMs-wnl, NOSE-clear, THROAT-clear, no lesions, no postnasal drip or exudate noted.   NECK:  Supple w/ fair ROM; no JVD; normal carotid impulses w/o bruits; no thyromegaly or nodules palpated; no lymphadenopathy.    RESP  Clear  P & A; w/o, wheezes/ rales/ or rhonchi. no accessory muscle use, no dullness to percussion  CARD:  RRR, no m/r/g, no peripheral edema, pulses intact, no cyanosis or clubbing.  GI:   Soft & nt; nml bowel sounds; no organomegaly or masses detected.   Musco: Warm bil, no deformities  or joint swelling noted.   Neuro: alert, no focal deficits noted.    Skin: Warm, no lesions or rashes    Lab Results:  CBC   BNP No results found for: BNP  ProBNP Imaging: No results found.   Assessment & Plan:   COPD (chronic obstructive pulmonary disease) (HCC) Presumed COPD and a former smoker.  Appears stable.  Will check PFTs on return.  Continue DuoNeb as needed  OSA (obstructive sleep apnea) Moderate sleep apnea with underlying pulmonary hypertension.  Patient is encouraged on CPAP compliance.  Will change to an AutoSet 5-8 cm H2O.  Patient is return in 6-8 weeks with a CPAP download.  Pulmonary hypertension, moderate to severe (HCC) Moderate to severe pulmonary hypertension with underlying sleep apnea.  Patient is encouraged on CPAP compliance continue with dialysis for volume management.Rubye Oaks.       Tammy Parrett, NP 11/30/2016

## 2016-11-30 NOTE — Telephone Encounter (Signed)
Called [facility] to clarify Rx. I changed the directions to every 6 hours without the PRN so medicare will pay. Nothing further is needed.

## 2016-11-30 NOTE — Assessment & Plan Note (Signed)
Presumed COPD and a former smoker.  Appears stable.  Will check PFTs on return.  Continue DuoNeb as needed

## 2016-12-15 NOTE — Progress Notes (Signed)
Cardiology Office Note    Date:  12/16/2016   ID:  Jonathan Terry, DOB 08-11-1955, MRN [id number]  PCP:  Shelbie AmmonsHaque, Imran P, MD  Cardiologist: Lesleigh NoeHenry W Brennan Karam III, MD   Chief Complaint  Patient presents with  . Coronary Artery Disease    History of Present Illness:  Jonathan Terry is a 61 y.o. male follow-up for this gentleman with history of PEA cardiac arrest June 2018, moderate obstructive coronary disease, chronic combined systolic and diastolic heart failure, in stage kidney disease on dialysis, and severe pulmonary hypertension. History of tobacco use.  He is doing well.  His wife states that his overall functional capacity has significantly increased.  He has not had chest pain.  No episodes of recurrent syncope.  Tolerating dialysis without difficulty.  Has history of right carotid endarterectomy and left carotid is being followed chronically.  Past Medical History:  Diagnosis Date  . Anemia 01/29/2011  . Anemia   . Anginal pain (HCC)    none recently since started dialysis  . Congestive heart failure (HCC) 01/29/2011   none since on dialysis  . CVA (cerebral vascular accident) (HCC)   . Dizziness 606/05/13  . ESRD (end stage renal disease) on dialysis (HCC)   . GERD (gastroesophageal reflux disease)   . Hypertension   . Renal failure (ARF), acute on chronic (HCC)   . Tobacco use     Past Surgical History:  Procedure Laterality Date  . AV FISTULA PLACEMENT  01/28/2011   Procedure: ARTERIOVENOUS (AV) FISTULA CREATION;  Surgeon: Pryor OchoaJames D Lawson, MD;  [facility];  Service: Vascular;  Laterality: Left;  . AV FISTULA PLACEMENT  01/28/2011   Procedure: ARTERIOVENOUS (AV) FISTULA CREATION;  Surgeon: Chuck Hinthristopher S Dickson, MD;  [facility];  Service: Vascular;;  . INTRAVASCULAR PRESSURE WIRE/FFR STUDY N/A 06/17/2016   Procedure: Intravascular Pressure Wire/FFR Study;  Surgeon: Kathleene HazelMcAlhany, Christopher D, MD;  Location: [facility] INVASIVE CV LAB;  Service: Cardiovascular;   Laterality: N/A;  . RIGHT/LEFT HEART CATH AND CORONARY ANGIOGRAPHY N/A 06/17/2016   Procedure: Right/Left Heart Cath and Coronary Angiography;  Surgeon: Kathleene HazelMcAlhany, Christopher D, MD;  Location: [facility] INVASIVE CV LAB;  Service: Cardiovascular;  Laterality: N/A;    Current Medications: Outpatient Medications Prior to Visit  Medication Sig Dispense Refill  . aspirin EC 81 MG EC tablet Take 1 tablet (81 mg total) by mouth daily.    Marland Kitchen. FIBER PO Take 1 capsule by mouth 3 (three) times daily.    Marland Kitchen. FLUoxetine (PROZAC) 20 MG capsule Take 20 mg by mouth 2 (two) times daily.    . hydrALAZINE (APRESOLINE) 25 MG tablet Take 1 tablet (25 mg total) by mouth every 8 (eight) hours. 90 tablet 0  . ipratropium (ATROVENT HFA) 17 MCG/ACT inhaler Inhale 1 puff into the lungs 2 (two) times daily as needed for wheezing.    Marland Kitchen. ipratropium-albuterol (DUONEB) 0.5-2.5 (3) MG/3ML SOLN Take 3 mLs by nebulization every 6 (six) hours. Dx: J44.9 360 mL 5  . isosorbide dinitrate (ISORDIL) 10 MG tablet Take 1 tablet (10 mg total) by mouth 3 (three) times daily. 90 tablet 0  . metoprolol succinate (TOPROL-XL) 25 MG 24 hr tablet Take 1 tablet (25 mg total) by mouth daily. 30 tablet 0  . Multiple Vitamins-Minerals (CENTRUM SILVER PO) Take 1 tablet by mouth daily.    Marland Kitchen. omeprazole (PRILOSEC) 20 MG capsule Take 20 mg by mouth daily.    Marland Kitchen. oxyCODONE (ROXICODONE) 15 MG immediate release tablet Take 15 mg  by mouth every 6 (six) hours as needed for pain.    . rosuvastatin (CRESTOR) 10 MG tablet Take 10 mg by mouth daily.    . sucroferric oxyhydroxide (VELPHORO) 500 MG chewable tablet Chew 500 mg by mouth twice daily with meals.    . Oxycodone HCl 10 MG TABS Take 10 mg by mouth every 6 (six) hours as needed (for pain).     No facility-administered medications prior to visit.      Allergies:   Patient has no known allergies.   Social History   Socioeconomic History  . Marital status: Unknown    Spouse name: None  . Number of children:  None  . Years of education: None  . Highest education level: None  Social Needs  . Financial resource strain: None  . Food insecurity - worry: None  . Food insecurity - inability: None  . Transportation needs - medical: None  . Transportation needs - non-medical: None  Occupational History  . Occupation: unemployed  Tobacco Use  . Smoking status: Former Games developermoker  . Smokeless tobacco: Never Used  Substance and Sexual Activity  . Alcohol use: No    Alcohol/week: 0.0 oz  . Drug use: No  . Sexual activity: No  Other Topics Concern  . None  Social History Narrative   ** Merged History Encounter **       Live alone. From [place]. No wife or children. Unemployed for the past 3 months. Use to work in Glass blower/designerconstruction/roofing.      Family History:  The patient's family history includes Hypertension in his father; Kidney disease in his father.   ROS:   Please see the history of present illness.    Wheezing, dyspnea, back discomfort, muscle pain, difficulty with balance, and unexplained weight gain. All other systems reviewed and are negative.   PHYSICAL EXAM:   VS:  BP 114/66   Pulse 66   Ht 5\' 5"  (1.651 m)   Wt 138 lb 12.8 oz (63 kg)   BMI 23.10 kg/m    GEN: Well nourished, well developed, in no acute distress  HEENT: normal  Neck: no JVD, or masses.  Left carotid bruit.  Evidence of right carotid endarterectomy. Cardiac: RRR; no murmurs, rubs, or gallops,no edema  Respiratory:  clear to auscultation bilaterally, normal work of breathing GI: soft, nontender, nondistended, + BS MS: no deformity or atrophy  Skin: warm and dry, no rash Neuro:  Alert and Oriented x 3, Strength and sensation are intact Psych: euthymic mood, full affect  Wt Readings from Last 3 Encounters:  12/16/16 138 lb 12.8 oz (63 kg)  11/30/16 139 lb 6.4 oz (63.2 kg)  09/17/16 135 lb (61.2 kg)      Studies/Labs Reviewed:   EKG:  EKG performed June 2018 demonstrates nonspecific interventricular  conduction delay and diffuse ST T wave abnormality unchanged from prior tracings.  Sinus rhythm is noted.  Recent Labs: 06/16/2016: TSH 3.696 06/19/2016: Magnesium 2.1 06/20/2016: ALT 14; BUN 25; Creatinine, Ser 4.86; Hemoglobin 8.9; Platelets 232; Potassium 3.8; Sodium 133   Lipid Panel No results found for: CHOL, TRIG, HDL, CHOLHDL, VLDL, LDLCALC, LDLDIRECT  Additional studies/ records that were reviewed today include:  ECHOCARDIOGRAM September 2018: Study Conclusions   - Left ventricle: The cavity size was normal. Wall thickness was   increased in a pattern of mild LVH. Systolic function was normal.   The estimated ejection fraction was in the range of 50% to 55%.   Wall motion was normal; there  were no regional wall motion   abnormalities. - Mitral valve: Severely calcified annulus. There was mild   regurgitation. - Left atrium: The atrium was severely dilated. - Right ventricle: The cavity size was mildly dilated. Wall   thickness was normal. - Right atrium: The atrium was mildly dilated. - Tricuspid valve: There was mild regurgitation. - Pulmonary arteries: Systolic pressure was moderately to severely   increased. PA peak pressure: 59 mm Hg (S).  Coronary angiography June 2018: Coronary Diagrams   Diagnostic Diagram           ASSESSMENT:    1. Coronary artery disease involving native coronary artery of native heart without angina pectoris   2. Cardiac arrest (HCC)   3. Chronic combined systolic and diastolic heart failure (HCC)   4. Essential hypertension      PLAN:  In order of problems listed above:  1. Non-obstructive coronary artery disease.  Asymptomatic. 2. He had a bradycardia asystolic cardiac arrest.  No recurrence.  Possibly neurologically mediated. 3. Systolic function is back to normal based upon echocardiography done earlier this year.  See echo results above.  No evidence of volume overload. 4. Excellent blood pressure control.  Physical  activity is encouraged.  He is encouraged to call if chest discomfort.  Clinical follow-up in 9-12 months.  No further cardiac evaluation at this time.  Medication Adjustments/Labs and Tests Ordered: Current medicines are reviewed at length with the patient today.  Concerns regarding medicines are outlined above.  Medication changes, Labs and Tests ordered today are listed in the Patient Instructions below. Patient Instructions  Medication Instructions:  Your physician recommends that you continue on your current medications as directed. Please refer to the Current Medication list given to you today.  Labwork: None  Testing/Procedures: None  Follow-Up: Your physician wants you to follow-up in: 9-12 months with Dr. Katrinka Blazing.  You will receive a reminder letter in the mail two months in advance. If you don't receive a letter, please call our office to schedule the follow-up appointment.   Any Other Special Instructions Will Be Listed Below (If Applicable).     If you need a refill on your cardiac medications before your next appointment, please call your pharmacy.      Signed, [name], MD  12/16/2016 9:33 AM    [facility] [address] [place], [place], [place]  [address] Phone: [phone]; Fax: [phone]

## 2016-12-16 ENCOUNTER — Ambulatory Visit (INDEPENDENT_AMBULATORY_CARE_PROVIDER_SITE_OTHER): Payer: Medicare Other | Admitting: Interventional Cardiology

## 2016-12-16 ENCOUNTER — Encounter: Payer: Self-pay | Admitting: Interventional Cardiology

## 2016-12-16 VITALS — BP 114/66 | HR 66 | Ht 65.0 in | Wt 138.8 lb

## 2016-12-16 DIAGNOSIS — I2510 Atherosclerotic heart disease of native coronary artery without angina pectoris: Secondary | ICD-10-CM

## 2016-12-16 DIAGNOSIS — I5042 Chronic combined systolic (congestive) and diastolic (congestive) heart failure: Secondary | ICD-10-CM | POA: Diagnosis not present

## 2016-12-16 DIAGNOSIS — I469 Cardiac arrest, cause unspecified: Secondary | ICD-10-CM | POA: Diagnosis not present

## 2016-12-16 DIAGNOSIS — I10 Essential (primary) hypertension: Secondary | ICD-10-CM

## 2016-12-16 NOTE — Patient Instructions (Signed)
Medication Instructions:  Your physician recommends that you continue on your current medications as directed. Please refer to the Current Medication list given to you today.  Labwork: None  Testing/Procedures: None  Follow-Up: Your physician wants you to follow-up in: 9-12 months with Dr. Smith.  You will receive a reminder letter in the mail two months in advance. If you don't receive a letter, please call our office to schedule the follow-up appointment.   Any Other Special Instructions Will Be Listed Below (If Applicable).     If you need a refill on your cardiac medications before your next appointment, please call your pharmacy.   

## 2017-02-02 ENCOUNTER — Encounter: Payer: Self-pay | Admitting: Pulmonary Disease

## 2017-02-02 ENCOUNTER — Telehealth: Payer: Self-pay | Admitting: Pulmonary Disease

## 2017-02-02 NOTE — Telephone Encounter (Signed)
[facility] per cpap DL Upcoming OV [phone] with prior PFT same day with VS Placed letter and DL in green folder in VS cubby for review

## 2017-02-08 ENCOUNTER — Ambulatory Visit: Payer: Medicare Other | Admitting: Pulmonary Disease

## 2017-04-06 ENCOUNTER — Telehealth: Payer: Self-pay | Admitting: Interventional Cardiology

## 2017-04-06 NOTE — Telephone Encounter (Signed)
Patient's wife is calling in regards to a visit with Dr. Ardelle ParkHaque.. Patient had an echo on Monday 3/25 and Dr Ardelle ParkHaque will be faxing results on 3/27 for Dr Katrinka BlazingSmith to review..  Dr Ardelle ParkHaque recommends a f/u appt  be made with Dr Katrinka BlazingSmith..  Please advise after reviewing Echo, thank you..Jonathan Terry

## 2017-04-06 NOTE — Telephone Encounter (Signed)
°  New message  Pt wife verbalized that she is calling for RN  [place], pt had a Echo done on 04-04-2017 and were suppose to send it to Dr.Smith  Please call pt or wife to discuss

## 2017-04-06 NOTE — Telephone Encounter (Signed)
We have not smoke received an echo report.

## 2017-04-06 NOTE — Telephone Encounter (Signed)
We are presently entering the Echo report into the system.. Thank you

## 2017-04-08 NOTE — Telephone Encounter (Signed)
See echo result note 

## 2017-04-10 NOTE — Telephone Encounter (Signed)
I believe we have dealt with this.

## 2017-05-26 ENCOUNTER — Other Ambulatory Visit: Payer: Self-pay | Admitting: *Deleted

## 2017-05-26 ENCOUNTER — Encounter: Payer: Self-pay | Admitting: *Deleted

## 2017-05-26 ENCOUNTER — Other Ambulatory Visit: Payer: Self-pay

## 2017-05-26 ENCOUNTER — Ambulatory Visit (INDEPENDENT_AMBULATORY_CARE_PROVIDER_SITE_OTHER): Payer: Medicare Other | Admitting: Physician Assistant

## 2017-05-26 VITALS — BP 165/66 | HR 54 | Temp 98.0°F | Resp 16 | Ht 65.0 in | Wt 141.1 lb

## 2017-05-26 DIAGNOSIS — N186 End stage renal disease: Secondary | ICD-10-CM

## 2017-05-26 DIAGNOSIS — Z992 Dependence on renal dialysis: Secondary | ICD-10-CM

## 2017-05-26 NOTE — Progress Notes (Signed)
HISTORY AND PHYSICAL     CC:  Aneurysmal fistula with scabs Requesting Provider:  Shelbie Ammons, MD  HPI: This is a 62 y.o. male who underwent a left RC AVF in January 2013 but in the recovery room it clotted and he was taken back to the OR and underwent a left BC AVF by Dr. Edilia Terry in January 2013 and subsequent ligation of branches in June 2013 by Dr. Hart Terry.  He returns today with complaints of scabs on his fistula.  He has two aneurysmal areas and both having thinning skin with a scab on each.  He states that they have been present for 2-3 weeks.  They have not bled.  He states that one of the tech's stuck the top of the fistula a couple of weeks ago and the scab developed.  He says they usually stick around the side of the fistula.    Per the notes sent over, He has had angioplasty of cephalic vein stenosis at [facility] in 2018 as well as a fistulogram in 2016 at [facility] where he had a balloon angioplasty of a focal venous stenosis in the left upper arm.  He dialyzes M/W/F in [place].  He states he takes an aspirin daily.  The pt is on a statin for cholesterol management.  He is on a beta blocker daily.  Hx of carotid artery stenosis and looks to be followed at [facility].  His father is living at age 63 and has kidney disease but not on dialysis.   His primary language is Spanish and he is here with an interpreter, however, he does speak and understand English relatively well.   Past Medical History:  Diagnosis Date  . Anemia 01/29/2011  . Anemia   . Anginal pain (HCC)    none recently since started dialysis  . Congestive heart failure (HCC) 01/29/2011   none since on dialysis  . CVA (cerebral vascular accident) (HCC)   . Dizziness 606/05/13  . ESRD (end stage renal disease) on dialysis (HCC)   . GERD (gastroesophageal reflux disease)   . Hypertension   . Renal failure (ARF), acute on chronic (HCC)   . Tobacco use     Past Surgical History:  Procedure Laterality  Date  . AV FISTULA PLACEMENT  01/28/2011   Procedure: ARTERIOVENOUS (AV) FISTULA CREATION;  Surgeon: Pryor Ochoa, MD;  Location: [facility] OR;  Service: Vascular;  Laterality: Left;  . AV FISTULA PLACEMENT  01/28/2011   Procedure: ARTERIOVENOUS (AV) FISTULA CREATION;  Surgeon: Chuck Hint, MD;  Location[facility] OR;  Service: Vascular;;  . INTRAVASCULAR PRESSURE WIRE/FFR STUDY N/A 06/17/2016   Procedure: Intravascular Pressure Wire/FFR Study;  Surgeon: Kathleene Hazel, MD;  [facility] INV[facility] LAB;  Service: Cardiovascular;  Laterality: N/A;  . RIGHT/LEFT HEART CATH AND CORONARY ANGIOGRAPHY N/A 06/17/2016   Procedure: Right/Left Heart Cath and Coronary Angiography;  Surgeon: Kathleene Hazel, MD;  [facility]: [facility] INV[facility] LAB;  Service: Cardiovascular;  Laterality: N/A;    No Known Allergies  Current Outpatient Medications  Medication Sig Dispense Refill  . aspirin EC 81 MG EC tablet Take 1 tablet (81 mg total) by mouth daily.    Marland Kitchen FIBER PO Take 1 capsule by mouth 3 (three) times daily.    Marland Kitchen FLUoxetine (PROZAC) 20 MG capsule Take 20 mg by mouth 2 (two) times daily.    . hydrALAZINE (APRESOLINE) 25 MG tablet Take 1 tablet (25 mg total) by mouth every 8 (eight) hours. 90  tablet 0  . ipratropium (ATROVENT HFA) 17 MCG/ACT inhaler Inhale 1 puff into the lungs 2 (two) times daily as needed for wheezing.    Marland Kitchen ipratropium-albuterol (DUONEB) 0.5-2.5 (3) MG/3ML SOLN Take 3 mLs by nebulization every 6 (six) hours. Dx: J44.9 360 mL 5  . isosorbide dinitrate (ISORDIL) 10 MG tablet Take 1 tablet (10 mg total) by mouth 3 (three) times daily. 90 tablet 0  . metoprolol succinate (TOPROL-XL) 25 MG 24 hr tablet Take 1 tablet (25 mg total) by mouth daily. 30 tablet 0  . Multiple Vitamins-Minerals (CENTRUM SILVER PO) Take 1 tablet by mouth daily.    Marland Kitchen omeprazole (PRILOSEC) 20 MG capsule Take 20 mg by mouth daily.    Marland Kitchen oxyCODONE (ROXICODONE) 15 MG immediate release tablet Take 15 mg by mouth every  6 (six) hours as needed for pain.    . rosuvastatin (CRESTOR) 10 MG tablet Take 10 mg by mouth daily.    . sucroferric oxyhydroxide (VELPHORO) 500 MG chewable tablet Chew 500 mg by mouth twice daily with meals.     No current facility-administered medications for this visit.     Family History  Problem Relation Age of Onset  . Hypertension Father   . Kidney disease Father     Social History   Socioeconomic History  . Marital status: Unknown    Spouse name: Not on file  . Number of children: Not on file  . Years of education: Not on file  . Highest education level: Not on file  Occupational History  . Occupation: unemployed  Social Needs  . Financial resource strain: Not on file  . Food insecurity:    Worry: Not on file    Inability: Not on file  . Transportation needs:    Medical: Not on file    Non-medical: Not on file  Tobacco Use  . Smoking status: Former Games developer  . Smokeless tobacco: Never Used  Substance and Sexual Activity  . Alcohol use: No    Alcohol/week: 0.0 oz  . Drug use: No  . Sexual activity: Never  Lifestyle  . Physical activity:    Days per week: Not on file    Minutes per session: Not on file  . Stress: Not on file  Relationships  . Social connections:    Talks on phone: Not on file    Gets together: Not on file    Attends religious service: Not on file    Active member of club or organization: Not on file    Attends meetings of clubs or organizations: Not on file    Relationship status: Not on file  . Intimate partner violence:    Fear of current or ex partner: Not on file    Emotionally abused: Not on file    Physically abused: Not on file    Forced sexual activity: Not on file  Other Topics Concern  . Not on file  Social History Narrative   ** Merged History Encounter **       Live alone. From [place]. No wife or children. Unemployed for the past 3 months. Use to work in Glass blower/designer.      REVIEW OF SYSTEMS:    denotes  positive finding,  denotes negative finding Cardiac  Comments:  CHF/CAD/HTN x   Vascular    Aneurysmal area and scabs on fistula x   Pulmonary    OSA x   Neurologic    Hx CVA    Gastrointestinal    Hx  barrett's esphagus x   Genitourinary    Burning when urinating:     Blood in urine:    Psychiatric    Major depression:     Hematologic    Bleeding problems:    Problems with blood clotting too easily:    anemia x   Skin    Rashes or ulcers: x See HPI  Constitutional    Fever or chills:      PHYSICAL EXAMINATION: Vitals:   05/26/17 1340 05/26/17 1343  BP: (!) 162/68 (!) 165/66  Pulse: (!) 53 (!) 54  Resp: 16   Temp: 98 F (36.7 C)   SpO2: 100%    Vitals:   05/26/17 1340  Weight: 141 lb 1.5 oz (64 kg)  Height:  (1.651 m)    General:  WDWN in NAD; vital signs documented above Gait: Not observed HENT: WNL, normocephalic Pulmonary: normal non-labored breathing , without Rales, rhonchi,  wheezing Cardiac: regular HR, without  Murmurs without carotid bruits Abdomen: soft, NT, no masses Skin: without rashes Vascular Exam/Pulses:  Right Left  Radial 2+ (normal) 2+ (normal)  Ulnar Unable to palpate  Unable to palpate    Extremities: fistula with good thrill; there are two aneurysmal areas with a scab on each with thinning skin. Musculoskeletal: no muscle wasting or atrophy  Neurologic: A&O X 3;  No focal weakness or paresthesias are detected Psychiatric:  The pt has Normal affect.   Non-Invasive Vascular Imaging:   None today  Pt meds includes: Statin:  Yes.   Beta Blocker:  Yes.   Aspirin:  Yes.   ACEI:  No. ARB:  No. CCB use:  No Other Antiplatelet/Anticoagulant:  No   ASSESSMENT/PLAN:: 62 y.o. male with ESRD now with 2 aneurysmal areas on his fistula each with a scab and thinning skin.   -these two areas have been present for 2-3 weeks and have not yet bled.  Pt seen by Dr. Darrick Penna who feels the pt needs to have these repaired as he is at  increased risk for bleeding.  Discussed this with pt via interpreter and that he may need a tunneled dialysis catheter if there is not enough room to stick the fistula after plication.  He will be put on the schedule for Tuesday, May 31, 2017.   -also discussed with pt that if these areas were to bleed between now and then, he is to hold pressure (showed him where to hold) and to call 911.  -he takes a daily aspirin.   Doreatha Massed, PA-C [facility] [phone]  Clinic MD:  Pt seen and examined with Dr. Darrick Penna

## 2017-05-26 NOTE — H&P (View-Only) (Signed)
HISTORY AND PHYSICAL     CC:  Aneurysmal fistula with scabs Requesting Provider:  Haque, Imran P, MD  HPI: This is a Jonathan Terry who underwent a left RC AVF in January 2013 but in the recovery room it clotted and he was taken back to the OR and underwent a left BC AVF by Dr. Dickson in January 2013 and subsequent ligation of branches in June 2013 by Dr. Lawson.  He returns today with complaints of scabs on his fistula.  He has two aneurysmal areas and both having thinning skin with a scab on each.  He states that they have been present for 2-3 weeks.  They have not bled.  He states that one of the tech's stuck the top of the fistula a couple of weeks ago and the scab developed.  He says they usually stick around the side of the fistula.    Per the notes sent over, He has had angioplasty of cephalic vein stenosis at [facility] in 2018 as well as a fistulogram in 2016 at [facility] where he had a balloon angioplasty of a focal venous stenosis in the left upper arm.  He dialyzes M/W/F in [place].  He states he takes an aspirin daily.  The pt is on a statin for cholesterol management.  He is on a beta blocker daily.  Hx of carotid artery stenosis and looks to be followed at [facility].  His father is living at age 97 and has kidney disease but not on dialysis.   His primary language is Spanish and he is here with an interpreter, however, he does speak and understand English relatively well.   Past Medical History:  Diagnosis Date  . Anemia 01/29/2011  . Anemia   . Anginal pain (HCC)    none recently since started dialysis  . Congestive heart failure (HCC) 01/29/2011   none since on dialysis  . CVA (cerebral vascular accident) (HCC)   . Dizziness 606/05/13  . ESRD (end stage renal disease) on dialysis (HCC)   . GERD (gastroesophageal reflux disease)   . Hypertension   . Renal failure (ARF), acute on chronic (HCC)   . Tobacco use     Past Surgical History:  Procedure Laterality  Date  . AV FISTULA PLACEMENT  01/28/2011   Procedure: ARTERIOVENOUS (AV) FISTULA CREATION;  Surgeon: James D Lawson, MD;  Location: [facility] OR;  Service: Vascular;  Laterality: Left;  . AV FISTULA PLACEMENT  01/28/2011   Procedure: ARTERIOVENOUS (AV) FISTULA CREATION;  Surgeon: Christopher S Dickson, MD;  Location: [facility] OR;  Service: Vascular;;  . INTRAVASCULAR PRESSURE WIRE/FFR STUDY N/A 06/17/2016   Procedure: Intravascular Pressure Wire/FFR Study;  Surgeon: McAlhany, Christopher D, MD;  Location[facility] LAB;  Service: Cardiovascular;  Laterality: N/A;  . RIGHT/LEFT HEART CATH AND CORONARY ANGIOGRAPHY N/A 06/17/2016   Procedure: Right/Left Heart Cath and Coronary Angiography;  Surgeon: McAlhany, Christopher D, MD;  [facility];  Service: Cardiovascular;  Laterality: N/A;    No Known Allergies  Current Outpatient Medications  Medication Sig Dispense Refill  . aspirin EC 81 MG EC tablet Take 1 tablet (81 mg total) by mouth daily.    . FIBER PO Take 1 capsule by mouth 3 (three) times daily.    . FLUoxetine (PROZAC) 20 MG capsule Take 20 mg by mouth 2 (two) times daily.    . hydrALAZINE (APRESOLINE) 25 MG tablet Take 1 tablet (25 mg total) by mouth every 8 (eight) hours. 90   tablet 0  . ipratropium (ATROVENT HFA) 17 MCG/ACT inhaler Inhale 1 puff into the lungs 2 (two) times daily as needed for wheezing.    . ipratropium-albuterol (DUONEB) 0.5-2.5 (3) MG/3ML SOLN Take 3 mLs by nebulization every 6 (six) hours. Dx: J44.9 360 mL 5  . isosorbide dinitrate (ISORDIL) 10 MG tablet Take 1 tablet (10 mg total) by mouth 3 (three) times daily. 90 tablet 0  . metoprolol succinate (TOPROL-XL) 25 MG 24 hr tablet Take 1 tablet (25 mg total) by mouth daily. 30 tablet 0  . Multiple Vitamins-Minerals (CENTRUM SILVER PO) Take 1 tablet by mouth daily.    . omeprazole (PRILOSEC) 20 MG capsule Take 20 mg by mouth daily.    . oxyCODONE (ROXICODONE) 15 MG immediate release tablet Take 15 mg by mouth every  6 (six) hours as needed for pain.    . rosuvastatin (CRESTOR) 10 MG tablet Take 10 mg by mouth daily.    . sucroferric oxyhydroxide (VELPHORO) 500 MG chewable tablet Chew 500 mg by mouth twice daily with meals.     No current facility-administered medications for this visit.     Family History  Problem Relation Age of Onset  . Hypertension Father   . Kidney disease Father     Social History   Socioeconomic History  . Marital status: Unknown    Spouse name: Not on file  . Number of children: Not on file  . Years of education: Not on file  . Highest education level: Not on file  Occupational History  . Occupation: unemployed  Social Needs  . Financial resource strain: Not on file  . Food insecurity:    Worry: Not on file    Inability: Not on file  . Transportation needs:    Medical: Not on file    Non-medical: Not on file  Tobacco Use  . Smoking status: Former Smoker  . Smokeless tobacco: Never Used  Substance and Sexual Activity  . Alcohol use: No    Alcohol/week: 0.0 oz  . Drug use: No  . Sexual activity: Never  Lifestyle  . Physical activity:    Days per week: Not on file    Minutes per session: Not on file  . Stress: Not on file  Relationships  . Social connections:    Talks on phone: Not on file    Gets together: Not on file    Attends religious service: Not on file    Active member of club or organization: Not on file    Attends meetings of clubs or organizations: Not on file    Relationship status: Not on file  . Intimate partner violence:    Fear of current or ex partner: Not on file    Emotionally abused: Not on file    Physically abused: Not on file    Forced sexual activity: Not on file  Other Topics Concern  . Not on file  Social History Narrative   ** Merged History Encounter **       Live alone. From [place]. No wife or children. Unemployed for the past 3 months. Use to work in construction/roofing.      REVIEW OF SYSTEMS:   [X] denotes  positive finding, [ ] denotes negative finding Cardiac  Comments:  CHF/CAD/HTN x   Vascular    Aneurysmal area and scabs on fistula x   Pulmonary    OSA x   Neurologic    Hx CVA    Gastrointestinal    Hx   barrett's esphagus x   Genitourinary    Burning when urinating:     Blood in urine:    Psychiatric    Major depression:     Hematologic    Bleeding problems:    Problems with blood clotting too easily:    anemia x   Skin    Rashes or ulcers: x See HPI  Constitutional    Fever or chills:      PHYSICAL EXAMINATION: Vitals:   05/26/17 1340 05/26/17 1343  BP: (!) 162/68 (!) 165/66  Pulse: (!) 53 (!) 54  Resp: 16   Temp: 98 F (36.7 C)   SpO2: 100%    Vitals:   05/26/17 1340  Weight: 141 lb 1.5 oz (Jonathan kg)  Height: 5' 5" (1.651 m)    General:  WDWN in NAD; vital signs documented above Gait: Not observed HENT: WNL, normocephalic Pulmonary: normal non-labored breathing , without Rales, rhonchi,  wheezing Cardiac: regular HR, without  Murmurs without carotid bruits Abdomen: soft, NT, no masses Skin: without rashes Vascular Exam/Pulses:  Right Left  Radial 2+ (normal) 2+ (normal)  Ulnar Unable to palpate  Unable to palpate    Extremities: fistula with good thrill; there are two aneurysmal areas with a scab on each with thinning skin. Musculoskeletal: no muscle wasting or atrophy  Neurologic: A&O X 3;  No focal weakness or paresthesias are detected Psychiatric:  The pt has Normal affect.   Non-Invasive Vascular Imaging:   None today  Pt meds includes: Statin:  Yes.   Beta Blocker:  Yes.   Aspirin:  Yes.   ACEI:  No. ARB:  No. CCB use:  No Other Antiplatelet/Anticoagulant:  No   ASSESSMENT/PLAN:: Jonathan Terry with ESRD now with 2 aneurysmal areas on his fistula each with a scab and thinning skin.   -these two areas have been present for 2-3 weeks and have not yet bled.  Pt seen by Dr. Fields who feels the pt needs to have these repaired as he is at  increased risk for bleeding.  Discussed this with pt via interpreter and that he may need a tunneled dialysis catheter if there is not enough room to stick the fistula after plication.  He will be put on the schedule for Tuesday, May 31, 2017.   -also discussed with pt that if these areas were to bleed between now and then, he is to hold pressure (showed him where to hold) and to call 911.  -he takes a daily aspirin.   Jourdan Durbin, PA-C [facility] [phone]  Clinic MD:  Pt seen and examined with Dr. Fields  

## 2017-05-27 ENCOUNTER — Encounter (HOSPITAL_COMMUNITY): Payer: Self-pay | Admitting: *Deleted

## 2017-05-27 ENCOUNTER — Other Ambulatory Visit: Payer: Self-pay

## 2017-05-27 NOTE — Progress Notes (Signed)
Anesthesia Chart Review:  Pt is a same day work up   Case:  [id number] Date/Time:  05/31/17 1159   Procedures:      PLICATION OF ARTERIOVENOUS FISTULA LEFT ARM (Left )     INSERTION OF DIALYSIS CATHETER (N/A )   Anesthesia type:  Monitor Anesthesia Care   Pre-op diagnosis:  COMPLICATION WITH FISTULA   [facility] / [facility]   Surgeon:  Chuck Hint, MD      DISCUSSION:  - Pt is a 62 year old male with hx PEA arrest 06/2016 (bradycardia asystolic cardiac arrest, likely related to acute hypoxia in setting of volume overload with hx of ESRD and pulmonary HTN), moderate CAD (except severe disease in PDA, medically managed), chronic combined systolic and diastolic HF, ESRD on dialysis, COPD, OSA, and moderate to severe pulmonary hypertension   PROVIDERS: PCP is Shelbie Ammons, MD Cardiologist is Verdis Prime, MD. Last office visit 12/16/16 Pulmonologist is Coralyn Helling, MD. Last office visit 11/30/16 with Rubye Oaks, NP   LABS: Will be obtained day of surgery    EKG 06/18/16: NSR. ST & T wave abnormality, consider inferior ischemia. Prolonged QT   CV:  Echo 04/04/17:  1.  Mild concentric LVH.  Mildly reduced LV function.  EF 45-50%.  Grade 2 diastolic dysfunction consistent with impaired relaxation and mild to moderate increase in filling pressures. 2.  Moderate LA enlargement. 3.  Moderate mitral annular calcification. 4.  Moderate RA enlargement. 5.  Mild mitral valve regurgitation. 6.  Moderate tricuspid valve regurgitation. 7.  Moderate pulmonic valve regurgitation.  Cardiac cath 06/17/16:  1. Triple vessel CAD 2. Mid LAD has 60% stenosis. (FFR evaluation of this lesion suggested it was not flow limiting). D1 (70%) and D2 (60%) have moderate ostial stenosis. 3. CX: Both OM branches have moderate (50%) ostial stenosis.  4. RCA is a large dominant vessel. Mid RCA 20% stenosis. The early arising PDA branch has severe ostial (99%) stenosis with excellent flow down  the vessel. There is no landing zone for a stent. Stent placement in this vessel could compromise flow into the large posterolateral branch.  5. Moderately severe pulmonary HTN    Past Medical History:  Diagnosis Date  . Anemia 01/29/2011  . Anemia   . Anginal pain (HCC)    none recently since started dialysis  . Congestive heart failure (HCC) 01/29/2011   none since on dialysis  . CVA (cerebral vascular accident) (HCC)   . Dizziness 606/05/13  . ESRD (end stage renal disease) on dialysis (HCC)   . GERD (gastroesophageal reflux disease)   . Hypertension   . Renal failure (ARF), acute on chronic (HCC)   . Tobacco use     Past Surgical History:  Procedure Laterality Date  . AV FISTULA PLACEMENT  01/28/2011   Procedure: ARTERIOVENOUS (AV) FISTULA CREATION;  Surgeon: Pryor Ochoa, MD;  Location: [facility] OR;  Service: Vascular;  Laterality: Left;  . AV FISTULA PLACEMENT  01/28/2011   Procedure: ARTERIOVENOUS (AV) FISTULA CREATION;  Surgeon: Chuck Hint, MD;  Location: [facility] OR;  Service: Vascular;;  . INTRAVASCULAR PRESSURE WIRE/FFR STUDY N/A 06/17/2016   Procedure: Intravascular Pressure Wire/FFR Study;  Surgeon: Kathleene Hazel, MD;  [facility] LAB;  Service: Cardiovascular;  Laterality: N/A;  . RIGHT/LEFT HEART CATH AND CORONARY ANGIOGRAPHY N/A 06/17/2016   Procedure: Right/Left Heart Cath and Coronary Angiography;  Surgeon: Kathleene Hazel, MD;  [facility];  Service: Cardiovascular;  Laterality: N/A;    MEDICATIONS: No current facility-administered medications for this encounter.    . rosuvastatin (CRESTOR) 10 MG tablet  . aspirin EC 81 MG EC tablet  . FIBER PO  . FLUoxetine (PROZAC) 20 MG capsule  . hydrALAZINE (APRESOLINE) 25 MG tablet  . ipratropium (ATROVENT HFA) 17 MCG/ACT inhaler  . ipratropium-albuterol (DUONEB) 0.5-2.5 (3) MG/3ML SOLN  . isosorbide dinitrate (ISORDIL) 10 MG tablet  . metoprolol succinate (TOPROL-XL) 25 MG  24 hr tablet  . Multiple Vitamins-Minerals (CENTRUM SILVER PO)  . omeprazole (PRILOSEC) 20 MG capsule  . oxyCODONE (ROXICODONE) 15 MG immediate release tablet  . sucroferric oxyhydroxide (VELPHORO) 500 MG chewable tablet    If labs acceptable day of surgery, I anticipate pt can proceed with surgery as scheduled.  Rica Mast, FNP-BC [facility] Stay [facility]/Anesthesiology Phone: [phone] 05/27/2017 2:36 PM

## 2017-05-31 ENCOUNTER — Encounter: Payer: Self-pay | Admitting: Nephrology

## 2017-05-31 ENCOUNTER — Encounter (HOSPITAL_COMMUNITY)
Admission: RE | Disposition: A | Discharge status: Home / Self Care | Payer: Self-pay | Source: Ambulatory Visit | Attending: Vascular Surgery

## 2017-05-31 ENCOUNTER — Ambulatory Visit (HOSPITAL_COMMUNITY): Payer: Medicare Other | Admitting: Emergency Medicine

## 2017-05-31 ENCOUNTER — Other Ambulatory Visit: Payer: Self-pay

## 2017-05-31 ENCOUNTER — Encounter (HOSPITAL_COMMUNITY): Payer: Self-pay | Admitting: *Deleted

## 2017-05-31 ENCOUNTER — Ambulatory Visit (HOSPITAL_COMMUNITY)
Admission: RE | Admit: 2017-05-31 | Discharge: 2017-05-31 | Disposition: A | Discharge status: Home / Self Care | Payer: Medicare Other | Source: Ambulatory Visit | Attending: Vascular Surgery | Admitting: Vascular Surgery

## 2017-05-31 DIAGNOSIS — G4730 Sleep apnea, unspecified: Secondary | ICD-10-CM | POA: Insufficient documentation

## 2017-05-31 DIAGNOSIS — T82898A Other specified complication of vascular prosthetic devices, implants and grafts, initial encounter: Secondary | ICD-10-CM | POA: Insufficient documentation

## 2017-05-31 DIAGNOSIS — R42 Dizziness and giddiness: Secondary | ICD-10-CM | POA: Diagnosis not present

## 2017-05-31 DIAGNOSIS — I509 Heart failure, unspecified: Secondary | ICD-10-CM | POA: Diagnosis not present

## 2017-05-31 DIAGNOSIS — X58XXXA Exposure to other specified factors, initial encounter: Secondary | ICD-10-CM | POA: Insufficient documentation

## 2017-05-31 DIAGNOSIS — Z7982 Long term (current) use of aspirin: Secondary | ICD-10-CM | POA: Insufficient documentation

## 2017-05-31 DIAGNOSIS — Z841 Family history of disorders of kidney and ureter: Secondary | ICD-10-CM | POA: Insufficient documentation

## 2017-05-31 DIAGNOSIS — I25119 Atherosclerotic heart disease of native coronary artery with unspecified angina pectoris: Secondary | ICD-10-CM | POA: Insufficient documentation

## 2017-05-31 DIAGNOSIS — Z87891 Personal history of nicotine dependence: Secondary | ICD-10-CM | POA: Diagnosis not present

## 2017-05-31 DIAGNOSIS — I132 Hypertensive heart and chronic kidney disease with heart failure and with stage 5 chronic kidney disease, or end stage renal disease: Secondary | ICD-10-CM | POA: Insufficient documentation

## 2017-05-31 DIAGNOSIS — Z992 Dependence on renal dialysis: Secondary | ICD-10-CM | POA: Diagnosis not present

## 2017-05-31 DIAGNOSIS — Z79899 Other long term (current) drug therapy: Secondary | ICD-10-CM | POA: Insufficient documentation

## 2017-05-31 DIAGNOSIS — N186 End stage renal disease: Secondary | ICD-10-CM | POA: Diagnosis not present

## 2017-05-31 DIAGNOSIS — Z8673 Personal history of transient ischemic attack (TIA), and cerebral infarction without residual deficits: Secondary | ICD-10-CM | POA: Diagnosis not present

## 2017-05-31 DIAGNOSIS — D631 Anemia in chronic kidney disease: Secondary | ICD-10-CM | POA: Diagnosis not present

## 2017-05-31 DIAGNOSIS — J449 Chronic obstructive pulmonary disease, unspecified: Secondary | ICD-10-CM | POA: Insufficient documentation

## 2017-05-31 DIAGNOSIS — Z8249 Family history of ischemic heart disease and other diseases of the circulatory system: Secondary | ICD-10-CM | POA: Insufficient documentation

## 2017-05-31 DIAGNOSIS — K219 Gastro-esophageal reflux disease without esophagitis: Secondary | ICD-10-CM | POA: Diagnosis not present

## 2017-05-31 HISTORY — DX: Other chronic pain: G89.29

## 2017-05-31 HISTORY — PX: FISTULA SUPERFICIALIZATION: SHX6341

## 2017-05-31 HISTORY — DX: Dorsalgia, unspecified: M54.9

## 2017-05-31 HISTORY — DX: Unspecified osteoarthritis, unspecified site: M19.90

## 2017-05-31 LAB — POCT I-STAT 4, (NA,K, GLUC, HGB,HCT)
Glucose, Bld: 87 mg/dL (ref 65–99)
HEMATOCRIT: 36 % — AB (ref 39.0–52.0)
HEMOGLOBIN: 12.2 g/dL — AB (ref 13.0–17.0)
POTASSIUM: 4.4 mmol/L (ref 3.5–5.1)
SODIUM: 141 mmol/L (ref 135–145)

## 2017-05-31 SURGERY — FISTULA SUPERFICIALIZATION
Anesthesia: Monitor Anesthesia Care

## 2017-05-31 MED ORDER — HEPARIN SODIUM (PORCINE) 1000 UNIT/ML IJ SOLN
INTRAMUSCULAR | Status: DC | PRN
  Administered 2017-05-31: 6000 [IU] via INTRAVENOUS

## 2017-05-31 MED ORDER — HEPARIN SODIUM (PORCINE) 5000 UNIT/ML IJ SOLN
INTRAMUSCULAR | Status: DC | PRN
  Administered 2017-05-31: 13:00:00

## 2017-05-31 MED ORDER — MIDAZOLAM HCL 2 MG/2ML IJ SOLN
INTRAMUSCULAR | Status: AC
  Filled 2017-05-31: qty 2

## 2017-05-31 MED ORDER — PROTAMINE SULFATE 10 MG/ML IV SOLN
INTRAVENOUS | Status: DC | PRN
  Administered 2017-05-31: 40 mg via INTRAVENOUS

## 2017-05-31 MED ORDER — HEPARIN SODIUM (PORCINE) 1000 UNIT/ML IJ SOLN
INTRAMUSCULAR | Status: AC
  Filled 2017-05-31: qty 1

## 2017-05-31 MED ORDER — SODIUM CHLORIDE 0.9 % IV SOLN
INTRAVENOUS | Status: DC
  Administered 2017-05-31 (×3): via INTRAVENOUS

## 2017-05-31 MED ORDER — FENTANYL CITRATE (PF) 250 MCG/5ML IJ SOLN
INTRAMUSCULAR | Status: AC
  Filled 2017-05-31: qty 5

## 2017-05-31 MED ORDER — LIDOCAINE HCL (PF) 1 % IJ SOLN
INTRAMUSCULAR | Status: AC
  Filled 2017-05-31: qty 30

## 2017-05-31 MED ORDER — PROPOFOL 500 MG/50ML IV EMUL
INTRAVENOUS | Status: DC | PRN
  Administered 2017-05-31: 50 ug/kg/min via INTRAVENOUS

## 2017-05-31 MED ORDER — LIDOCAINE-EPINEPHRINE 1 %-1:200000 IJ SOLN
INTRAMUSCULAR | Status: AC
Start: 2017-05-31 — End: ?
  Filled 2017-05-31: qty 30

## 2017-05-31 MED ORDER — ONDANSETRON HCL 4 MG/2ML IJ SOLN
INTRAMUSCULAR | Status: AC
  Filled 2017-05-31: qty 2

## 2017-05-31 MED ORDER — LIDOCAINE 2% (20 MG/ML) 5 ML SYRINGE
INTRAMUSCULAR | Status: AC
  Filled 2017-05-31: qty 5

## 2017-05-31 MED ORDER — SODIUM CHLORIDE 0.9 % IV SOLN
INTRAVENOUS | Status: AC
  Filled 2017-05-31: qty 1.2

## 2017-05-31 MED ORDER — CEFAZOLIN SODIUM-DEXTROSE 2-4 GM/100ML-% IV SOLN
INTRAVENOUS | Status: AC
  Filled 2017-05-31: qty 100

## 2017-05-31 MED ORDER — DEXAMETHASONE SODIUM PHOSPHATE 10 MG/ML IJ SOLN
INTRAMUSCULAR | Status: DC | PRN
  Administered 2017-05-31: 5 mg via INTRAVENOUS

## 2017-05-31 MED ORDER — LIDOCAINE-EPINEPHRINE (PF) 1 %-1:200000 IJ SOLN
INTRAMUSCULAR | Status: DC | PRN
  Administered 2017-05-31: 30 mL

## 2017-05-31 MED ORDER — FENTANYL CITRATE (PF) 100 MCG/2ML IJ SOLN
INTRAMUSCULAR | Status: DC | PRN
  Administered 2017-05-31 (×4): 25 ug via INTRAVENOUS

## 2017-05-31 MED ORDER — 0.9 % SODIUM CHLORIDE (POUR BTL) OPTIME
TOPICAL | Status: DC | PRN
  Administered 2017-05-31: 1000 mL

## 2017-05-31 MED ORDER — MIDAZOLAM HCL 5 MG/5ML IJ SOLN
INTRAMUSCULAR | Status: DC | PRN
  Administered 2017-05-31: 2 mg via INTRAVENOUS

## 2017-05-31 MED ORDER — DEXAMETHASONE SODIUM PHOSPHATE 10 MG/ML IJ SOLN
INTRAMUSCULAR | Status: AC
  Filled 2017-05-31: qty 1

## 2017-05-31 MED ORDER — LIDOCAINE HCL (CARDIAC) PF 100 MG/5ML IV SOSY
PREFILLED_SYRINGE | INTRAVENOUS | Status: DC | PRN
  Administered 2017-05-31: 60 mg via INTRATRACHEAL

## 2017-05-31 MED ORDER — PROTAMINE SULFATE 10 MG/ML IV SOLN
INTRAVENOUS | Status: AC
  Filled 2017-05-31: qty 5

## 2017-05-31 MED ORDER — CHLORHEXIDINE GLUCONATE 4 % EX LIQD: 60.0000 mL | Freq: Once | CUTANEOUS | Status: DC

## 2017-05-31 MED ORDER — CEFAZOLIN SODIUM-DEXTROSE 2-4 GM/100ML-% IV SOLN
2.0000 g | INTRAVENOUS | Status: AC
  Administered 2017-05-31: 2 g via INTRAVENOUS

## 2017-05-31 MED ORDER — PROPOFOL 10 MG/ML IV BOLUS
INTRAVENOUS | Status: AC
  Filled 2017-05-31: qty 20

## 2017-05-31 SURGICAL SUPPLY — 56 items
ARMBAND PINK RESTRICT EXTREMIT (MISCELLANEOUS) ×4 IMPLANT
BAG DECANTER FOR FLEXI CONT (MISCELLANEOUS) ×4 IMPLANT
BIOPATCH RED 1 DISK 7.0 (GAUZE/BANDAGES/DRESSINGS) IMPLANT
BIOPATCH RED 1IN DISK 7.0MM (GAUZE/BANDAGES/DRESSINGS)
CANISTER SUCT 3000ML PPV (MISCELLANEOUS) ×4 IMPLANT
CANNULA VESSEL 3MM 2 BLNT TIP (CANNULA) ×4 IMPLANT
CATH PALINDROME RT-P 15FX19CM (CATHETERS) IMPLANT
CATH PALINDROME RT-P 15FX23CM (CATHETERS) IMPLANT
CATH PALINDROME RT-P 15FX28CM (CATHETERS) IMPLANT
CATH PALINDROME RT-P 15FX55CM (CATHETERS) IMPLANT
CHLORAPREP W/TINT 26ML (MISCELLANEOUS) IMPLANT
CLIP VESOCCLUDE MED 6/CT (CLIP) ×4 IMPLANT
CLIP VESOCCLUDE SM WIDE 6/CT (CLIP) ×4 IMPLANT
COVER PROBE W GEL 5X96 (DRAPES) IMPLANT
COVER SURGICAL LIGHT HANDLE (MISCELLANEOUS) IMPLANT
DECANTER SPIKE VIAL GLASS SM (MISCELLANEOUS) ×4 IMPLANT
DERMABOND ADVANCED (GAUZE/BANDAGES/DRESSINGS) ×2
DERMABOND ADVANCED .7 DNX12 (GAUZE/BANDAGES/DRESSINGS) ×2 IMPLANT
DRAPE C-ARM 42X72 X-RAY (DRAPES) IMPLANT
DRAPE CHEST BREAST 15X10 FENES (DRAPES) IMPLANT
ELECT REM PT RETURN 9FT ADLT (ELECTROSURGICAL) ×4
ELECTRODE REM PT RTRN 9FT ADLT (ELECTROSURGICAL) ×2 IMPLANT
GAUZE SPONGE 4X4 16PLY XRAY LF (GAUZE/BANDAGES/DRESSINGS) IMPLANT
GLOVE BIO SURGEON STRL SZ7.5 (GLOVE) ×4 IMPLANT
GLOVE BIOGEL PI IND STRL 6.5 (GLOVE) ×4 IMPLANT
GLOVE BIOGEL PI IND STRL 8 (GLOVE) ×2 IMPLANT
GLOVE BIOGEL PI INDICATOR 6.5 (GLOVE) ×4
GLOVE BIOGEL PI INDICATOR 8 (GLOVE) ×2
GOWN STRL REUS W/ TWL LRG LVL3 (GOWN DISPOSABLE) ×6 IMPLANT
GOWN STRL REUS W/ TWL XL LVL3 (GOWN DISPOSABLE) ×4 IMPLANT
GOWN STRL REUS W/TWL LRG LVL3 (GOWN DISPOSABLE) ×6
GOWN STRL REUS W/TWL XL LVL3 (GOWN DISPOSABLE) ×4
KIT BASIN OR (CUSTOM PROCEDURE TRAY) ×4 IMPLANT
KIT TURNOVER KIT B (KITS) ×4 IMPLANT
NEEDLE 18GX1X1/2 (RX/OR ONLY) (NEEDLE) IMPLANT
NEEDLE HYPO 25GX1X1/2 BEV (NEEDLE) ×4 IMPLANT
NS IRRIG 1000ML POUR BTL (IV SOLUTION) ×4 IMPLANT
PACK CV ACCESS (CUSTOM PROCEDURE TRAY) ×4 IMPLANT
PACK SURGICAL SETUP 50X90 (CUSTOM PROCEDURE TRAY) ×4 IMPLANT
PAD ARMBOARD 7.5X6 YLW CONV (MISCELLANEOUS) ×8 IMPLANT
SPONGE SURGIFOAM ABS GEL 100 (HEMOSTASIS) IMPLANT
SUT ETHILON 3 0 PS 1 (SUTURE) IMPLANT
SUT PROLENE 5 0 C 1 24 (SUTURE) ×4 IMPLANT
SUT PROLENE 6 0 BV (SUTURE) ×8 IMPLANT
SUT VIC AB 3-0 SH 27 (SUTURE) ×2
SUT VIC AB 3-0 SH 27X BRD (SUTURE) ×2 IMPLANT
SUT VIC AB 4-0 PS2 18 (SUTURE) ×4 IMPLANT
SUT VICRYL 4-0 PS2 18IN ABS (SUTURE) IMPLANT
SYR 10ML LL (SYRINGE) ×4 IMPLANT
SYR 20CC LL (SYRINGE) ×8 IMPLANT
SYR 5ML LL (SYRINGE) ×8 IMPLANT
SYR CONTROL 10ML LL (SYRINGE) ×4 IMPLANT
TOWEL GREEN STERILE (TOWEL DISPOSABLE) ×8 IMPLANT
TOWEL GREEN STERILE FF (TOWEL DISPOSABLE) ×4 IMPLANT
UNDERPAD 30X30 (UNDERPADS AND DIAPERS) ×4 IMPLANT
WATER STERILE IRR 1000ML POUR (IV SOLUTION) ×4 IMPLANT

## 2017-05-31 NOTE — Transfer of Care (Signed)
Immediate Anesthesia Transfer of Care Note  Patient: Jonathan Terry  Procedure(s) Performed: PLICATION OF ARTERIOVENOUS FISTULA LEFT ARM (Left )  Patient Location: PACU  Anesthesia Type:MAC  Level of Consciousness: awake, sedated and patient cooperative  Airway & Oxygen Therapy: Patient Spontanous Breathing  Post-op Assessment: Report given to RN, Post -op Vital signs reviewed and stable and Patient moving all extremities  Post vital signs: Reviewed and stable  Last Vitals:  Vitals Value Taken Time  BP 119/59 05/31/2017  1:33 PM  Temp 36.5 C 05/31/2017  1:33 PM  Pulse 45 05/31/2017  1:35 PM  Resp 10 05/31/2017  1:35 PM  SpO2 97 % 05/31/2017  1:35 PM  Vitals shown include unvalidated device data.  Last Pain:  Vitals:   05/31/17 1333  TempSrc:   PainSc: 0-No pain      Patients Stated Pain Goal: 3 (39/[id number])  Complications: No apparent anesthesia complications

## 2017-05-31 NOTE — Op Note (Signed)
    NAME: Jonathan Terry    MRN: [id number] DOB: [birth year]    DATE OF OPERATION: 05/31/2017  PREOP DIAGNOSIS:    Aneurysm of left BC AVF  POSTOP DIAGNOSIS:    same  PROCEDURE:    Plication of Left BC AVF  SURGEON: Di Kindle. Edilia Bo, MD, FACS  ASSIST: Aggie Moats, PA  ANESTHESIA: local with sedation   EBL: min   INDICATIONS:    [name] is a 62 y.o. male who has 2 aneurysms on his left upper arm fistula.  There was some thinning skin over these and he presents for plication.  I felt that we could plicate the more peripheral aneurysm as the skin here was more compromised and therefore prevent him from needing a catheter.  This would still allow access.  The more central aneurysm could be fixed in a staged fashion.  FINDINGS:   There should be plenty of room to stick above and below the plication.  TECHNIQUE:   The patient was taken to the operating room and sedated by anesthesia.  The left arm was prepped and draped in usual sterile fashion.  That was more peripherally located.  The fistula was dissected free circumferentially.  There was a large aneurysm.  An elliptical incision was made over the fistula the anterior wall was thinned and more diseased and therefore I could really not excise the posterior wall.  Once this was dissected free circumferentially and I got back to normal fistula proximally distally the patient was heparinized and the fistula was clamped.  A large ellipse of the aneurysm was excised and then the vein was sewn back with running 5-0 Prolene suture.  I was able to sew this so that the majority of the fistula the suture line was lateral and not anterior.  I then tacked the one area that would potentially be at risk for cannulating the suture line by rolling this slightly.  Thus if the fistula is cannulated anteriorly the suture line would not be at risk.  Hemostasis was obtained of the wound.  The wound was closed with a deep layer of 3-0 Vicryl  the skin closed with 4-0 Vicryl.  Dermabond was applied.  Patient tolerated the procedure well was transferred to the recovery room in stable condition.  All needle and sponge counts were correct.  CLINICAL NOTE: I did mark specifically where the fistula could be cannulated with both needles.  This was marked on the skin and covered with OpSite.  Waverly Ferrari, MD, FACS [facility]  DATE OF DICTATION:   05/31/2017

## 2017-05-31 NOTE — Interval H&P Note (Signed)
History and Physical Interval Note:  05/31/2017 11:26 AM  Jonathan Terry  has presented today for surgery, with the diagnosis of COMPLICATION WITH FISTULA  The various methods of treatment have been discussed with the patient and family. After consideration of risks, benefits and other options for treatment, the patient has consented to  Procedure(s): PLICATION OF ARTERIOVENOUS FISTULA LEFT ARM (Left) INSERTION OF DIALYSIS CATHETER (N/A) as a surgical intervention .  The patient's history has been reviewed, patient examined, no change in status, stable for surgery.  I have reviewed the patient's chart and labs.  Questions were answered to the patient's satisfaction.     Waverly Ferrari

## 2017-05-31 NOTE — Anesthesia Preprocedure Evaluation (Signed)
Anesthesia Evaluation  Patient identified by MRN, date of birth, ID band Patient awake    Reviewed: Allergy & Precautions, NPO status   Airway Mallampati: II  TM Distance: >3 FB     Dental   Pulmonary sleep apnea , COPD, former smoker,    breath sounds clear to auscultation       Cardiovascular hypertension, + angina + CAD and +CHF   Rhythm:Regular Rate:Normal     Neuro/Psych    GI/Hepatic Neg liver ROS, GERD  ,  Endo/Other  negative endocrine ROS  Renal/GU Renal disease     Musculoskeletal   Abdominal   Peds  Hematology   Anesthesia Other Findings   Reproductive/Obstetrics                             Anesthesia Physical Anesthesia Plan  ASA: III  Anesthesia Plan: MAC   Post-op Pain Management:    Induction: Intravenous  PONV Risk Score and Plan: Treatment may vary due to age or medical condition and Ondansetron  Airway Management Planned: Nasal Cannula and Simple Face Mask  Additional Equipment:   Intra-op Plan:   Post-operative Plan:   Informed Consent: I have reviewed the patients History and Physical, chart, labs and discussed the procedure including the risks, benefits and alternatives for the proposed anesthesia with the patient or authorized representative who has indicated his/her understanding and acceptance.     Plan Discussed with: CRNA and Anesthesiologist  Anesthesia Plan Comments:         Anesthesia Quick Evaluation

## 2017-05-31 NOTE — Anesthesia Postprocedure Evaluation (Signed)
Anesthesia Post Note  Patient: Jonathan Terry  Procedure(s) Performed: PLICATION OF ARTERIOVENOUS FISTULA LEFT ARM (Left )     Patient location during evaluation: PACU Anesthesia Type: MAC Level of consciousness: awake Pain management: pain level controlled Vital Signs Assessment: post-procedure vital signs reviewed and stable Respiratory status: spontaneous breathing Cardiovascular status: stable Anesthetic complications: no    Last Vitals:  Vitals:   05/31/17 1351 05/31/17 1358  BP: 130/70 126/70  Pulse: (!) 51 (!) 50  Resp: 14 18  Temp: 36.5 C   SpO2: 97% 94%    Last Pain:  Vitals:   05/31/17 1358  TempSrc:   PainSc: 0-No pain                 Maybel Dambrosio

## 2017-06-01 ENCOUNTER — Encounter (HOSPITAL_COMMUNITY): Payer: Self-pay | Admitting: Vascular Surgery

## 2017-06-01 ENCOUNTER — Telehealth: Payer: Self-pay | Admitting: Vascular Surgery

## 2017-06-01 NOTE — Telephone Encounter (Signed)
sch appt 07/13/17 930 am p/o CSD s/p plication L arm AVF

## 2017-07-13 ENCOUNTER — Other Ambulatory Visit: Payer: Self-pay

## 2017-07-13 ENCOUNTER — Encounter: Payer: Self-pay | Admitting: Vascular Surgery

## 2017-07-13 ENCOUNTER — Encounter: Payer: Medicare Other | Admitting: Vascular Surgery

## 2017-07-13 ENCOUNTER — Ambulatory Visit (INDEPENDENT_AMBULATORY_CARE_PROVIDER_SITE_OTHER): Payer: Self-pay | Admitting: Vascular Surgery

## 2017-07-13 VITALS — BP 156/60 | HR 56 | Temp 97.7°F | Resp 20 | Ht 65.0 in | Wt 146.2 lb

## 2017-07-13 DIAGNOSIS — Z48812 Encounter for surgical aftercare following surgery on the circulatory system: Secondary | ICD-10-CM

## 2017-07-13 NOTE — Progress Notes (Signed)
Patient name: Jonathan Terry MRN: [id number] DOB: 1955/12/10 Sex: male  REASON FOR VISIT:   Follow-up status post plication of left brachiocephalic fistula  HPI:   Jonathan Terry is a pleasant 62 y.o. 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  There was some thinning of the skin overlying these areas and he presents for plication.  I felt that we could plicate the more peripheral aneurysm as the skin here was more compromised.  The more central aneurysm could be fixed in a staged fashion.  The patient is doing well and has no specific complaints.  His fistula has been working well.  Current Outpatient Medications  Medication Sig Dispense Refill  . aspirin EC 81 MG EC tablet Take 1 tablet (81 mg total) by mouth daily.    Marland Kitchen. FIBER PO Take 1 capsule by mouth 3 (three) times daily.    Marland Kitchen. FLUoxetine (PROZAC) 20 MG capsule Take 20 mg by mouth 2 (two) times daily.    . hydrALAZINE (APRESOLINE) 25 MG tablet Take 1 tablet (25 mg total) by mouth every 8 (eight) hours. 90 tablet 0  . ipratropium (ATROVENT HFA) 17 MCG/ACT inhaler Inhale 1 puff into the lungs 2 (two) times daily as needed for wheezing.    Marland Kitchen. ipratropium-albuterol (DUONEB) 0.5-2.5 (3) MG/3ML SOLN Take 3 mLs by nebulization every 6 (six) hours. Dx: J44.9 (Patient taking differently: Take 3 mLs by nebulization every 6 (six) hours as needed (for shortness of breath). Dx: J44.9) 360 mL 5  . isosorbide dinitrate (ISORDIL) 10 MG tablet Take 1 tablet (10 mg total) by mouth 3 (three) times daily. 90 tablet 0  . metoprolol succinate (TOPROL-XL) 25 MG 24 hr tablet Take 1 tablet (25 mg total) by mouth daily. (Patient taking differently: Take 25 mg by mouth at bedtime. ) 30 tablet 0  . Multiple Vitamins-Minerals (CENTRUM SILVER PO) Take 1 tablet by mouth daily.    Marland Kitchen. omeprazole (PRILOSEC) 20 MG capsule Take 20 mg by mouth daily.    Marland Kitchen. oxyCODONE (ROXICODONE) 15 MG immediate release tablet Take 15 mg by mouth every 6 (six) hours as needed  for pain.    . rosuvastatin (CRESTOR) 10 MG tablet Take 10 mg by mouth daily.     No current facility-administered medications for this visit.     REVIEW OF SYSTEMS:  [X]  denotes positive finding, [ ]  denotes negative finding Cardiac  Comments:  Chest pain or chest pressure:    Shortness of breath upon exertion:    Short of breath when lying flat:    Irregular heart rhythm:    Constitutional    Fever or chills:     PHYSICAL EXAM:   Vitals:   07/13/17 0914 07/13/17 0916  BP: (!) 151/66 (!) 156/60  Pulse: (!) 56   Resp: 20   Temp: 97.7 F (36.5 C)   TempSrc: Oral   SpO2: 97%   Weight: 146 lb 3.2 oz (66.3 kg)   Height: 5\' 5"  (1.651 m)     GENERAL: The patient is a well-nourished male, in no acute distress. The vital signs are documented above. CARDIOVASCULAR: There is a regular rate and rhythm. PULMONARY: There is good air exchange bilaterally without wheezing or rales. He has an excellent thrill in his fistula and his incision is healed nicely.  DATA:   No new data.  MEDICAL ISSUES:   STATUS POST PLICATION OF LEFT AV FISTULA: His fistula is working well.  I still think it is  too early to address the more central aneurysm as this would not give him much room to stick.  Therefore I will see him back in 2 months and at that time we can arrange plication of the more central aneurysm.  Waverly Ferrari [facility] [phone]

## 2017-09-28 ENCOUNTER — Ambulatory Visit: Payer: Medicare Other | Admitting: Vascular Surgery

## 2017-09-28 ENCOUNTER — Other Ambulatory Visit: Payer: Self-pay

## 2017-09-28 ENCOUNTER — Encounter: Payer: Self-pay | Admitting: *Deleted

## 2017-09-28 ENCOUNTER — Encounter: Payer: Self-pay | Admitting: Vascular Surgery

## 2017-09-28 ENCOUNTER — Other Ambulatory Visit: Payer: Self-pay | Admitting: *Deleted

## 2017-09-28 ENCOUNTER — Ambulatory Visit (INDEPENDENT_AMBULATORY_CARE_PROVIDER_SITE_OTHER): Payer: Medicare Other | Admitting: Vascular Surgery

## 2017-09-28 VITALS — BP 165/67 | HR 53 | Temp 97.1°F | Resp 16 | Ht 65.0 in | Wt 145.0 lb

## 2017-09-28 DIAGNOSIS — N186 End stage renal disease: Secondary | ICD-10-CM

## 2017-09-28 DIAGNOSIS — Z992 Dependence on renal dialysis: Secondary | ICD-10-CM | POA: Diagnosis not present

## 2017-09-28 NOTE — Progress Notes (Signed)
 Patient name: Jonathan Terry MRN: [id number] DOB: 06/27/1955 Sex: male  REASON FOR VISIT:   Follow-up after plication of left brachiocephalic AV fistula.  HPI:   Jonathan Terry is a pleasant 62 y.o. male who I last saw on 07/13/2017.  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  I felt the plication of the more peripheral aneurysm was indicated first given that the skin was compromised here.  We could address the more central aneurysm in a staged fashion.  When I saw him in follow-up the fistula was working well.  I thought it was too early to address the central aneurysm as there would not be much room to stick the fistula.  He comes in for a 2-month follow-up visit to discuss plicating the more centrally located aneurysm.  Today he has no specific complaints.  His fistula has been working well.  He dialyzes on Monday Wednesdays and Fridays.  Current Outpatient Medications  Medication Sig Dispense Refill  . aspirin EC 81 MG EC tablet Take 1 tablet (81 mg total) by mouth daily.    . FIBER PO Take 1 capsule by mouth 3 (three) times daily.    . FLUoxetine (PROZAC) 20 MG capsule Take 20 mg by mouth 2 (two) times daily.    . hydrALAZINE (APRESOLINE) 25 MG tablet Take 1 tablet (25 mg total) by mouth every 8 (eight) hours. 90 tablet 0  . ipratropium (ATROVENT HFA) 17 MCG/ACT inhaler Inhale 1 puff into the lungs 2 (two) times daily as needed for wheezing.    . ipratropium-albuterol (DUONEB) 0.5-2.5 (3) MG/3ML SOLN Take 3 mLs by nebulization every 6 (six) hours. Dx: J44.9 (Patient taking differently: Take 3 mLs by nebulization every 6 (six) hours as needed (for shortness of breath). Dx: J44.9) 360 mL 5  . isosorbide dinitrate (ISORDIL) 10 MG tablet Take 1 tablet (10 mg total) by mouth 3 (three) times daily. 90 tablet 0  . metoprolol succinate (TOPROL-XL) 25 MG 24 hr tablet Take 1 tablet (25 mg total) by mouth daily. (Patient taking differently: Take 25 mg by mouth at bedtime. ) 30 tablet  0  . Multiple Vitamins-Minerals (CENTRUM SILVER PO) Take 1 tablet by mouth daily.    . omeprazole (PRILOSEC) 20 MG capsule Take 20 mg by mouth daily.    . oxyCODONE (ROXICODONE) 15 MG immediate release tablet Take 15 mg by mouth every 6 (six) hours as needed for pain.    . rosuvastatin (CRESTOR) 10 MG tablet Take 10 mg by mouth daily.     No current facility-administered medications for this visit.     REVIEW OF SYSTEMS:  [X] denotes positive finding, [ ] denotes negative finding Vascular    Leg swelling    Cardiac    Chest pain or chest pressure:    Shortness of breath upon exertion:    Short of breath when lying flat:    Irregular heart rhythm:    Constitutional    Fever or chills:     PHYSICAL EXAM:   Vitals:   09/28/17 1154  BP: (!) 165/67  Pulse: (!) 53  Resp: 16  Temp: (!) 97.1 F (36.2 C)  TempSrc: Oral  SpO2: 96%  Weight: 145 lb (65.8 kg)  Height: 5' 5" (1.651 m)    GENERAL: The patient is a well-nourished male, in no acute distress. The vital signs are documented above. CARDIOVASCULAR: There is a regular rate and rhythm. PULMONARY: There is good air exchange bilaterally without wheezing   or rales. The central aneurysm has not changed significantly in size.  They have been sticking to needles below this area so I think at this point is safe to address this aneurysm.  DATA:   No new data.  MEDICAL ISSUES:   ANEURYSMAL LEFT UPPER ARM FISTULA: The more peripheral aneurysm has been successfully addressed.  He presents now for evaluation for repair of the more central aneurysm.  At this point I think it would be safe to proceed.  He dialyzes Monday Wednesdays and Fridays and will schedule this on a nondialysis day in future.  I have discussed the indications for the procedure and the potential complications and he is agreeable to proceed.  Selinda Korzeniewski [facility] [place] [facility] [phone] 

## 2017-09-28 NOTE — H&P (View-Only) (Signed)
Patient name: Jonathan Terry MRN: [id number] DOB: 03-28-1955 Sex: male  REASON FOR VISIT:   Follow-up after plication of left brachiocephalic AV fistula.  HPI:   Jonathan Terry is a pleasant 62 y.o. male who I last saw on 07/13/2017.  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  I felt the plication of the more peripheral aneurysm was indicated first given that the skin was compromised here.  We could address the more central aneurysm in a staged fashion.  When I saw him in follow-up the fistula was working well.  I thought it was too early to address the central aneurysm as there would not be much room to stick the fistula.  He comes in for a 71-month follow-up visit to discuss plicating the more centrally located aneurysm.  Today he has no specific complaints.  His fistula has been working well.  He dialyzes on Monday Wednesdays and Fridays.  Current Outpatient Medications  Medication Sig Dispense Refill  . aspirin EC 81 MG EC tablet Take 1 tablet (81 mg total) by mouth daily.    Marland Kitchen FIBER PO Take 1 capsule by mouth 3 (three) times daily.    Marland Kitchen FLUoxetine (PROZAC) 20 MG capsule Take 20 mg by mouth 2 (two) times daily.    . hydrALAZINE (APRESOLINE) 25 MG tablet Take 1 tablet (25 mg total) by mouth every 8 (eight) hours. 90 tablet 0  . ipratropium (ATROVENT HFA) 17 MCG/ACT inhaler Inhale 1 puff into the lungs 2 (two) times daily as needed for wheezing.    Marland Kitchen ipratropium-albuterol (DUONEB) 0.5-2.5 (3) MG/3ML SOLN Take 3 mLs by nebulization every 6 (six) hours. Dx: J44.9 (Patient taking differently: Take 3 mLs by nebulization every 6 (six) hours as needed (for shortness of breath). Dx: J44.9) 360 mL 5  . isosorbide dinitrate (ISORDIL) 10 MG tablet Take 1 tablet (10 mg total) by mouth 3 (three) times daily. 90 tablet 0  . metoprolol succinate (TOPROL-XL) 25 MG 24 hr tablet Take 1 tablet (25 mg total) by mouth daily. (Patient taking differently: Take 25 mg by mouth at bedtime. ) 30 tablet  0  . Multiple Vitamins-Minerals (CENTRUM SILVER PO) Take 1 tablet by mouth daily.    Marland Kitchen omeprazole (PRILOSEC) 20 MG capsule Take 20 mg by mouth daily.    Marland Kitchen oxyCODONE (ROXICODONE) 15 MG immediate release tablet Take 15 mg by mouth every 6 (six) hours as needed for pain.    . rosuvastatin (CRESTOR) 10 MG tablet Take 10 mg by mouth daily.     No current facility-administered medications for this visit.     REVIEW OF SYSTEMS:  [X]  denotes positive finding, [ ]  denotes negative finding Vascular    Leg swelling    Cardiac    Chest pain or chest pressure:    Shortness of breath upon exertion:    Short of breath when lying flat:    Irregular heart rhythm:    Constitutional    Fever or chills:     PHYSICAL EXAM:   Vitals:   09/28/17 1154  BP: (!) 165/67  Pulse: (!) 53  Resp: 16  Temp: (!) 97.1 F (36.2 C)  TempSrc: Oral  SpO2: 96%  Weight: 145 lb (65.8 kg)  Height: 5\' 5"  (1.651 m)    GENERAL: The patient is a well-nourished male, in no acute distress. The vital signs are documented above. CARDIOVASCULAR: There is a regular rate and rhythm. PULMONARY: There is good air exchange bilaterally without wheezing  or rales. The central aneurysm has not changed significantly in size.  They have been sticking to needles below this area so I think at this point is safe to address this aneurysm.  DATA:   No new data.  MEDICAL ISSUES:   ANEURYSMAL LEFT UPPER ARM FISTULA: The more peripheral aneurysm has been successfully addressed.  He presents now for evaluation for repair of the more central aneurysm.  At this point I think it would be safe to proceed.  He dialyzes Monday Wednesdays and Fridays and will schedule this on a nondialysis day in future.  I have discussed the indications for the procedure and the potential complications and he is agreeable to proceed.  Waverly Ferrarihristopher Hakan Nudelman [facility] [phone]

## 2017-09-29 ENCOUNTER — Ambulatory Visit: Payer: Medicare Other | Admitting: Vascular Surgery

## 2017-10-17 ENCOUNTER — Encounter (HOSPITAL_COMMUNITY): Payer: Self-pay | Admitting: *Deleted

## 2017-10-17 ENCOUNTER — Other Ambulatory Visit: Payer: Self-pay

## 2017-10-18 ENCOUNTER — Ambulatory Visit (HOSPITAL_COMMUNITY): Payer: Medicare Other | Admitting: Anesthesiology

## 2017-10-18 ENCOUNTER — Encounter (HOSPITAL_COMMUNITY): Payer: Self-pay

## 2017-10-18 ENCOUNTER — Ambulatory Visit (HOSPITAL_COMMUNITY)
Admission: RE | Admit: 2017-10-18 | Discharge: 2017-10-18 | Disposition: A | Discharge status: Home / Self Care | Payer: Medicare Other | Source: Ambulatory Visit | Attending: Vascular Surgery | Admitting: Vascular Surgery

## 2017-10-18 ENCOUNTER — Encounter (HOSPITAL_COMMUNITY)
Admission: RE | Disposition: A | Discharge status: Home / Self Care | Payer: Self-pay | Source: Ambulatory Visit | Attending: Vascular Surgery

## 2017-10-18 DIAGNOSIS — K219 Gastro-esophageal reflux disease without esophagitis: Secondary | ICD-10-CM | POA: Insufficient documentation

## 2017-10-18 DIAGNOSIS — I721 Aneurysm of artery of upper extremity: Secondary | ICD-10-CM | POA: Diagnosis present

## 2017-10-18 DIAGNOSIS — Z992 Dependence on renal dialysis: Secondary | ICD-10-CM | POA: Insufficient documentation

## 2017-10-18 DIAGNOSIS — I272 Pulmonary hypertension, unspecified: Secondary | ICD-10-CM | POA: Insufficient documentation

## 2017-10-18 DIAGNOSIS — I5042 Chronic combined systolic (congestive) and diastolic (congestive) heart failure: Secondary | ICD-10-CM | POA: Insufficient documentation

## 2017-10-18 DIAGNOSIS — T82898A Other specified complication of vascular prosthetic devices, implants and grafts, initial encounter: Secondary | ICD-10-CM

## 2017-10-18 DIAGNOSIS — N179 Acute kidney failure, unspecified: Secondary | ICD-10-CM | POA: Insufficient documentation

## 2017-10-18 DIAGNOSIS — Z955 Presence of coronary angioplasty implant and graft: Secondary | ICD-10-CM | POA: Insufficient documentation

## 2017-10-18 DIAGNOSIS — I132 Hypertensive heart and chronic kidney disease with heart failure and with stage 5 chronic kidney disease, or end stage renal disease: Secondary | ICD-10-CM | POA: Insufficient documentation

## 2017-10-18 DIAGNOSIS — N186 End stage renal disease: Secondary | ICD-10-CM

## 2017-10-18 DIAGNOSIS — G4733 Obstructive sleep apnea (adult) (pediatric): Secondary | ICD-10-CM | POA: Insufficient documentation

## 2017-10-18 DIAGNOSIS — M1990 Unspecified osteoarthritis, unspecified site: Secondary | ICD-10-CM | POA: Diagnosis not present

## 2017-10-18 DIAGNOSIS — Z9889 Other specified postprocedural states: Secondary | ICD-10-CM | POA: Insufficient documentation

## 2017-10-18 DIAGNOSIS — Z8674 Personal history of sudden cardiac arrest: Secondary | ICD-10-CM | POA: Insufficient documentation

## 2017-10-18 DIAGNOSIS — Z7982 Long term (current) use of aspirin: Secondary | ICD-10-CM | POA: Insufficient documentation

## 2017-10-18 DIAGNOSIS — I2510 Atherosclerotic heart disease of native coronary artery without angina pectoris: Secondary | ICD-10-CM | POA: Diagnosis not present

## 2017-10-18 DIAGNOSIS — Z79899 Other long term (current) drug therapy: Secondary | ICD-10-CM | POA: Diagnosis not present

## 2017-10-18 DIAGNOSIS — Z8673 Personal history of transient ischemic attack (TIA), and cerebral infarction without residual deficits: Secondary | ICD-10-CM | POA: Insufficient documentation

## 2017-10-18 HISTORY — DX: Sleep apnea, unspecified: G47.30

## 2017-10-18 HISTORY — PX: REVISON OF ARTERIOVENOUS FISTULA: SHX6074

## 2017-10-18 HISTORY — DX: Dyspnea, unspecified: R06.00

## 2017-10-18 HISTORY — DX: Pulmonary hypertension, unspecified: I27.20

## 2017-10-18 LAB — POCT I-STAT 4, (NA,K, GLUC, HGB,HCT)
GLUCOSE: 99 mg/dL (ref 70–99)
HEMATOCRIT: 34 % — AB (ref 39.0–52.0)
HEMOGLOBIN: 11.6 g/dL — AB (ref 13.0–17.0)
Potassium: 4.1 mmol/L (ref 3.5–5.1)
SODIUM: 138 mmol/L (ref 135–145)

## 2017-10-18 LAB — GLUCOSE, CAPILLARY: Glucose-Capillary: 84 mg/dL (ref 70–99)

## 2017-10-18 SURGERY — REVISON OF ARTERIOVENOUS FISTULA
Anesthesia: Monitor Anesthesia Care | Site: Arm Upper | Laterality: Left

## 2017-10-18 MED ORDER — ONDANSETRON HCL 4 MG/2ML IJ SOLN
INTRAMUSCULAR | Status: DC | PRN
  Administered 2017-10-18: 4 mg via INTRAVENOUS

## 2017-10-18 MED ORDER — SODIUM CHLORIDE 0.9 % IV SOLN
INTRAVENOUS | Status: AC
  Filled 2017-10-18: qty 1.2

## 2017-10-18 MED ORDER — ONDANSETRON HCL 4 MG/2ML IJ SOLN: 4.0000 mg | Freq: Four times a day (QID) | INTRAMUSCULAR | Status: DC | PRN

## 2017-10-18 MED ORDER — FENTANYL CITRATE (PF) 100 MCG/2ML IJ SOLN: 25.0000 ug | INTRAMUSCULAR | Status: DC | PRN

## 2017-10-18 MED ORDER — LIDOCAINE-EPINEPHRINE (PF) 1 %-1:200000 IJ SOLN
INTRAMUSCULAR | Status: AC
  Filled 2017-10-18: qty 30

## 2017-10-18 MED ORDER — SODIUM CHLORIDE 0.9 % IJ SOLN
INTRAMUSCULAR | Status: AC
  Filled 2017-10-18: qty 20

## 2017-10-18 MED ORDER — HEPARIN SODIUM (PORCINE) 1000 UNIT/ML IJ SOLN
INTRAMUSCULAR | Status: DC | PRN
  Administered 2017-10-18: 6000 [IU] via INTRAVENOUS

## 2017-10-18 MED ORDER — OXYCODONE HCL 5 MG PO TABS: 5.0000 mg | ORAL_TABLET | Freq: Once | ORAL | Status: DC | PRN

## 2017-10-18 MED ORDER — LIDOCAINE-EPINEPHRINE (PF) 1 %-1:200000 IJ SOLN
INTRAMUSCULAR | Status: DC | PRN
  Administered 2017-10-18: 19 mL

## 2017-10-18 MED ORDER — SODIUM CHLORIDE 0.9 % IR SOLN
Status: DC | PRN
  Administered 2017-10-18: 1000 mL

## 2017-10-18 MED ORDER — FENTANYL CITRATE (PF) 250 MCG/5ML IJ SOLN
INTRAMUSCULAR | Status: DC | PRN
  Administered 2017-10-18 (×3): 25 ug via INTRAVENOUS

## 2017-10-18 MED ORDER — CEFAZOLIN SODIUM 1 G IJ SOLR
INTRAMUSCULAR | Status: AC
  Filled 2017-10-18: qty 20

## 2017-10-18 MED ORDER — SODIUM CHLORIDE 0.9 % IV SOLN
INTRAVENOUS | Status: DC
  Administered 2017-10-18 (×2): via INTRAVENOUS

## 2017-10-18 MED ORDER — PHENYLEPHRINE HCL 10 MG/ML IJ SOLN
INTRAMUSCULAR | Status: DC | PRN
  Administered 2017-10-18 (×4): 40 ug via INTRAVENOUS

## 2017-10-18 MED ORDER — PROPOFOL 10 MG/ML IV BOLUS
INTRAVENOUS | Status: DC | PRN
  Administered 2017-10-18: 20 mg via INTRAVENOUS

## 2017-10-18 MED ORDER — OXYCODONE HCL 5 MG/5ML PO SOLN: 5.0000 mg | Freq: Once | ORAL | Status: DC | PRN

## 2017-10-18 MED ORDER — SODIUM CHLORIDE 0.9 % IV SOLN
INTRAVENOUS | Status: DC | PRN
  Administered 2017-10-18: 500 mL

## 2017-10-18 MED ORDER — LIDOCAINE 2% (20 MG/ML) 5 ML SYRINGE
INTRAMUSCULAR | Status: DC | PRN
  Administered 2017-10-18: 60 mg via INTRAVENOUS

## 2017-10-18 MED ORDER — PROPOFOL 500 MG/50ML IV EMUL
INTRAVENOUS | Status: DC | PRN
  Administered 2017-10-18: 25 ug/kg/min via INTRAVENOUS

## 2017-10-18 MED ORDER — PROPOFOL 10 MG/ML IV BOLUS
INTRAVENOUS | Status: AC
  Filled 2017-10-18: qty 20

## 2017-10-18 MED ORDER — PROTAMINE SULFATE 10 MG/ML IV SOLN
INTRAVENOUS | Status: DC | PRN
  Administered 2017-10-18: 30 mg via INTRAVENOUS

## 2017-10-18 MED ORDER — CEFAZOLIN SODIUM-DEXTROSE 2-4 GM/100ML-% IV SOLN
2.0000 g | INTRAVENOUS | Status: AC
  Administered 2017-10-18: 2 g via INTRAVENOUS

## 2017-10-18 MED ORDER — LIDOCAINE HCL (PF) 1 % IJ SOLN
INTRAMUSCULAR | Status: AC
  Filled 2017-10-18: qty 30

## 2017-10-18 MED ORDER — OXYCODONE HCL 5 MG PO TABS: 5.0000 mg | ORAL_TABLET | ORAL | 0 refills | Status: AC | PRN

## 2017-10-18 MED ORDER — HEPARIN SODIUM (PORCINE) 1000 UNIT/ML IJ SOLN
INTRAMUSCULAR | Status: AC
  Filled 2017-10-18: qty 1

## 2017-10-18 MED ORDER — FENTANYL CITRATE (PF) 250 MCG/5ML IJ SOLN
INTRAMUSCULAR | Status: AC
  Filled 2017-10-18: qty 5

## 2017-10-18 SURGICAL SUPPLY — 39 items
ARMBAND PINK RESTRICT EXTREMIT (MISCELLANEOUS) ×3 IMPLANT
BNDG ESMARK 4X9 LF (GAUZE/BANDAGES/DRESSINGS) ×3 IMPLANT
CANISTER SUCT 3000ML PPV (MISCELLANEOUS) ×3 IMPLANT
CANNULA VESSEL 3MM 2 BLNT TIP (CANNULA) ×3 IMPLANT
CLIP VESOCCLUDE MED 6/CT (CLIP) ×3 IMPLANT
CLIP VESOCCLUDE SM WIDE 6/CT (CLIP) ×3 IMPLANT
COVER PROBE W GEL 5X96 (DRAPES) IMPLANT
COVER WAND RF STERILE (DRAPES) ×3 IMPLANT
CUFF TOURNIQUET SINGLE 18IN (TOURNIQUET CUFF) ×3 IMPLANT
DERMABOND ADVANCED (GAUZE/BANDAGES/DRESSINGS) ×2
DERMABOND ADVANCED .7 DNX12 (GAUZE/BANDAGES/DRESSINGS) ×1 IMPLANT
ELECT REM PT RETURN 9FT ADLT (ELECTROSURGICAL) ×3
ELECTRODE REM PT RTRN 9FT ADLT (ELECTROSURGICAL) ×1 IMPLANT
GLOVE BIO SURGEON STRL SZ 6 (GLOVE) ×3 IMPLANT
GLOVE BIO SURGEON STRL SZ7.5 (GLOVE) ×6 IMPLANT
GLOVE BIOGEL PI IND STRL 6.5 (GLOVE) ×1 IMPLANT
GLOVE BIOGEL PI IND STRL 7.0 (GLOVE) ×1 IMPLANT
GLOVE BIOGEL PI IND STRL 8 (GLOVE) ×1 IMPLANT
GLOVE BIOGEL PI INDICATOR 6.5 (GLOVE) ×2
GLOVE BIOGEL PI INDICATOR 7.0 (GLOVE) ×2
GLOVE BIOGEL PI INDICATOR 8 (GLOVE) ×2
GOWN STRL REUS W/ TWL LRG LVL3 (GOWN DISPOSABLE) ×3 IMPLANT
GOWN STRL REUS W/ TWL XL LVL3 (GOWN DISPOSABLE) ×1 IMPLANT
GOWN STRL REUS W/TWL LRG LVL3 (GOWN DISPOSABLE) ×6
GOWN STRL REUS W/TWL XL LVL3 (GOWN DISPOSABLE) ×2
KIT BASIN OR (CUSTOM PROCEDURE TRAY) ×3 IMPLANT
KIT TURNOVER KIT B (KITS) ×3 IMPLANT
NS IRRIG 1000ML POUR BTL (IV SOLUTION) ×3 IMPLANT
PACK CV ACCESS (CUSTOM PROCEDURE TRAY) ×3 IMPLANT
PAD ARMBOARD 7.5X6 YLW CONV (MISCELLANEOUS) ×6 IMPLANT
SPONGE SURGIFOAM ABS GEL 100 (HEMOSTASIS) IMPLANT
SUT PROLENE 5 0 C 1 24 (SUTURE) ×3 IMPLANT
SUT PROLENE 6 0 BV (SUTURE) ×12 IMPLANT
SUT VIC AB 3-0 SH 27 (SUTURE) ×2
SUT VIC AB 3-0 SH 27X BRD (SUTURE) ×1 IMPLANT
SUT VICRYL 4-0 PS2 18IN ABS (SUTURE) ×3 IMPLANT
TOWEL GREEN STERILE (TOWEL DISPOSABLE) ×3 IMPLANT
UNDERPAD 30X30 (UNDERPADS AND DIAPERS) ×3 IMPLANT
WATER STERILE IRR 1000ML POUR (IV SOLUTION) ×3 IMPLANT

## 2017-10-18 NOTE — Interval H&P Note (Signed)
History and Physical Interval Note:  10/18/2017 1:12 PM  Jonathan Terry  has presented today for surgery, with the diagnosis of fistula pseudoaneurysm  The various methods of treatment have been discussed with the patient and family. After consideration of risks, benefits and other options for treatment, the patient has consented to  Procedure(s): REVISION PLICATION OF ARTERIOVENOUS FISTULA ARM (Left) as a surgical intervention .  The patient's history has been reviewed, patient examined, no change in status, stable for surgery.  I have reviewed the patient's chart and labs.  Questions were answered to the patient's satisfaction.     Waverly Ferrari

## 2017-10-18 NOTE — Transfer of Care (Signed)
Immediate Anesthesia Transfer of Care Note  Patient: Jonathan Terry  Procedure(s) Performed: REVISION PLICATION OF ARTERIOVENOUS FISTULA Left ARM (Left Arm Upper)  Patient Location: PACU  Anesthesia Type:MAC  Level of Consciousness: awake, alert  and oriented  Airway & Oxygen Therapy: Patient Spontanous Breathing  Post-op Assessment: Report given to RN, Post -op Vital signs reviewed and stable and Patient moving all extremities  Post vital signs: Reviewed and stable  Last Vitals:  Vitals Value Taken Time  BP 133/67 10/18/2017  2:47 PM  Temp    Pulse 70 10/18/2017  2:47 PM  Resp    SpO2 91 % 10/18/2017  2:47 PM  Vitals shown include unvalidated device data.  Last Pain:  Vitals:   10/18/17 1123  TempSrc:   PainSc: 0-No pain      Patients Stated Pain Goal: 2 (56/38/93 7342)  Complications: No apparent anesthesia complications

## 2017-10-18 NOTE — Op Note (Signed)
    NAME: Jonathan Terry    MRN: [id number] DOB: December 16, 1955    DATE OF OPERATION: 10/18/2017  PREOP DIAGNOSIS:    Aneurysm of left upper arm fistula  POSTOP DIAGNOSIS:    Same  PROCEDURE:    Plication of aneurysm of left upper arm fistula  SURGEON: Di Kindle. Edilia Bo, MD, FACS  ASSIST: Aggie Moats, PA  ANESTHESIA: Local with sedation  EBL: 100 cc  INDICATIONS:    [name] is a 62 y.o. male who had a large aneurysmal upper arm fistula.  He is undergone previous plication of a large aneurysm of the more peripheral fistula and presents for a staged plication of the more central aneurysm.  FINDINGS:   The fistula can be cannulated below the incision and also above the incision.  It should not be cannulated at the incision for 3 months.  TECHNIQUE:   The patient was taken to the operating room and sedated by anesthesia.  The left upper extremity was prepped and draped in usual sterile fashion.  An elliptical incision was made encompassing the aneurysm.  Through dense scar tissue I carefully dissected this free.  I did enter the fistula and in order to provide better exposure I elected to use a tourniquet.  The patient was heparinized.  The tourniquet was placed on the upper arm.  The arm was exsanguinated with an Esmarch bandage and the tourniquet inflated to 250 mmHg.  Under tourniquet control the distal fistula was dissected free.  Ultimately dissected the fistula free circumferentially.  A large ellipse of the aneurysmal fistula was excised and then the fistula sewn back with running 5-0 Prolene suture.  The tourniquet was then released and there was good hemostasis and a good thrill in the fistula.  I then slightly rolled the fistula by placing multiple tacking sutures with 6 so so that the suture line would not be anterior.  Next a deep layer 3-0 Vicryl was placed in a superficial layer 4-0 Vicryl.  Dermabond was applied.  The patient tolerated the procedure well was  transferred to the recovery room in stable condition.  All needle and sponge counts were correct.  Waverly Ferrari, MD, FACS [facility]  DATE OF DICTATION:   10/18/2017

## 2017-10-18 NOTE — Anesthesia Preprocedure Evaluation (Signed)
Anesthesia Evaluation  Patient identified by MRN, date of birth, ID band Patient awake    Reviewed: Allergy & Precautions, H&P , NPO status , Patient's Chart, lab work & pertinent test results  Airway Mallampati: II   Neck ROM: full    Dental   Pulmonary shortness of breath, sleep apnea , COPD, former smoker,    breath sounds clear to auscultation       Cardiovascular hypertension, + angina + CAD and +CHF   Rhythm:regular Rate:Normal     Neuro/Psych CVA    GI/Hepatic GERD  ,  Endo/Other    Renal/GU ESRF and DialysisRenal disease     Musculoskeletal  (+) Arthritis ,   Abdominal   Peds  Hematology  (+) Blood dyscrasia, anemia ,   Anesthesia Other Findings   Reproductive/Obstetrics                             Anesthesia Physical Anesthesia Plan  ASA: IV  Anesthesia Plan: MAC   Post-op Pain Management:    Induction: Intravenous  PONV Risk Score and Plan: 1 and Ondansetron, Propofol infusion and Treatment may vary due to age or medical condition  Airway Management Planned: Simple Face Mask  Additional Equipment:   Intra-op Plan:   Post-operative Plan:   Informed Consent: I have reviewed the patients History and Physical, chart, labs and discussed the procedure including the risks, benefits and alternatives for the proposed anesthesia with the patient or authorized representative who has indicated his/her understanding and acceptance.     Plan Discussed with: CRNA, Anesthesiologist and Surgeon  Anesthesia Plan Comments:         Anesthesia Quick Evaluation

## 2017-10-19 ENCOUNTER — Encounter (HOSPITAL_COMMUNITY): Payer: Self-pay | Admitting: Vascular Surgery

## 2017-10-20 NOTE — Anesthesia Postprocedure Evaluation (Signed)
Anesthesia Post Note  Patient: Jonathan Terry  Procedure(s) Performed: REVISION PLICATION OF ARTERIOVENOUS FISTULA Left ARM (Left Arm Upper)     Patient location during evaluation: PACU Anesthesia Type: MAC Level of consciousness: awake and alert Pain management: pain level controlled Vital Signs Assessment: post-procedure vital signs reviewed and stable Respiratory status: spontaneous breathing, nonlabored ventilation, respiratory function stable and patient connected to nasal cannula oxygen Cardiovascular status: stable and blood pressure returned to baseline Postop Assessment: no apparent nausea or vomiting Anesthetic complications: no    Last Vitals:  Vitals:   10/18/17 1500 10/18/17 1515  BP: (!) 141/67 139/78  Pulse: 68 65  Resp: 17 17  Temp:  (!) 36.2 C  SpO2: 95% 94%    Last Pain:  Vitals:   10/18/17 1515  TempSrc:   PainSc: 0-No pain                 Fabrizio Filip S

## 2017-11-14 ENCOUNTER — Observation Stay (HOSPITAL_COMMUNITY)
Admission: EM | Admit: 2017-11-14 | Discharge: 2017-11-16 | Disposition: A | Discharge status: Home / Self Care | Payer: Medicare Other | Attending: Internal Medicine | Admitting: Internal Medicine

## 2017-11-14 ENCOUNTER — Other Ambulatory Visit: Payer: Self-pay

## 2017-11-14 ENCOUNTER — Encounter (HOSPITAL_COMMUNITY): Payer: Self-pay | Admitting: Internal Medicine

## 2017-11-14 DIAGNOSIS — J90 Pleural effusion, not elsewhere classified: Secondary | ICD-10-CM | POA: Diagnosis not present

## 2017-11-14 DIAGNOSIS — Z8674 Personal history of sudden cardiac arrest: Secondary | ICD-10-CM | POA: Diagnosis not present

## 2017-11-14 DIAGNOSIS — I10 Essential (primary) hypertension: Secondary | ICD-10-CM | POA: Diagnosis present

## 2017-11-14 DIAGNOSIS — I5042 Chronic combined systolic (congestive) and diastolic (congestive) heart failure: Secondary | ICD-10-CM | POA: Insufficient documentation

## 2017-11-14 DIAGNOSIS — I288 Other diseases of pulmonary vessels: Secondary | ICD-10-CM | POA: Insufficient documentation

## 2017-11-14 DIAGNOSIS — I420 Dilated cardiomyopathy: Secondary | ICD-10-CM | POA: Diagnosis not present

## 2017-11-14 DIAGNOSIS — Z955 Presence of coronary angioplasty implant and graft: Secondary | ICD-10-CM | POA: Insufficient documentation

## 2017-11-14 DIAGNOSIS — I132 Hypertensive heart and chronic kidney disease with heart failure and with stage 5 chronic kidney disease, or end stage renal disease: Secondary | ICD-10-CM | POA: Diagnosis not present

## 2017-11-14 DIAGNOSIS — Z87891 Personal history of nicotine dependence: Secondary | ICD-10-CM | POA: Insufficient documentation

## 2017-11-14 DIAGNOSIS — I272 Pulmonary hypertension, unspecified: Secondary | ICD-10-CM | POA: Diagnosis not present

## 2017-11-14 DIAGNOSIS — I2510 Atherosclerotic heart disease of native coronary artery without angina pectoris: Secondary | ICD-10-CM | POA: Insufficient documentation

## 2017-11-14 DIAGNOSIS — M438X6 Other specified deforming dorsopathies, lumbar region: Secondary | ICD-10-CM | POA: Diagnosis not present

## 2017-11-14 DIAGNOSIS — R918 Other nonspecific abnormal finding of lung field: Secondary | ICD-10-CM | POA: Diagnosis not present

## 2017-11-14 DIAGNOSIS — J449 Chronic obstructive pulmonary disease, unspecified: Secondary | ICD-10-CM | POA: Insufficient documentation

## 2017-11-14 DIAGNOSIS — Z8249 Family history of ischemic heart disease and other diseases of the circulatory system: Secondary | ICD-10-CM | POA: Insufficient documentation

## 2017-11-14 DIAGNOSIS — N179 Acute kidney failure, unspecified: Secondary | ICD-10-CM | POA: Diagnosis not present

## 2017-11-14 DIAGNOSIS — Z7982 Long term (current) use of aspirin: Secondary | ICD-10-CM | POA: Insufficient documentation

## 2017-11-14 DIAGNOSIS — Z8673 Personal history of transient ischemic attack (TIA), and cerebral infarction without residual deficits: Secondary | ICD-10-CM | POA: Insufficient documentation

## 2017-11-14 DIAGNOSIS — D649 Anemia, unspecified: Secondary | ICD-10-CM | POA: Diagnosis present

## 2017-11-14 DIAGNOSIS — Z992 Dependence on renal dialysis: Secondary | ICD-10-CM | POA: Insufficient documentation

## 2017-11-14 DIAGNOSIS — Z841 Family history of disorders of kidney and ureter: Secondary | ICD-10-CM | POA: Insufficient documentation

## 2017-11-14 DIAGNOSIS — N186 End stage renal disease: Secondary | ICD-10-CM | POA: Diagnosis not present

## 2017-11-14 DIAGNOSIS — Z9889 Other specified postprocedural states: Secondary | ICD-10-CM | POA: Insufficient documentation

## 2017-11-14 DIAGNOSIS — Z79899 Other long term (current) drug therapy: Secondary | ICD-10-CM | POA: Diagnosis not present

## 2017-11-14 DIAGNOSIS — R600 Localized edema: Secondary | ICD-10-CM | POA: Insufficient documentation

## 2017-11-14 DIAGNOSIS — G4733 Obstructive sleep apnea (adult) (pediatric): Secondary | ICD-10-CM | POA: Insufficient documentation

## 2017-11-14 DIAGNOSIS — M1990 Unspecified osteoarthritis, unspecified site: Secondary | ICD-10-CM | POA: Insufficient documentation

## 2017-11-14 DIAGNOSIS — K219 Gastro-esophageal reflux disease without esophagitis: Secondary | ICD-10-CM | POA: Diagnosis not present

## 2017-11-14 DIAGNOSIS — R55 Syncope and collapse: Secondary | ICD-10-CM | POA: Diagnosis not present

## 2017-11-14 DIAGNOSIS — D631 Anemia in chronic kidney disease: Secondary | ICD-10-CM | POA: Diagnosis not present

## 2017-11-14 LAB — BASIC METABOLIC PANEL
Anion gap: 14 (ref 5–15)
BUN: 63 mg/dL — AB (ref 8–23)
CALCIUM: 9.7 mg/dL (ref 8.9–10.3)
CHLORIDE: 99 mmol/L (ref 98–111)
CO2: 22 mmol/L (ref 22–32)
CREATININE: 7.88 mg/dL — AB (ref 0.61–1.24)
GFR calc non Af Amer: 6 mL/min — ABNORMAL LOW (ref >60)
GFR, EST AFRICAN AMERICAN: 7 mL/min — AB (ref >60)
Glucose, Bld: 111 mg/dL — ABNORMAL HIGH (ref 70–99)
Potassium: 4.4 mmol/L (ref 3.5–5.1)
Sodium: 135 mmol/L (ref 135–145)

## 2017-11-14 LAB — HEPATIC FUNCTION PANEL
ALT: 17 U/L (ref 0–44)
AST: 25 U/L (ref 15–41)
Albumin: 3.3 g/dL — ABNORMAL LOW (ref 3.5–5.0)
Alkaline Phosphatase: 104 U/L (ref 38–126)
Bilirubin, Direct: 0.4 mg/dL — ABNORMAL HIGH (ref 0.0–0.2)
Indirect Bilirubin: 0.8 mg/dL (ref 0.3–0.9)
Total Bilirubin: 1.2 mg/dL (ref 0.3–1.2)
Total Protein: 8.1 g/dL (ref 6.5–8.1)

## 2017-11-14 LAB — CBG MONITORING, ED: Glucose-Capillary: 95 mg/dL (ref 70–99)

## 2017-11-14 LAB — TROPONIN I
Troponin I: 0.09 ng/mL (ref <0.03)
Troponin I: 0.07 ng/mL (ref <0.03)

## 2017-11-14 LAB — CBC
HEMATOCRIT: 31.4 % — AB (ref 39.0–52.0)
Hemoglobin: 10.3 g/dL — ABNORMAL LOW (ref 13.0–17.0)
MCH: 29.7 pg (ref 26.0–34.0)
MCHC: 32.8 g/dL (ref 30.0–36.0)
MCV: 90.5 fL (ref 80.0–100.0)
NRBC: 0 % (ref 0.0–0.2)
PLATELETS: 210 10*3/uL (ref 150–400)
RBC: 3.47 MIL/uL — ABNORMAL LOW (ref 4.22–5.81)
RDW: 17.5 % — AB (ref 11.5–15.5)
WBC: 10.8 10*3/uL — ABNORMAL HIGH (ref 4.0–10.5)

## 2017-11-14 LAB — I-STAT CG4 LACTIC ACID, ED: Lactic Acid, Venous: 0.94 mmol/L (ref 0.5–1.9)

## 2017-11-14 MED ORDER — IBUPROFEN 200 MG PO TABS
200.0000 mg | ORAL_TABLET | Freq: Three times a day (TID) | ORAL | Status: DC | PRN
  Administered 2017-11-15: 200 mg via ORAL
  Filled 2017-11-14: qty 1

## 2017-11-14 MED ORDER — AMLODIPINE BESYLATE 5 MG PO TABS
5.0000 mg | ORAL_TABLET | Freq: Every evening | ORAL | Status: DC
  Administered 2017-11-14 – 2017-11-15 (×2): 5 mg via ORAL
  Filled 2017-11-14 (×2): qty 1

## 2017-11-14 MED ORDER — OXYCODONE HCL 5 MG PO TABS
15.0000 mg | ORAL_TABLET | Freq: Four times a day (QID) | ORAL | Status: DC | PRN
  Administered 2017-11-14 – 2017-11-16 (×4): 15 mg via ORAL
  Filled 2017-11-14 (×4): qty 3

## 2017-11-14 MED ORDER — ASPIRIN 81 MG PO TBEC: 81.0000 mg | DELAYED_RELEASE_TABLET | Freq: Every day | ORAL | Status: DC

## 2017-11-14 MED ORDER — HYDRALAZINE HCL 25 MG PO TABS
25.0000 mg | ORAL_TABLET | Freq: Three times a day (TID) | ORAL | Status: DC
  Administered 2017-11-14 – 2017-11-16 (×6): 25 mg via ORAL
  Filled 2017-11-14 (×6): qty 1

## 2017-11-14 MED ORDER — METOPROLOL SUCCINATE ER 25 MG PO TB24
12.5000 mg | ORAL_TABLET | Freq: Every day | ORAL | Status: DC
  Administered 2017-11-15: 12.5 mg via ORAL
  Filled 2017-11-14 (×3): qty 1

## 2017-11-14 MED ORDER — PANTOPRAZOLE SODIUM 40 MG PO TBEC
40.0000 mg | DELAYED_RELEASE_TABLET | Freq: Every day | ORAL | Status: DC
  Administered 2017-11-14 – 2017-11-16 (×3): 40 mg via ORAL
  Filled 2017-11-14 (×3): qty 1

## 2017-11-14 MED ORDER — POLYETHYL GLYCOL-PROPYL GLYCOL 0.4-0.3 % OP SOLN: 1.0000 [drp] | Freq: Three times a day (TID) | OPHTHALMIC | Status: DC | PRN

## 2017-11-14 MED ORDER — HYPROMELLOSE (GONIOSCOPIC) 2.5 % OP SOLN
1.0000 [drp] | Freq: Three times a day (TID) | OPHTHALMIC | Status: DC | PRN
  Filled 2017-11-14: qty 15

## 2017-11-14 MED ORDER — POLYVINYL ALCOHOL 1.4 % OP SOLN
1.0000 [drp] | Freq: Three times a day (TID) | OPHTHALMIC | Status: DC | PRN
  Filled 2017-11-14: qty 15

## 2017-11-14 MED ORDER — METOPROLOL SUCCINATE ER 25 MG PO TB24: 25.0000 mg | ORAL_TABLET | Freq: Every day | ORAL | Status: DC

## 2017-11-14 MED ORDER — HEPARIN SODIUM (PORCINE) 5000 UNIT/ML IJ SOLN
5000.0000 [IU] | Freq: Three times a day (TID) | INTRAMUSCULAR | Status: DC
  Administered 2017-11-14 – 2017-11-15 (×2): 5000 [IU] via SUBCUTANEOUS
  Filled 2017-11-14 (×2): qty 1

## 2017-11-14 MED ORDER — IPRATROPIUM BROMIDE HFA 17 MCG/ACT IN AERS: 1.0000 | INHALATION_SPRAY | Freq: Two times a day (BID) | RESPIRATORY_TRACT | Status: DC | PRN

## 2017-11-14 MED ORDER — ADULT MULTIVITAMIN W/MINERALS CH
1.0000 | ORAL_TABLET | Freq: Every day | ORAL | Status: DC
  Administered 2017-11-15 – 2017-11-16 (×2): 1 via ORAL
  Filled 2017-11-14 (×3): qty 1

## 2017-11-14 MED ORDER — FIBER 625 MG PO TABS: ORAL_TABLET | Freq: Three times a day (TID) | ORAL | Status: DC

## 2017-11-14 MED ORDER — ROSUVASTATIN CALCIUM 10 MG PO TABS
10.0000 mg | ORAL_TABLET | Freq: Every day | ORAL | Status: DC
  Administered 2017-11-15 – 2017-11-16 (×2): 10 mg via ORAL
  Filled 2017-11-14 (×2): qty 1

## 2017-11-14 MED ORDER — IPRATROPIUM-ALBUTEROL 0.5-2.5 (3) MG/3ML IN SOLN: 3.0000 mL | Freq: Four times a day (QID) | RESPIRATORY_TRACT | Status: DC | PRN

## 2017-11-14 MED ORDER — ASPIRIN EC 81 MG PO TBEC
81.0000 mg | DELAYED_RELEASE_TABLET | Freq: Every day | ORAL | Status: DC
  Administered 2017-11-15 – 2017-11-16 (×2): 81 mg via ORAL
  Filled 2017-11-14 (×2): qty 1

## 2017-11-14 MED ORDER — ISOSORBIDE DINITRATE 10 MG PO TABS
10.0000 mg | ORAL_TABLET | Freq: Three times a day (TID) | ORAL | Status: DC
  Administered 2017-11-14 – 2017-11-16 (×4): 10 mg via ORAL
  Filled 2017-11-14 (×7): qty 1

## 2017-11-14 MED ORDER — FLUOXETINE HCL 20 MG PO CAPS
20.0000 mg | ORAL_CAPSULE | Freq: Two times a day (BID) | ORAL | Status: DC
  Administered 2017-11-14 – 2017-11-16 (×4): 20 mg via ORAL
  Filled 2017-11-14 (×5): qty 1

## 2017-11-14 MED ORDER — CENTRUM SILVER PO TABS: ORAL_TABLET | Freq: Every day | ORAL | Status: DC

## 2017-11-14 NOTE — H&P (Signed)
History and Physical    Jonathan Terry WJX:[id number] DOB: Nov 05, 1955 DOA: 11/14/2017  PCP: Shelbie Ammons, MD Consultants:  none Patient coming from: home- lives alone  Chief Complaint: Loss of consciousness  HPI: Jonathan Terry is a 62 y.o. male with medical history significant for HTN, chronic combined CHF, OSA, pulm HTN, h/o CVA, ESRD on HD, GERD, who presented to the ED today after syncopal event today. Had coffee and bread this morning and felt OK, but then took a shower and shortly thereafter felt dizzy and tired. Laid down on the couch and feels like he "went to sleep" for 30 minutes but is unsure if he went to sleep or lost consciousness. He had some general malaise and fatigue yesterday but otherwise has felt fine, no f/c/s, chest pain, palpitations, HA, SOB, N/V. He missed his usual HD session this morning. He does have a  H/o of prior PEA arrest thought to be 2/2 hypoxia from CHF exacerbation.   ED Course: HR consistently in the 50s BP 136-167/62-80 RR nL Afebrile "Feels fine" Labs all unremarkable other than BUN 63 Lactic acid 0.94  Review of Systems: As per HPI; otherwise review of systems reviewed and negative.   Ambulatory Status:  Ambulates without assistance  Past Medical History:  Diagnosis Date  . Anemia 01/29/2011  . Anemia   . Anginal pain (HCC)    none recently since started dialysis  . Arthritis    knees  . Chronic back pain   . Congestive heart failure (HCC) 01/29/2011   none since on dialysis  . CVA (cerebral vascular accident) (HCC)    no residual  . Dizziness 606/05/13  . Dyspnea    with exertion  . ESRD (end stage renal disease) on dialysis (HCC)   . GERD (gastroesophageal reflux disease)   . Hypertension   . Pulmonary hypertension (HCC)   . Renal failure (ARF), acute on chronic (HCC)    MWF [place]  . Sleep apnea   . Tobacco use     Past Surgical History:  Procedure Laterality Date  . AV FISTULA PLACEMENT  01/28/2011   Procedure:  ARTERIOVENOUS (AV) FISTULA CREATION;  Surgeon: Pryor Ochoa, MD;  Location: [facility] OR;  Service: Vascular;  Laterality: Left;  . AV FISTULA PLACEMENT  01/28/2011   Procedure: ARTERIOVENOUS (AV) FISTULA CREATION;  Surgeon: Chuck Hint, MD;  Location: [facility] OR;  Service: Vascular;;  . FISTULA SUPERFICIALIZATION Left 05/31/2017   Procedure: PLICATION OF ARTERIOVENOUS FISTULA LEFT ARM;  Surgeon: Chuck Hint, MD;  Location: [facility] OR;  Service: Vascular;  Laterality: Left;  . INTRAVASCULAR PRESSURE WIRE/FFR STUDY N/A 06/17/2016   Procedure: Intravascular Pressure Wire/FFR Study;  Surgeon: Kathleene Hazel, MD;  Location: [facility] INVASIVE CV LAB;  Service: Cardiovascular;  Laterality: N/A;  . REVISON OF ARTERIOVENOUS FISTULA Left 10/18/2017   Procedure: REVISION PLICATION OF ARTERIOVENOUS FISTULA Left ARM;  Surgeon: Chuck Hint, MD;  Location: [facility] OR;  Service: Vascular;  Laterality: Left;  . RIGHT/LEFT HEART CATH AND CORONARY ANGIOGRAPHY N/A 06/17/2016   Procedure: Right/Left Heart Cath and Coronary Angiography;  Surgeon: Kathleene Hazel, MD;  Location: [facility] INVASIVE CV LAB;  Service: Cardiovascular;  Laterality: N/A;    Social History   Socioeconomic History  . Marital status: Single    Spouse name: Not on file  . Number of children: Not on file  . Years of education: Not on file  . Highest education level: Not on file  Occupational History  . Occupation:  unemployed  Social Needs  . Financial resource strain: Not on file  . Food insecurity:    Worry: Not on file    Inability: Not on file  . Transportation needs:    Medical: Not on file    Non-medical: Not on file  Tobacco Use  . Smoking status: Former Games developer  . Smokeless tobacco: Never Used  . Tobacco comment: quit 2009 ish   Substance and Sexual Activity  . Alcohol use: No  . Drug use: No  . Sexual activity: Never  Lifestyle  . Physical activity:    Days per week: Not on file    Minutes per session: Not  on file  . Stress: Not on file  Relationships  . Social connections:    Talks on phone: Not on file    Gets together: Not on file    Attends religious service: Not on file    Active member of club or organization: Not on file    Attends meetings of clubs or organizations: Not on file    Relationship status: Not on file  . Intimate partner violence:    Fear of current or ex partner: Not on file    Emotionally abused: Not on file    Physically abused: Not on file    Forced sexual activity: Not on file  Other Topics Concern  . Not on file  Social History Narrative   ** Merged History Encounter **       Live alone. From [place]. No wife or children. Unemployed for the past 3 months. Use to work in Glass blower/designer.     No Known Allergies  Family History  Problem Relation Age of Onset  . Hypertension Father   . Kidney disease Father     Prior to Admission medications   Medication Sig Start Date End Date Taking? Authorizing Provider  amLODipine (NORVASC) 5 MG tablet Take 5 mg by mouth every evening.  07/25/17   [provider]  aspirin EC 81 MG EC tablet Take 1 tablet (81 mg total) by mouth daily. 06/21/16   Lonia Blood, MD  FIBER PO Take 1 capsule by mouth 3 (three) times daily.    [provider]  FLUoxetine (PROZAC) 20 MG capsule Take 20 mg by mouth 2 (two) times daily.    [provider]  hydrALAZINE (APRESOLINE) 25 MG tablet Take 1 tablet (25 mg total) by mouth every 8 (eight) hours. 06/20/16   Lonia Blood, MD  ibuprofen (ADVIL,MOTRIN) 200 MG tablet Take 200 mg by mouth every 8 (eight) hours as needed (for pain.).    [provider]  ipratropium (ATROVENT HFA) 17 MCG/ACT inhaler Inhale 1 puff into the lungs 2 (two) times daily as needed for wheezing.    [provider]  ipratropium-albuterol (DUONEB) 0.5-2.5 (3) MG/3ML SOLN Take 3 mLs by nebulization every 6 (six) hours. Dx: J44.9 Patient taking differently: Take 3  mLs by nebulization every 6 (six) hours as needed (for shortness of breath). Dx: J44.9 11/30/16   Parrett, Virgel Bouquet, NP  isosorbide dinitrate (ISORDIL) 10 MG tablet Take 1 tablet (10 mg total) by mouth 3 (three) times daily. 06/20/16   Lonia Blood, MD  LUBRICANT EYE DROPS 0.4-0.3 % SOLN Place 1 drop into both eyes 3 (three) times daily as needed (for dry eyes.).    [provider]  metoprolol succinate (TOPROL-XL) 25 MG 24 hr tablet Take 1 tablet (25 mg total) by mouth daily. Patient taking differently: Take 25  mg by mouth at bedtime.  06/21/16   Lonia Blood, MD  Multiple Vitamins-Minerals (CENTRUM SILVER PO) Take 1 tablet by mouth daily.    [provider]  omeprazole (PRILOSEC) 20 MG capsule Take 20 mg by mouth daily.    [provider]  oxyCODONE (ROXICODONE) 15 MG immediate release tablet Take 15 mg by mouth every 6 (six) hours as needed for pain. 12/07/16   [provider]  oxyCODONE (ROXICODONE) 5 MG immediate release tablet Take 1 tablet (5 mg total) by mouth every 4 (four) hours as needed. 10/18/17   Chuck Hint, MD  rosuvastatin (CRESTOR) 10 MG tablet Take 10 mg by mouth daily.    [provider]  tetrahydrozoline 0.05 % ophthalmic solution Place 1 drop into both eyes 3 (three) times daily as needed (for red eyes.).    [provider]    Physical Exam: Vitals:   11/14/17 1430 11/14/17 1445 11/14/17 1500 11/14/17 1515  BP: (!) 152/74 (!) 147/75 (!) 160/76 (!) 166/77  Pulse: (!) 52 (!) 54 (!) 54 (!) 53  Resp: 16 16 14 16   Temp:      TempSrc:      SpO2: 96% 99% 94% 96%  Weight:      Height:         . General:  Appears calm and comfortable and is in NAD . Eyes:  PERRL, EOMI, normal lids, iris . ENT:  grossly normal hearing, lips & tongue, mmm; dentures . Neck:  no LAD, masses or thyromegaly; no carotid bruits . Cardiovascular: normal to bradycardic rate, reg rhythm, no murmur. Marland Kitchen Respiratory:   CTA  bilaterally with no wheezes/rales/rhonchi.  Normal respiratory effort. . Abdomen:  soft, ND, NABS. Mild RUQ tenderness to palpation. No R/G, no organomegaly or mass . Back:   grossly normal alignment, no CVAT . Skin:  no rash or induration seen on limited exam . Musculoskeletal:  grossly normal tone BUE/BLE, good ROM, no bony abnormality or obvious joint deformity . Lower extremity:  No LE edema.  Limited foot exam with no ulcerations.  2+ distal pulses. Marland Kitchen Psychiatric:  grossly normal mood and affect, speech fluent and appropriate, AOx3 . Neurologic:  CN 2-12 grossly intact, moves all extremities in coordinated fashion, sensation intact    Radiological Exams on Admission: No results found.  EKG: Independently reviewed.  NSR with rate 50 Sinus bradycardia Rightward axis ST & T wave abnormality, consider inferior ischemia ST & T wave abnormality, consider anterior ischemia Prolonged QT Abnormal ECG No significant change since last tracing   Labs on Admission: I have personally reviewed the available labs and imaging studies at the time of the admission.  Pertinent labs:  Sodium 135 potassium 4.4 chloride 99 CO2 22 glucose 111 BUN 63 creatinine 7.88 calcium 9.7 WBC 10.8 hemoglobin 10.3 platelets 210 Actiq acid 0.94    Assessment/Plan Principal Problem:   Syncope Active Problems:   Chronic combined systolic and diastolic heart failure (HCC)   Anemia   End stage renal disease (HCC)   Essential hypertension   CAD (coronary artery disease), native coronary artery   COPD (chronic obstructive pulmonary disease) (HCC)   OSA (obstructive sleep apnea)  Possible Syncopal episode: with h/o PEA arrest, will err on the side of caution and admit to observation for monitoring. He is not hypoxic, vitals are stable, but he has been borderline bradycardic while here. -telemetry -cycle troponins -TTE -will decrease Toprol XL to 12.5 mg (from 25 mg)  Chronic combined  systolic and  diastolic CHF, h/o CAD -appears euvolemic -cont beta blocker, cut dose in half as above for borderline bradycardia -cont hydralazine/isordil -cont statin -cont ASA 81 mg  ESRD on HD M/W/F -Nephrology consulted; will likely wait until tomorrow morning to dialyze -Daily BMP  HTN -Cont amlodipine, hydralazine, isordil, Toprol XL (reduced dose)  COPD -cont atrovent, duonebs prn  Anemia of chronic kidney disease -Hemoglobin baseline  OSA -Cont CPAP qHS  GERD -cont PPI  DVT prophylaxis: Heparin Code Status:  Full - confirmed with patient/family Family Communication: none  Disposition Plan:  Home once clinically improved Consults called: renal  Admission status: It is my clinical opinion that referral for OBSERVATION is reasonable and necessary in this patient based on the above information provided. The aforementioned taken together are felt to place the patient at high risk for further clinical deterioration. However it is anticipated that the patient may be medically stable for discharge from the hospital within 24 to 48 hours.     Elyse Hsu MD [facility]  If note is complete, please contact covering daytime or nighttime physician. [email or web address] Password [facility]  11/14/2017, 3:36 PM

## 2017-11-14 NOTE — ED Notes (Signed)
Did ekg shown to Dr Adela Lank patient is resting

## 2017-11-14 NOTE — ED Notes (Signed)
Attempted to call report

## 2017-11-14 NOTE — ED Notes (Signed)
Pt does not make urine.

## 2017-11-14 NOTE — ED Provider Notes (Signed)
[facility] EMERGENCY DEPARTMENT Provider Note   CSN: [id number] Arrival date & time: 11/14/17  1253     History   Chief Complaint Chief Complaint  Patient presents with  . Loss of Consciousness    HPI Antar A Elsass is a 62 y.o. male.  62 yo M with a chief complaints of a syncopal event.  Patient states that he took a shower got out felt unwell and sat on his couch and then he woke up about 30 minutes later.  He denies chest pain or shortness of breath denies headache or neck pain.  Denies abdominal pain.  Now he feels much better.  Was supposed to go to dialysis today.   The history is provided by the patient.  Illness  This is a new problem. The current episode started less than 1 hour ago. The problem occurs rarely. The problem has been resolved. Pertinent negatives include no chest pain, no abdominal pain, no headaches and no shortness of breath. Nothing aggravates the symptoms. Nothing relieves the symptoms. He has tried nothing for the symptoms. The treatment provided no relief.    Past Medical History:  Diagnosis Date  . Anemia 01/29/2011  . Anemia   . Anginal pain (HCC)    none recently since started dialysis  . Arthritis    knees  . Chronic back pain   . Congestive heart failure (HCC) 01/29/2011   none since on dialysis  . CVA (cerebral vascular accident) (HCC)    no residual  . Dizziness 606/05/13  . Dyspnea    with exertion  . ESRD (end stage renal disease) on dialysis (HCC)   . GERD (gastroesophageal reflux disease)   . Hypertension   . Pulmonary hypertension (HCC)   . Renal failure (ARF), acute on chronic (HCC)    MWF [place]  . Sleep apnea   . Tobacco use     Patient Active Problem List   Diagnosis Date Noted  . OSA (obstructive sleep apnea) 09/22/2016  . COPD (chronic obstructive pulmonary disease) (HCC) 07/15/2016  . CAD (coronary artery disease), native coronary artery   . Pulmonary hypertension, moderate to severe (HCC)  06/15/2016  . Essential hypertension 06/15/2016  . Cardiac arrest (HCC) 06/14/2016  . Hypertension 03/13/2011  . End stage renal disease (HCC) 02/23/2011  . Chronic combined systolic and diastolic heart failure (HCC) 01/29/2011  . Anemia 01/29/2011  . Metabolic acidosis 01/29/2011  . Hypertensive urgency 01/25/2011    Past Surgical History:  Procedure Laterality Date  . AV FISTULA PLACEMENT  01/28/2011   Procedure: ARTERIOVENOUS (AV) FISTULA CREATION;  Surgeon: Pryor Ochoa, MD;  Location: [facility] OR;  Service: Vascular;  Laterality: Left;  . AV FISTULA PLACEMENT  01/28/2011   Procedure: ARTERIOVENOUS (AV) FISTULA CREATION;  Surgeon: Chuck Hint, MD;  Location: [facility] OR;  Service: Vascular;;  . FISTULA SUPERFICIALIZATION Left 05/31/2017   Procedure: PLICATION OF ARTERIOVENOUS FISTULA LEFT ARM;  Surgeon: Chuck Hint, MD;  Location[facility] OR;  Service: Vascular;  Laterality: Left;  . INTRAVASCULAR PRESSURE WIRE/FFR STUDY N/A 06/17/2016   Procedure: Intravascular Pressure Wire/FFR Study;  Surgeon: Kathleene Hazel, MD;  Location: [facility] INVASIVE CV LAB;  Service: Cardiovascular;  Laterality: N/A;  . REVISON OF ARTERIOVENOUS FISTULA Left 10/18/2017   Procedure: REVISION PLICATION OF ARTERIOVENOUS FISTULA Left ARM;  Surgeon: Chuck Hint, MD;  Location: [facility] OR;  Service: Vascular;  Laterality: Left;  . RIGHT/LEFT HEART CATH AND CORONARY ANGIOGRAPHY N/A 06/17/2016   Procedure: Right/Left Heart  Cath and Coronary Angiography;  Surgeon: Kathleene Hazel, MD;  Location: [facility] INVASIVE CV LAB;  Service: Cardiovascular;  Laterality: N/A;        Home Medications    Prior to Admission medications   Medication Sig Start Date End Date Taking? Authorizing Provider  amLODipine (NORVASC) 5 MG tablet Take 5 mg by mouth every evening.  07/25/17   [provider]  aspirin EC 81 MG EC tablet Take 1 tablet (81 mg total) by mouth daily. 06/21/16   Lonia Blood, MD  FIBER  PO Take 1 capsule by mouth 3 (three) times daily.    [provider]  FLUoxetine (PROZAC) 20 MG capsule Take 20 mg by mouth 2 (two) times daily.    [provider]  hydrALAZINE (APRESOLINE) 25 MG tablet Take 1 tablet (25 mg total) by mouth every 8 (eight) hours. 06/20/16   Lonia Blood, MD  ibuprofen (ADVIL,MOTRIN) 200 MG tablet Take 200 mg by mouth every 8 (eight) hours as needed (for pain.).    [provider]  ipratropium (ATROVENT HFA) 17 MCG/ACT inhaler Inhale 1 puff into the lungs 2 (two) times daily as needed for wheezing.    [provider]  ipratropium-albuterol (DUONEB) 0.5-2.5 (3) MG/3ML SOLN Take 3 mLs by nebulization every 6 (six) hours. Dx: J44.9 Patient taking differently: Take 3 mLs by nebulization every 6 (six) hours as needed (for shortness of breath). Dx: J44.9 11/30/16   Parrett, Virgel Bouquet, NP  isosorbide dinitrate (ISORDIL) 10 MG tablet Take 1 tablet (10 mg total) by mouth 3 (three) times daily. 06/20/16   Lonia Blood, MD  LUBRICANT EYE DROPS 0.4-0.3 % SOLN Place 1 drop into both eyes 3 (three) times daily as needed (for dry eyes.).    [provider]  metoprolol succinate (TOPROL-XL) 25 MG 24 hr tablet Take 1 tablet (25 mg total) by mouth daily. Patient taking differently: Take 25 mg by mouth at bedtime.  06/21/16   Lonia Blood, MD  Multiple Vitamins-Minerals (CENTRUM SILVER PO) Take 1 tablet by mouth daily.    [provider]  omeprazole (PRILOSEC) 20 MG capsule Take 20 mg by mouth daily.    [provider]  oxyCODONE (ROXICODONE) 15 MG immediate release tablet Take 15 mg by mouth every 6 (six) hours as needed for pain. 12/07/16   [provider]  oxyCODONE (ROXICODONE) 5 MG immediate release tablet Take 1 tablet (5 mg total) by mouth every 4 (four) hours as needed. 10/18/17   Chuck Hint, MD  rosuvastatin (CRESTOR) 10 MG tablet Take 10 mg by mouth daily.    [provider]    tetrahydrozoline 0.05 % ophthalmic solution Place 1 drop into both eyes 3 (three) times daily as needed (for red eyes.).    [provider]    Family History Family History  Problem Relation Age of Onset  . Hypertension Father   . Kidney disease Father     Social History Social History   Tobacco Use  . Smoking status: Former Games developer  . Smokeless tobacco: Never Used  . Tobacco comment: quit 2009 ish   Substance Use Topics  . Alcohol use: No  . Drug use: No     Allergies   Patient has no known allergies.   Review of Systems Review of Systems  Constitutional: Negative for chills and fever.  HENT: Negative for congestion and facial swelling.   Eyes: Negative for discharge and visual disturbance.  Respiratory: Negative for  shortness of breath.   Cardiovascular: Negative for chest pain and palpitations.  Gastrointestinal: Negative for abdominal pain, diarrhea and vomiting.  Musculoskeletal: Negative for arthralgias and myalgias.  Skin: Negative for color change and rash.  Neurological: Positive for syncope. Negative for tremors and headaches.  Psychiatric/Behavioral: Negative for confusion and dysphoric mood.     Physical Exam Updated Vital Signs BP (!) 167/79   Pulse (!) 54   Temp 98.6 F (37 C) (Oral)   Resp 16   Ht 5\' 5"  (1.651 m)   Wt 64 kg   SpO2 95%   BMI 23.48 kg/m   Physical Exam  Constitutional: He is oriented to person, place, and time. He appears well-developed and well-nourished.  HENT:  Head: Normocephalic and atraumatic.  Eyes: Pupils are equal, round, and reactive to light. EOM are normal.  Neck: Normal range of motion. Neck supple. No JVD present.  Cardiovascular: Normal rate and regular rhythm. Exam reveals no gallop and no friction rub.  No murmur heard. Pulmonary/Chest: No respiratory distress. He has no wheezes.  Abdominal: He exhibits no distension. There is no rebound and no guarding.  Musculoskeletal: Normal range of motion.   L av fistual with palpable thrill  Neurological: He is alert and oriented to person, place, and time.  Skin: No rash noted. No pallor.  Psychiatric: He has a normal mood and affect. His behavior is normal.  Nursing note and vitals reviewed.    ED Treatments / Results  Labs (all labs ordered are listed, but only abnormal results are displayed) Labs Reviewed  BASIC METABOLIC PANEL - Abnormal; Notable for the following components:      Result Value   Glucose, Bld 111 (*)    BUN 63 (*)    Creatinine, Ser 7.88 (*)    GFR calc non Af Amer 6 (*)    GFR calc Af Amer 7 (*)    All other components within normal limits  CBC - Abnormal; Notable for the following components:   WBC 10.8 (*)    RBC 3.47 (*)    Hemoglobin 10.3 (*)    HCT 31.4 (*)    RDW 17.5 (*)    All other components within normal limits  URINALYSIS, ROUTINE W REFLEX MICROSCOPIC  CBG MONITORING, ED  I-STAT CHEM 8, ED  I-STAT CG4 LACTIC ACID, ED    EKG EKG Interpretation  Date/Time:  Monday November 14 2017 12:58:34 EST Ventricular Rate:  50 PR Interval:  156 QRS Duration: 108 QT Interval:  574 QTC Calculation: 523 R Axis:   106 Text Interpretation:  Sinus bradycardia Rightward axis ST & T wave abnormality, consider inferior ischemia ST & T wave abnormality, consider anterior ischemia Prolonged QT Abnormal ECG No significant change since last tracing Confirmed by Melene Plan [phone]) on 11/14/2017 1:04:55 PM Also confirmed by Melene Plan [phone]), editor Sheppard Evens ([id number])  on 11/14/2017 2:36:33 PM   Radiology No results found.  Procedures Procedures (including critical care time)  Medications Ordered in ED Medications  CENTRUM SILVER (has no administration in time range)  ipratropium-albuterol (DUONEB) 0.5-2.5 (3) MG/3ML nebulizer solution 3 mL (has no administration in time range)  oxyCODONE (Oxy IR/ROXICODONE) immediate release tablet 15 mg (has no administration in time range)  rosuvastatin (CRESTOR)  tablet 10 mg (has no administration in time range)  FLUoxetine (PROZAC) capsule 20 mg (has no administration in time range)  pantoprazole (PROTONIX) EC tablet 40 mg (has no administration in time range)  Fiber TABS (has no administration in  time range)  ipratropium (ATROVENT HFA) inhaler 1 puff (has no administration in time range)  aspirin EC tablet 81 mg (has no administration in time range)  hydrALAZINE (APRESOLINE) tablet 25 mg (has no administration in time range)  isosorbide dinitrate (ISORDIL) tablet 10 mg (has no administration in time range)  metoprolol succinate (TOPROL-XL) 24 hr tablet 25 mg (has no administration in time range)  amLODipine (NORVASC) tablet 5 mg (has no administration in time range)  Polyethyl Glycol-Propyl Glycol 0.4-0.3 % SOLN 1 drop (has no administration in time range)  ibuprofen (ADVIL,MOTRIN) tablet 200 mg (has no administration in time range)     Initial Impression / Assessment and Plan / ED Course  I have reviewed the triage vital signs and the nursing notes.  Pertinent labs & imaging results that were available during my care of the patient were reviewed by me and considered in my medical decision making (see chart for details).     62 yo M with a chief complaint of a syncopal event.  The patient felt bad and then sat down on the couch and then thinks he was out for approximately 30 minutes.  Now is back to baseline.  My concern is that the patient has a history of cardiac arrest.  I will discuss with the hospitalist for possible observation.  Otherwise the patient's lab work is been unremarkable.  His EKG looks similar to that back in August.  The patients results and plan were reviewed and discussed.   Any x-rays performed were independently reviewed by myself.   Differential diagnosis were considered with the presenting HPI.  Medications  CENTRUM SILVER (has no administration in time range)  ipratropium-albuterol (DUONEB) 0.5-2.5 (3) MG/3ML  nebulizer solution 3 mL (has no administration in time range)  oxyCODONE (Oxy IR/ROXICODONE) immediate release tablet 15 mg (has no administration in time range)  rosuvastatin (CRESTOR) tablet 10 mg (has no administration in time range)  FLUoxetine (PROZAC) capsule 20 mg (has no administration in time range)  pantoprazole (PROTONIX) EC tablet 40 mg (has no administration in time range)  Fiber TABS (has no administration in time range)  ipratropium (ATROVENT HFA) inhaler 1 puff (has no administration in time range)  aspirin EC tablet 81 mg (has no administration in time range)  hydrALAZINE (APRESOLINE) tablet 25 mg (has no administration in time range)  isosorbide dinitrate (ISORDIL) tablet 10 mg (has no administration in time range)  metoprolol succinate (TOPROL-XL) 24 hr tablet 25 mg (has no administration in time range)  amLODipine (NORVASC) tablet 5 mg (has no administration in time range)  Polyethyl Glycol-Propyl Glycol 0.4-0.3 % SOLN 1 drop (has no administration in time range)  ibuprofen (ADVIL,MOTRIN) tablet 200 mg (has no administration in time range)    Vitals:   11/14/17 1500 11/14/17 1515 11/14/17 1530 11/14/17 1545  BP: (!) 160/76 (!) 166/77 (!) 150/80 (!) 167/79  Pulse: (!) 54 (!) 53 (!) 53 (!) 54  Resp: 14 16 16 16   Temp:      TempSrc:      SpO2: 94% 96% 96% 95%  Weight:      Height:        Final diagnoses:  Syncope and collapse    Admission/ observation were discussed with the admitting physician, patient and/or family and they are comfortable with the plan.    Final Clinical Impressions(s) / ED Diagnoses   Final diagnoses:  Syncope and collapse    ED Discharge Orders    None  Melene Plan, DO 11/14/17 [phone]

## 2017-11-14 NOTE — ED Notes (Signed)
Checked patient cbg it was 95 notified RN of blood sugar

## 2017-11-14 NOTE — ED Notes (Addendum)
Dr. Morrison Old aware of troponin.

## 2017-11-14 NOTE — ED Notes (Signed)
MD at bedsdie

## 2017-11-14 NOTE — ED Triage Notes (Signed)
Pt became lightheaded while in the shower this morning. He exited the shower, sat down at around 0930 then woke up at 10am on the floor of the living room. Pt unaware of how he got to the floor per EMS. Pt then went to dialysis and c/o RUQ abdominal pain. 2 small episodes of emesis at dialysis. Dialysis nurse called EMS when they noticed HR 40s. Denies chest pain, neck/back pain. C/o bilateral shoulder pain for EMS. Dialysis MWF.

## 2017-11-15 ENCOUNTER — Observation Stay (HOSPITAL_BASED_OUTPATIENT_CLINIC_OR_DEPARTMENT_OTHER): Payer: Medicare Other

## 2017-11-15 ENCOUNTER — Observation Stay (HOSPITAL_COMMUNITY): Payer: Medicare Other

## 2017-11-15 ENCOUNTER — Other Ambulatory Visit (HOSPITAL_COMMUNITY): Payer: Medicare Other

## 2017-11-15 DIAGNOSIS — I5030 Unspecified diastolic (congestive) heart failure: Secondary | ICD-10-CM | POA: Diagnosis not present

## 2017-11-15 DIAGNOSIS — R55 Syncope and collapse: Secondary | ICD-10-CM | POA: Diagnosis not present

## 2017-11-15 LAB — CBC
HCT: 28.3 % — ABNORMAL LOW (ref 39.0–52.0)
Hemoglobin: 9.2 g/dL — ABNORMAL LOW (ref 13.0–17.0)
MCH: 29 pg (ref 26.0–34.0)
MCHC: 32.5 g/dL (ref 30.0–36.0)
MCV: 89.3 fL (ref 80.0–100.0)
Platelets: 207 10*3/uL (ref 150–400)
RBC: 3.17 MIL/uL — ABNORMAL LOW (ref 4.22–5.81)
RDW: 17.4 % — ABNORMAL HIGH (ref 11.5–15.5)
WBC: 7.4 10*3/uL (ref 4.0–10.5)
nRBC: 0 % (ref 0.0–0.2)

## 2017-11-15 LAB — BASIC METABOLIC PANEL
Anion gap: 13 (ref 5–15)
BUN: 70 mg/dL — ABNORMAL HIGH (ref 8–23)
CO2: 25 mmol/L (ref 22–32)
Calcium: 9.3 mg/dL (ref 8.9–10.3)
Chloride: 99 mmol/L (ref 98–111)
Creatinine, Ser: 8.83 mg/dL — ABNORMAL HIGH (ref 0.61–1.24)
GFR calc Af Amer: 6 mL/min — ABNORMAL LOW (ref >60)
GFR calc non Af Amer: 6 mL/min — ABNORMAL LOW (ref >60)
Glucose, Bld: 109 mg/dL — ABNORMAL HIGH (ref 70–99)
Potassium: 4.4 mmol/L (ref 3.5–5.1)
Sodium: 137 mmol/L (ref 135–145)

## 2017-11-15 LAB — MRSA PCR SCREENING: MRSA by PCR: NEGATIVE

## 2017-11-15 LAB — HIV ANTIBODY (ROUTINE TESTING W REFLEX): HIV Screen 4th Generation wRfx: NONREACTIVE

## 2017-11-15 MED ORDER — CALCITRIOL 0.5 MCG PO CAPS
1.0000 ug | ORAL_CAPSULE | ORAL | Status: DC
  Administered 2017-11-16: 1 ug via ORAL

## 2017-11-15 MED ORDER — CHLORHEXIDINE GLUCONATE CLOTH 2 % EX PADS: 6.0000 | MEDICATED_PAD | Freq: Every day | CUTANEOUS | Status: DC

## 2017-11-15 MED ORDER — CALCITRIOL 0.5 MCG PO CAPS
1.0000 ug | ORAL_CAPSULE | Freq: Every day | ORAL | Status: AC
  Administered 2017-11-15: 1 ug via ORAL
  Filled 2017-11-15: qty 2

## 2017-11-15 MED ORDER — IOPAMIDOL (ISOVUE-370) INJECTION 76%
100.0000 mL | Freq: Once | INTRAVENOUS | Status: AC | PRN
  Administered 2017-11-15: 100 mL via INTRAVENOUS

## 2017-11-15 MED ORDER — HEPARIN BOLUS VIA INFUSION
3500.0000 [IU] | Freq: Once | INTRAVENOUS | Status: AC
  Administered 2017-11-15: 3500 [IU] via INTRAVENOUS
  Filled 2017-11-15: qty 3500

## 2017-11-15 MED ORDER — HEPARIN (PORCINE) IN NACL 100-0.45 UNIT/ML-% IJ SOLN
1000.0000 [IU]/h | INTRAMUSCULAR | Status: DC
  Administered 2017-11-15: 1000 [IU]/h via INTRAVENOUS
  Filled 2017-11-15: qty 250

## 2017-11-15 MED ORDER — IOPAMIDOL (ISOVUE-370) INJECTION 76%
INTRAVENOUS | Status: AC
  Filled 2017-11-15: qty 100

## 2017-11-15 NOTE — Progress Notes (Signed)
  Echocardiogram 2D Echocardiogram has been performed.  Jonathan Terry 11/15/2017, 3:06 PM

## 2017-11-15 NOTE — Progress Notes (Signed)
Patient refusing CPAP. States he doesn't use it. Informed patient to let the RN know to call RT if he changed his mind.

## 2017-11-15 NOTE — Progress Notes (Signed)
SATURATION QUALIFICATIONS: (This note is used to comply with regulatory documentation for home oxygen)  Patient Saturations on Room Air at Rest = 98 %  Patient Saturations on Room Air while Ambulating = 97 %  Patient Saturations on0 Liters of oxygen while Ambulating = none  Please briefly explain why patient needs home oxygen:pt resp rate 16-20 walking, no distress, limited due to pain in knees but no dizziness or cp

## 2017-11-15 NOTE — Procedures (Signed)
I was present at this dialysis session, have reviewed the session itself and made  appropriate changes.  UFG 1.5L 135/57 Qb 400 AVF  Estill Bakes MD [facility] pager [phone]   11/15/2017, 8:32 AM

## 2017-11-15 NOTE — Progress Notes (Signed)
ECHO reviewed. Severe Pulm hypertension.  No obvious strain documented in report. PAP 53 was 59 when he had PE in 2018.  Given hx, will check CTA of chest STAT and start Heparin drip STAT.

## 2017-11-15 NOTE — Progress Notes (Signed)
Patient refuses CPAP 

## 2017-11-15 NOTE — Progress Notes (Signed)
Paged Dr Willette Pa that echo results are in epic asorderd

## 2017-11-15 NOTE — Discharge Summary (Signed)
Physician Discharge Summary  [name]:[id number] DOB: 1955/10/20 DOA: 11/14/2017  PCP: Shelbie Ammons, MD  Admit date: 11/14/2017 Discharge date: 11/15/2017  Time spent: 45 minutes  Recommendations for Outpatient Follow-up:  1. Follow up with PCP for post hospitalization visit 2. Continue regular dialysis schedule   Discharge Diagnoses:  Principal Problem:   Syncope Active Problems:   Chronic combined systolic and diastolic heart failure (HCC)   Anemia   End stage renal disease (HCC)   Essential hypertension   CAD (coronary artery disease), native coronary artery   COPD (chronic obstructive pulmonary disease) (HCC)   OSA (obstructive sleep apnea)   Discharge Condition: stable  Diet recommendation: heart healthy/renal  Filed Weights   11/15/17 0435 11/15/17 0815 11/15/17 1228  Weight: 64.7 kg 64.9 kg 63.9 kg    History of present illness:  Jonathan Terry is a 62 y.o. male with ESRD on HD MWF ([place]),  HTN, combined HF, OSA, pulm HTN, h/o CVA who was admitted following syncopal event on 11/14/2017.  He felt dizzy following a shower and laid on the couch and either was asleep or had LOC. It remains unclear which.   Other than fatigue he didn't have any associated symptoms such as fevers, chills, chest pain, abd issues. No issues with dialysis. He has hx of PEA and therefore kept for observation over night.      Hospital Course:  1.  Syncope:  Being monitored in observation mainly due to h/o PEA arrest.  So far evaluation is unrevealing. HR has been in the 50s so his toprol was decreased from 25 to 12.5.  Follow up with PCP 1-2 weeks  2.  ESRD on HD: MWF [place], dialyzed the day of discharge. EDW in orders listed as 64.5kg but he says it's 64kg.  Tolerated dialysis on day of discharge    3.  Anemia:  Hb 9.2, monitor. Mircera 75 q4wks last dose 10/30. Iron sat 33% 10/23.    5.  Combined heart failure + h/o CAD: appears compensated.  On hydral/isordil,  statin, ASA.   6.  HTN:  Controlled. On amlodipine, hydralazine, isordil, and toprol at lower dose.  BP high yesterday evening but now normal.   Procedures:  Dialysis 11/15/17  Consultations:  [facility] Fellows nephrology  Discharge Exam: Vitals:   11/15/17 1228 11/15/17 1323  BP: (!) 165/79 (!) 176/72  Pulse: 64 67  Resp: 14 18  Temp: 98 F (36.7 C) 98.5 F (36.9 C)  SpO2: 96% 98%    General: well nourished Cardiovascular: rrr no mgr no LE edema Respiratory: normal effort BS clear bilaterally no wheeze  Discharge Instructions    Allergies as of 11/15/2017   No Known Allergies     Medication List    TAKE these medications   amLODipine 5 MG tablet Commonly known as:  NORVASC Take 5 mg by mouth every evening.   aspirin 81 MG EC tablet Take 1 tablet (81 mg total) by mouth daily.   CENTRUM SILVER PO Take 1 tablet by mouth daily.   FIBER PO Take 1 capsule by mouth 3 (three) times daily.   FLUoxetine 20 MG capsule Commonly known as:  PROZAC Take 20 mg by mouth 2 (two) times daily.   hydrALAZINE 25 MG tablet Commonly known as:  APRESOLINE Take 1 tablet (25 mg total) by mouth every 8 (eight) hours.   ibuprofen 200 MG tablet Commonly known as:  ADVIL,MOTRIN Take 200 mg by mouth every 8 (eight) hours as needed (for  pain.).   ipratropium 17 MCG/ACT inhaler Commonly known as:  ATROVENT HFA Inhale 1 puff into the lungs 2 (two) times daily as needed for wheezing.   ipratropium-albuterol 0.5-2.5 (3) MG/3ML Soln Commonly known as:  DUONEB Take 3 mLs by nebulization every 6 (six) hours. Dx: J44.9 What changed:    when to take this  reasons to take this   isosorbide dinitrate 10 MG tablet Commonly known as:  ISORDIL Take 1 tablet (10 mg total) by mouth 3 (three) times daily.   LUBRICANT EYE DROPS 0.4-0.3 % Soln Generic drug:  Polyethyl Glycol-Propyl Glycol Place 1 drop into both eyes 3 (three) times daily as needed (for dry eyes.).   metoprolol succinate 25 MG  24 hr tablet Commonly known as:  TOPROL-XL Take 1 tablet (25 mg total) by mouth daily. What changed:  when to take this   omeprazole 20 MG capsule Commonly known as:  PRILOSEC Take 20 mg by mouth daily.   oxyCODONE 15 MG immediate release tablet Commonly known as:  ROXICODONE Take 15 mg by mouth every 6 (six) hours as needed for pain.   oxyCODONE 5 MG immediate release tablet Commonly known as:  Oxy IR/ROXICODONE Take 1 tablet (5 mg total) by mouth every 4 (four) hours as needed.   rosuvastatin 10 MG tablet Commonly known as:  CRESTOR Take 10 mg by mouth daily.   tetrahydrozoline 0.05 % ophthalmic solution Place 1 drop into both eyes 3 (three) times daily as needed (for red eyes.).      No Known Allergies    The results of significant diagnostics from this hospitalization (including imaging, microbiology, ancillary and laboratory) are listed below for reference.    Significant Diagnostic Studies: No results found.  Microbiology: Recent Results (from the past 240 hour(s))  MRSA PCR Screening     Status: None   Collection Time: 11/14/17 11:56 PM  Result Value Ref Range Status   MRSA by PCR NEGATIVE NEGATIVE Final    Comment:        The GeneXpert MRSA Assay (FDA approved for NASAL specimens only), is one component of a comprehensive MRSA colonization surveillance program. It is not intended to diagnose MRSA infection nor to guide or monitor treatment for MRSA infections. Performed at [facility], [address] [place], [place] [address]      Labs: Basic Metabolic Panel: Recent Labs  Lab 11/14/17 1258 11/15/17 0409  NA 135 137  K 4.4 4.4  CL 99 99  CO2 22 25  GLUCOSE 111* 109*  BUN 63* 70*  CREATININE 7.88* 8.83*  CALCIUM 9.7 9.3   Liver Function Tests: Recent Labs  Lab 11/14/17 1647  AST 25  ALT 17  ALKPHOS 104  BILITOT 1.2  PROT 8.1  ALBUMIN 3.3*   No results for input(s): LIPASE, AMYLASE in the last 168 hours. No results for  input(s): AMMONIA in the last 168 hours. CBC: Recent Labs  Lab 11/14/17 1258 11/15/17 0409  WBC 10.8* 7.4  HGB 10.3* 9.2*  HCT 31.4* 28.3*  MCV 90.5 89.3  PLT 210 207   Cardiac Enzymes: Recent Labs  Lab 11/14/17 1647 11/14/17 2218  TROPONINI 0.09* 0.07*   BNP: BNP (last 3 results) No results for input(s): BNP in the last 8760 hours.  ProBNP (last 3 results) No results for input(s): PROBNP in the last 8760 hours.  CBG: Recent Labs  Lab 11/14/17 1449  GLUCAP 95       Signed:  Gwenyth Bender NP [facility] 11/15/2017, 3:56 PM

## 2017-11-15 NOTE — Consult Note (Signed)
[facility] Renal Consultation Note  Requesting MD: Dr. Steffanie Dunn Indication for Consultation: provision of dialysis and management of related conditions in ESRD patient  HPI: Jonathan Terry is a 62 y.o. male with ESRD on HD MWF ([place]),  HTN, combined HF, OSA, pulm HTN, h/o CVA who is admitted following syncopal event yesterday.  He felt dizzy following a shower yesterday and laid on the couch and either was asleep or had LOC.  Other than fatigue he didn't have any associated symptoms such as fevers, chills, chest pain, abd issues.  He reports that lately dialysis has been going fine.     Creatinine, Ser  Date/Time Value Ref Range Status  11/15/2017 04:09 AM 8.83 (H) 0.61 - 1.24 mg/dL Final  11/14/2017 12:58 PM 7.88 (H) 0.61 - 1.24 mg/dL Final  06/20/2016 04:44 AM 4.86 (H) 0.61 - 1.24 mg/dL Final  06/19/2016 04:17 AM 5.95 (H) 0.61 - 1.24 mg/dL Final  06/18/2016 04:01 AM 4.74 (H) 0.61 - 1.24 mg/dL Final  06/17/2016 04:07 AM 6.92 (H) 0.61 - 1.24 mg/dL Final  06/16/2016 05:00 AM 5.76 (H) 0.61 - 1.24 mg/dL Final  06/15/2016 10:57 AM 7.89 (H) 0.61 - 1.24 mg/dL Final  06/14/2016 08:41 PM 7.37 (H) 0.61 - 1.24 mg/dL Final  06/14/2016 03:43 PM 7.22 (H) 0.61 - 1.24 mg/dL Final  06/14/2016 11:32 AM 6.80 (H) 0.61 - 1.24 mg/dL Final  06/16/2011 10:13 AM 7.60 (H) 0.50 - 1.35 mg/dL Final  03/20/2011 07:00 AM 6.20 (H) 0.50 - 1.35 mg/dL Final  03/19/2011 05:45 AM 5.18 (H) 0.50 - 1.35 mg/dL Final    Comment:    DELTA CHECK NOTED  03/18/2011 07:08 PM 8.56 (H) 0.50 - 1.35 mg/dL Final  03/18/2011 05:45 AM 8.39 (H) 0.50 - 1.35 mg/dL Final  03/17/2011 04:15 AM 7.67 (H) 0.50 - 1.35 mg/dL Final  03/16/2011 02:59 PM 9.33 (H) 0.50 - 1.35 mg/dL Final  03/16/2011 04:05 AM 9.10 (H) 0.50 - 1.35 mg/dL Final  03/15/2011 08:30 AM 12.08 (H) 0.50 - 1.35 mg/dL Final  03/14/2011 06:20 AM 12.16 (H) 0.50 - 1.35 mg/dL Final  03/13/2011 11:32 PM 12.29 (H) 0.50 - 1.35 mg/dL Final  01/29/2011 05:00 AM 9.18  (H) 0.50 - 1.35 mg/dL Final  01/28/2011 05:58 AM 8.97 (H) 0.50 - 1.35 mg/dL Final  01/27/2011 09:24 AM 8.84 (H) 0.50 - 1.35 mg/dL Final  01/27/2011 06:18 AM 8.80 (H) 0.50 - 1.35 mg/dL Final  01/26/2011 05:40 AM 8.75 (H) 0.50 - 1.35 mg/dL Final  01/25/2011 09:16 PM 9.20 (H) 0.50 - 1.35 mg/dL Final  01/25/2011 07:42 PM 9.08 (H) 0.50 - 1.35 mg/dL Final     PMHx:   Past Medical History:  Diagnosis Date  . Anemia 01/29/2011  . Anemia   . Anginal pain ([place])    none recently since started dialysis  . Arthritis    knees  . Chronic back pain   . Congestive heart failure ([place]) 01/29/2011   none since on dialysis  . CVA (cerebral vascular accident) ([place])    no residual  . Dizziness 606/05/13  . Dyspnea    with exertion  . ESRD (end stage renal disease) on dialysis ([place])   . GERD (gastroesophageal reflux disease)   . Hypertension   . Pulmonary hypertension ([place])   . Renal failure (ARF), acute on chronic (HCC)    MWF [place]  . Sleep apnea   . Tobacco use     Past Surgical History:  Procedure Laterality Date  . AV FISTULA PLACEMENT  01/28/2011  Procedure: ARTERIOVENOUS (AV) FISTULA CREATION;  Surgeon: Mal Misty, MD;  Location: [facility];  Service: Vascular;  Laterality: Left;  . AV FISTULA PLACEMENT  01/28/2011   Procedure: ARTERIOVENOUS (AV) FISTULA CREATION;  Surgeon: Angelia Mould, MD;  Location: [facility];  Service: Vascular;;  . FISTULA SUPERFICIALIZATION Left 5/62/5638   Procedure: PLICATION OF ARTERIOVENOUS FISTULA LEFT ARM;  Surgeon: Angelia Mould, MD;  Location: [facility];  Service: Vascular;  Laterality: Left;  . INTRAVASCULAR PRESSURE WIRE/FFR STUDY N/A 06/17/2016   Procedure: Intravascular Pressure Wire/FFR Study;  Surgeon: Burnell Blanks, MD;  Location: [facility];  Service: Cardiovascular;  Laterality: N/A;  . REVISON OF ARTERIOVENOUS FISTULA Left 10/18/2017   Procedure: REVISION PLICATION OF ARTERIOVENOUS FISTULA Left ARM;  Surgeon: Angelia Mould, MD;  Location: [facility];  Service: Vascular;  Laterality: Left;  . RIGHT/LEFT HEART CATH AND CORONARY ANGIOGRAPHY N/A 06/17/2016   Procedure: Right/Left Heart Cath and Coronary Angiography;  Surgeon: Burnell Blanks, MD;  Location: [facility];  Service: Cardiovascular;  Laterality: N/A;    Family Hx:  Family History  Problem Relation Age of Onset  . Hypertension Father   . Kidney disease Father     Social History:  reports that he has quit smoking. He has never used smokeless tobacco. He reports that he does not drink alcohol or use drugs.  Allergies: No Known Allergies  Medications: Prior to Admission medications   Medication Sig Start Date End Date Taking? Authorizing Provider  amLODipine (NORVASC) 5 MG tablet Take 5 mg by mouth every evening.  07/25/17  Yes [provider]  aspirin EC 81 MG EC tablet Take 1 tablet (81 mg total) by mouth daily. 06/21/16  Yes Cherene Altes, MD  FIBER PO Take 1 capsule by mouth 3 (three) times daily.   Yes [provider]  FLUoxetine (PROZAC) 20 MG capsule Take 20 mg by mouth 2 (two) times daily.   Yes [provider]  hydrALAZINE (APRESOLINE) 25 MG tablet Take 1 tablet (25 mg total) by mouth every 8 (eight) hours. 06/20/16  Yes Cherene Altes, MD  ibuprofen (ADVIL,MOTRIN) 200 MG tablet Take 200 mg by mouth every 8 (eight) hours as needed (for pain.).   Yes [provider]  ipratropium (ATROVENT HFA) 17 MCG/ACT inhaler Inhale 1 puff into the lungs 2 (two) times daily as needed for wheezing.   Yes [provider]  ipratropium-albuterol (DUONEB) 0.5-2.5 (3) MG/3ML SOLN Take 3 mLs by nebulization every 6 (six) hours. Dx: J44.9 Patient taking differently: Take 3 mLs by nebulization every 6 (six) hours as needed (for shortness of breath). Dx: J44.9 11/30/16  Yes Parrett, Tammy S, NP  isosorbide dinitrate (ISORDIL) 10 MG tablet Take 1 tablet (10 mg total) by mouth 3 (three) times  daily. 06/20/16  Yes Cherene Altes, MD  LUBRICANT EYE DROPS 0.4-0.3 % SOLN Place 1 drop into both eyes 3 (three) times daily as needed (for dry eyes.).   Yes [provider]  metoprolol succinate (TOPROL-XL) 25 MG 24 hr tablet Take 1 tablet (25 mg total) by mouth daily. Patient taking differently: Take 25 mg by mouth at bedtime.  06/21/16  Yes Cherene Altes, MD  Multiple Vitamins-Minerals (CENTRUM SILVER PO) Take 1 tablet by mouth daily.   Yes [provider]  omeprazole (PRILOSEC) 20 MG capsule Take 20 mg by mouth daily.   Yes [provider]  oxyCODONE (ROXICODONE) 15 MG immediate release tablet Take  15 mg by mouth every 6 (six) hours as needed for pain. 12/07/16  Yes [provider]  rosuvastatin (CRESTOR) 10 MG tablet Take 10 mg by mouth daily.   Yes [provider]  tetrahydrozoline 0.05 % ophthalmic solution Place 1 drop into both eyes 3 (three) times daily as needed (for red eyes.).   Yes [provider]  oxyCODONE (ROXICODONE) 5 MG immediate release tablet Take 1 tablet (5 mg total) by mouth every 4 (four) hours as needed. Patient not taking: Reported on 11/14/2017 10/18/17   Angelia Mould, MD    I have reviewed the patient's current medications.  Labs:  Results for orders placed or performed during the hospital encounter of 11/14/17 (from the past 48 hour(s))  Basic metabolic panel     Status: Abnormal   Collection Time: 11/14/17 12:58 PM  Result Value Ref Range   Sodium 135 135 - 145 mmol/L   Potassium 4.4 3.5 - 5.1 mmol/L   Chloride 99 98 - 111 mmol/L   CO2 22 22 - 32 mmol/L   Glucose, Bld 111 (H) 70 - 99 mg/dL   BUN 63 (H) 8 - 23 mg/dL   Creatinine, Ser 7.88 (H) 0.61 - 1.24 mg/dL   Calcium 9.7 8.9 - 10.3 mg/dL   GFR calc non Af Amer 6 (L) >60 mL/min   GFR calc Af Amer 7 (L) >60 mL/min    Comment: (NOTE) The eGFR has been calculated using the CKD EPI equation. This calculation has not been validated in all  clinical situations. eGFR's persistently <60 mL/min signify possible Chronic Kidney Disease.    Anion gap 14 5 - 15    Comment: Performed at [facility] [address] [place], [place] [address]  CBC     Status: Abnormal   Collection Time: 11/14/17 12:58 PM  Result Value Ref Range   WBC 10.8 (H) 4.0 - 10.5 K/uL   RBC 3.47 (L) 4.22 - 5.81 MIL/uL   Hemoglobin 10.3 (L) 13.0 - 17.0 g/dL   HCT 31.4 (L) 39.0 - 52.0 %   MCV 90.5 80.0 - 100.0 fL   MCH 29.7 26.0 - 34.0 pg   MCHC 32.8 30.0 - 36.0 g/dL   RDW 17.5 (H) 11.5 - 15.5 %   Platelets 210 150 - 400 K/uL   nRBC 0.0 0.0 - 0.2 %    Comment: Performed at [facility] [address] [place], [place] [address]  CBG monitoring, ED     Status: None   Collection Time: 11/14/17  2:49 PM  Result Value Ref Range   Glucose-Capillary 95 70 - 99 mg/dL  I-Stat CG4 Lactic Acid, ED     Status: None   Collection Time: 11/14/17  3:03 PM  Result Value Ref Range   Lactic Acid, Venous 0.94 0.5 - 1.9 mmol/L  HIV antibody (Routine Testing)     Status: None   Collection Time: 11/14/17  4:47 PM  Result Value Ref Range   HIV Screen 4th Generation wRfx Non Reactive Non Reactive    Comment: (NOTE) Performed At: [facility] [place], [place] [id number] Rush Farmer MD IO:[id number]   Troponin I     Status: Abnormal   Collection Time: 11/14/17  4:47 PM  Result Value Ref Range   Troponin I 0.09 (HH) <0.03 ng/mL    Comment: CRITICAL RESULT CALLED TO, READ BACK BY AND VERIFIED WITH: CONE E,RN 11/14/17 1837 WAYK Performed at [facility],  [address], [place], [place] [address]   Hepatic function panel     Status: Abnormal   Collection Time: 11/14/17  4:47 PM  Result Value Ref Range   Total Protein 8.1 6.5 - 8.1 g/dL   Albumin 3.3 (L) 3.5 - 5.0 g/dL   AST 25 15 - 41 U/L   ALT 17 0 - 44 U/L   Alkaline Phosphatase 104 38 - 126 U/L   Total Bilirubin 1.2 0.3 - 1.2 mg/dL   Bilirubin, Direct 0.4 (H) 0.0 -  0.2 mg/dL   Indirect Bilirubin 0.8 0.3 - 0.9 mg/dL    Comment: Performed at [facility] [address] [place], [place] [address]  Troponin I     Status: Abnormal   Collection Time: 11/14/17 10:18 PM  Result Value Ref Range   Troponin I 0.07 (HH) <0.03 ng/mL    Comment: CRITICAL VALUE NOTED.  VALUE IS CONSISTENT WITH PREVIOUSLY REPORTED AND CALLED VALUE. Performed at [facility], [place] [address], [place], [place] [address]   MRSA PCR Screening     Status: None   Collection Time: 11/14/17 11:56 PM  Result Value Ref Range   MRSA by PCR NEGATIVE NEGATIVE    Comment:        The GeneXpert MRSA Assay (FDA approved for NASAL specimens only), is one component of a comprehensive MRSA colonization surveillance program. It is not intended to diagnose MRSA infection nor to guide or monitor treatment for MRSA infections. Performed at [facility], [place] [address] [place], [place] [address]   Basic metabolic panel     Status: Abnormal   Collection Time: 11/15/17  4:09 AM  Result Value Ref Range   Sodium 137 135 - 145 mmol/L   Potassium 4.4 3.5 - 5.1 mmol/L   Chloride 99 98 - 111 mmol/L   CO2 25 22 - 32 mmol/L   Glucose, Bld 109 (H) 70 - 99 mg/dL   BUN 70 (H) 8 - 23 mg/dL   Creatinine, Ser 8.83 (H) 0.61 - 1.24 mg/dL   Calcium 9.3 8.9 - 10.3 mg/dL   GFR calc non Af Amer 6 (L) >60 mL/min   GFR calc Af Amer 6 (L) >60 mL/min    Comment: (NOTE) The eGFR has been calculated using the CKD EPI equation. This calculation has not been validated in all clinical situations. eGFR's persistently <60 mL/min signify possible Chronic Kidney Disease.    Anion gap 13 5 - 15    Comment: Performed at [facility] [address] [place], [place] [address]  CBC     Status: Abnormal   Collection Time: 11/15/17  4:09 AM  Result Value Ref Range   WBC 7.4 4.0 - 10.5 K/uL   RBC 3.17 (L) 4.22 - 5.81 MIL/uL   Hemoglobin 9.2 (L) 13.0 - 17.0 g/dL   HCT 28.3 (L) 39.0 - 52.0 %   MCV  89.3 80.0 - 100.0 fL   MCH 29.0 26.0 - 34.0 pg   MCHC 32.5 30.0 - 36.0 g/dL   RDW 17.4 (H) 11.5 - 15.5 %   Platelets 207 150 - 400 K/uL   nRBC 0.0 0.0 - 0.2 %    Comment: Performed at [facility], [place] [address] [place],  [address]     ROS:  Pertinent items are noted in HPI.  Physical Exam: Vitals:   11/15/17 0340 11/15/17 0537  BP: (!) 142/65 135/65  Pulse: (!) 54 (!) 55  Resp: 18   Temp: 98.3 F (36.8 C)   SpO2: 100%      General: appears comfortable laying flat in bed HEENT: MMM Eyes: anicteric Neck: supple, no JVD Heart: RRR, no rub Lungs: clear to bases Abdomen: soft, nontender Extremities: no edema, LUE AVF thrill and bruit Skin: warm and dry Neuro: grossly nonfocal, conversant  Outpt rx: MWF [place], 4hrs, 180, 450/auto 1.5, EDW 64.5kg, AVF, 2K/3.5Ca 11/1 last tx at outpt: post weight 65.2, 64.1, 63.6 recently.   Assessment/Plan: 1.  Syncope:  Being monitored in observation mainly due to h/o PEA arrest.  So far evaluation is unrevealing. HR has been in the 50s so his toprol was decreased from 25 to 12.5.  Likely d/c today  2.  ESRD on HD: MWF [place], dialyze this morning.  EDW in orders listed as 64.5kg but he says it's 64kg.  We will aim for 64kg.  BP fine currently.    3.  Anemia:  Hb 9.2, monitor. Mircera 75 q4wks last dose 10/30. Iron sat 33% 10/23.   4.  BMM:  10/23 phos 3.8, corr ca 10, PTH 29.  Ca here also on higher side, dec to 2.25 ca bath.  Cont calcitriol 50mg qtx here.   5.  Combined heart failure + h/o CAD: UF to euvolemia.  BB dec per above.  On hydral/isordil, statin, ASA.   6.  HTN:  On amlodipine, hydralazine, isordil, and toprol at lower dose.  BP high yesterday evening but now normal.   LJustin Mend11/05/2017, 7:21 AM

## 2017-11-15 NOTE — Progress Notes (Signed)
ANTICOAGULATION CONSULT NOTE - Initial Consult  Pharmacy Consult for Heparin Indication: pulmonary embolus  No Known Allergies  Patient Measurements: Height: 5\' 5"  (165.1 cm) Weight: 140 lb 14 oz (63.9 kg) IBW/kg (Calculated) : 61.5  Vital Signs: Temp: 98.1 F (36.7 C) (11/05 1726) Temp Source: Oral (11/05 1726) BP: 167/82 (11/05 1726) Pulse Rate: 62 (11/05 1726)  Labs: Recent Labs    11/14/17 1258 11/14/17 1647 11/14/17 2218 11/15/17 0409  HGB 10.3*  --   --  9.2*  HCT 31.4*  --   --  28.3*  PLT 210  --   --  207  CREATININE 7.88*  --   --  8.83*  TROPONINI  --  0.09* 0.07*  --     Estimated Creatinine Clearance: 7.4 mL/min (A) (by C-G formula based on SCr of 8.83 mg/dL (H)).   Medical History: Past Medical History:  Diagnosis Date  . Anemia 01/29/2011  . Anemia   . Anginal pain (HCC)    none recently since started dialysis  . Arthritis    knees  . Chronic back pain   . Congestive heart failure (HCC) 01/29/2011   none since on dialysis  . CVA (cerebral vascular accident) (HCC)    no residual  . Dizziness 606/05/13  . Dyspnea    with exertion  . ESRD (end stage renal disease) on dialysis (HCC)   . GERD (gastroesophageal reflux disease)   . Hypertension   . Pulmonary hypertension (HCC)   . Renal failure (ARF), acute on chronic (HCC)    MWF [place]  . Sleep apnea   . Tobacco use       Assessment:  62 y.o. male with ESRD on HD MWF ([place]),  HTN, combined HF, OSA, pulm HTN, h/o CVA who is admitted following syncopal event yesterday.   Pharmacy consulted to initiate heparin drip for PE. Patient is not on any home anticoagulation.  Goal of Therapy:  Heparin level 0.3-0.7 units/ml Monitor platelets by anticoagulation protocol: Yes   Plan:  Give 3500 units bolus x 1 Start heparin infusion at 1000 units/hr Check anti-Xa level in 8 hours and daily while on heparin Continue to monitor H&H and platelets  Jeanella Cara, PharmD, [facility] Clinical  Pharmacist Please see AMION for all Pharmacists' Contact Phone Numbers 11/15/2017, 6:51 PM

## 2017-11-15 NOTE — Progress Notes (Signed)
No Pulmonary Embolism by CTA.  Will DC heparin.  Pt may DC home in am.

## 2017-11-15 NOTE — Progress Notes (Signed)
Pt to dialysis in bed, pt denies pain or sob

## 2017-11-16 DIAGNOSIS — R55 Syncope and collapse: Secondary | ICD-10-CM | POA: Diagnosis not present

## 2017-11-16 LAB — RENAL FUNCTION PANEL
ALBUMIN: 2.9 g/dL — AB (ref 3.5–5.0)
ANION GAP: 13 (ref 5–15)
BUN: 35 mg/dL — ABNORMAL HIGH (ref 8–23)
CALCIUM: 8.5 mg/dL — AB (ref 8.9–10.3)
CO2: 25 mmol/L (ref 22–32)
Chloride: 95 mmol/L — ABNORMAL LOW (ref 98–111)
Creatinine, Ser: 5.47 mg/dL — ABNORMAL HIGH (ref 0.61–1.24)
GFR, EST AFRICAN AMERICAN: 12 mL/min — AB (ref >60)
GFR, EST NON AFRICAN AMERICAN: 10 mL/min — AB (ref >60)
GLUCOSE: 93 mg/dL (ref 70–99)
PHOSPHORUS: 3.1 mg/dL (ref 2.5–4.6)
POTASSIUM: 4.3 mmol/L (ref 3.5–5.1)
SODIUM: 133 mmol/L — AB (ref 135–145)

## 2017-11-16 LAB — CBC
HEMATOCRIT: 31.8 % — AB (ref 39.0–52.0)
HEMOGLOBIN: 9.9 g/dL — AB (ref 13.0–17.0)
MCH: 27.3 pg (ref 26.0–34.0)
MCHC: 31.1 g/dL (ref 30.0–36.0)
MCV: 87.8 fL (ref 80.0–100.0)
Platelets: 213 10*3/uL (ref 150–400)
RBC: 3.62 MIL/uL — AB (ref 4.22–5.81)
RDW: 17.5 % — ABNORMAL HIGH (ref 11.5–15.5)
WBC: 7.7 10*3/uL (ref 4.0–10.5)
nRBC: 0 % (ref 0.0–0.2)

## 2017-11-16 MED ORDER — SODIUM CHLORIDE 0.9 % IV SOLN: 100.0000 mL | INTRAVENOUS | Status: DC | PRN

## 2017-11-16 MED ORDER — PENTAFLUOROPROP-TETRAFLUOROETH EX AERO: 1.0000 "application " | INHALATION_SPRAY | CUTANEOUS | Status: DC | PRN

## 2017-11-16 MED ORDER — LIDOCAINE-PRILOCAINE 2.5-2.5 % EX CREA: 1.0000 "application " | TOPICAL_CREAM | CUTANEOUS | Status: DC | PRN

## 2017-11-16 MED ORDER — LIDOCAINE HCL (PF) 1 % IJ SOLN: 5.0000 mL | INTRAMUSCULAR | Status: DC | PRN

## 2017-11-16 MED ORDER — HEPARIN SODIUM (PORCINE) 1000 UNIT/ML DIALYSIS: 1000.0000 [IU] | INTRAMUSCULAR | Status: DC | PRN

## 2017-11-16 MED ORDER — METOPROLOL SUCCINATE ER 25 MG PO TB24: 12.5000 mg | ORAL_TABLET | Freq: Every day | ORAL | 0 refills | Status: AC

## 2017-11-16 MED ORDER — ALTEPLASE 2 MG IJ SOLR: 2.0000 mg | Freq: Once | INTRAMUSCULAR | Status: DC | PRN

## 2017-11-16 MED ORDER — CALCITRIOL 0.5 MCG PO CAPS
ORAL_CAPSULE | ORAL | Status: AC
  Administered 2017-11-16: 1 ug via ORAL
  Filled 2017-11-16: qty 2

## 2017-11-16 NOTE — Discharge Summary (Signed)
Patient's discharge was held yesterday.  Currently in HD.  Plan is for d/c after HD.  Have fixed medication reconciliation mas metoprolol was decreased due to bradycardia:  Allergies as of 11/16/2017   No Known Allergies     Medication List    TAKE these medications   amLODipine 5 MG tablet Commonly known as:  NORVASC Take 5 mg by mouth every evening.   aspirin 81 MG EC tablet Take 1 tablet (81 mg total) by mouth daily.   CENTRUM SILVER PO Take 1 tablet by mouth daily.   FIBER PO Take 1 capsule by mouth 3 (three) times daily.   FLUoxetine 20 MG capsule Commonly known as:  PROZAC Take 20 mg by mouth 2 (two) times daily.   hydrALAZINE 25 MG tablet Commonly known as:  APRESOLINE Take 1 tablet (25 mg total) by mouth every 8 (eight) hours.   ibuprofen 200 MG tablet Commonly known as:  ADVIL,MOTRIN Take 200 mg by mouth every 8 (eight) hours as needed (for pain.).   ipratropium 17 MCG/ACT inhaler Commonly known as:  ATROVENT HFA Inhale 1 puff into the lungs 2 (two) times daily as needed for wheezing.   ipratropium-albuterol 0.5-2.5 (3) MG/3ML Soln Commonly known as:  DUONEB Take 3 mLs by nebulization every 6 (six) hours. Dx: J44.9 What changed:    when to take this  reasons to take this   isosorbide dinitrate 10 MG tablet Commonly known as:  ISORDIL Take 1 tablet (10 mg total) by mouth 3 (three) times daily.   LUBRICANT EYE DROPS 0.4-0.3 % Soln Generic drug:  Polyethyl Glycol-Propyl Glycol Place 1 drop into both eyes 3 (three) times daily as needed (for dry eyes.).   metoprolol succinate 25 MG 24 hr tablet Commonly known as:  TOPROL-XL Take 0.5 tablets (12.5 mg total) by mouth at bedtime. What changed:    how much to take  when to take this   omeprazole 20 MG capsule Commonly known as:  PRILOSEC Take 20 mg by mouth daily.   oxyCODONE 15 MG immediate release tablet Commonly known as:  ROXICODONE Take 15 mg by mouth every 6 (six) hours as needed for pain.   oxyCODONE 5 MG immediate release tablet Commonly known as:  Oxy IR/ROXICODONE Take 1 tablet (5 mg total) by mouth every 4 (four) hours as needed.   rosuvastatin 10 MG tablet Commonly known as:  CRESTOR Take 10 mg by mouth daily.   tetrahydrozoline 0.05 % ophthalmic solution Place 1 drop into both eyes 3 (three) times daily as needed (for red eyes.).      Marlin Canary DO

## 2017-11-16 NOTE — Procedures (Signed)
I was present at this dialysis session, have reviewed the session itself and made  appropriate changes.   UFG 1L  130/58  Qb 400  For D/C today.   Estill Bakes MD [facility] pager [phone]   11/16/2017, 1:49 PM

## 2018-12-22 IMAGING — CT CT ANGIO CHEST
2 of 6 series · 18 of 36 positions shown · IV contrast (isovue)
Comparison: 11/04/2016 chest radiograph. 10/12/2013 lumbar spine
radiograph.

CLINICAL DATA: 62 y/o M; severe pulmonary hypertension. PE
suspected, high pretest probability.

EXAM:
CT ANGIOGRAPHY CHEST WITH CONTRAST
TECHNIQUE: Multidetector CT imaging of the chest was performed using the
standard protocol during bolus administration of intravenous
contrast. Multiplanar CT image reconstructions and MIPs were
obtained to evaluate the vascular anatomy.
CONTRAST:  100 cc Isovue 370

[Series 7: pe thins · axial · 0.86mm/px · z∈[+976,+1295]mm · 17 of 508 slices shown]
[im 26/508  lung]
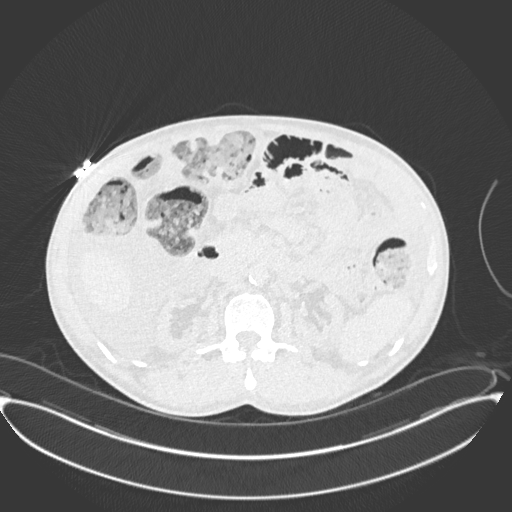
[im 51/508  mediastinal]
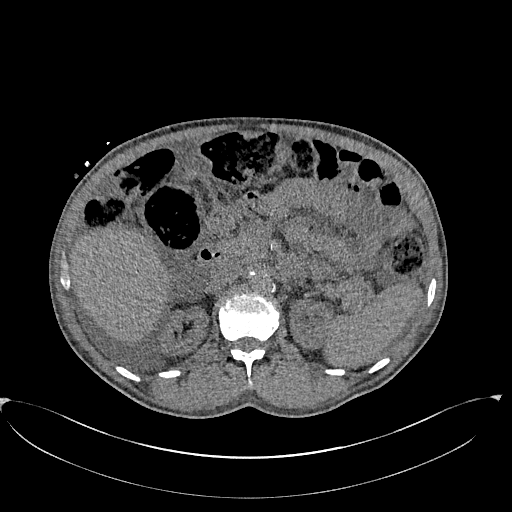
[im 77/508  lung]
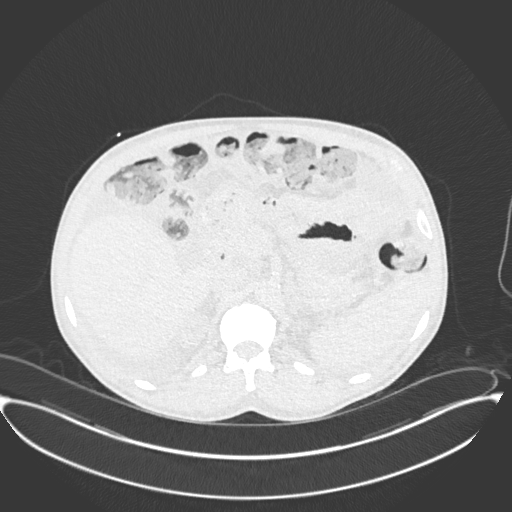
[im 102/508  mediastinal]
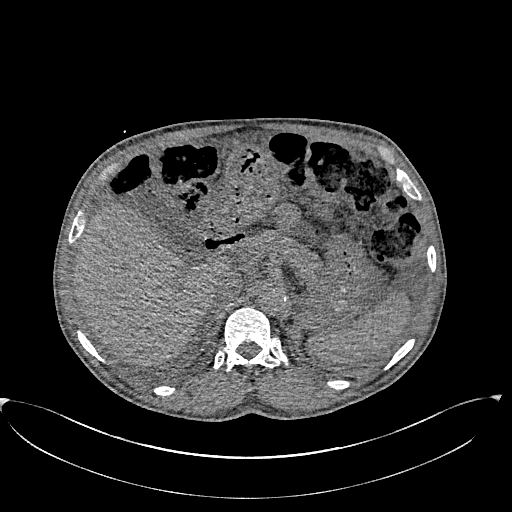
[im 153/508  lung]
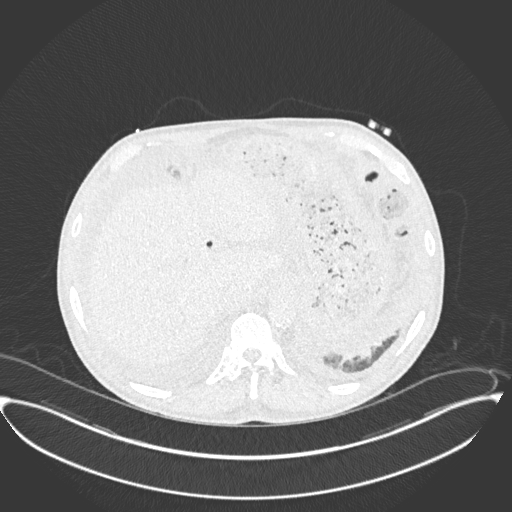
[im 178/508  mediastinal]
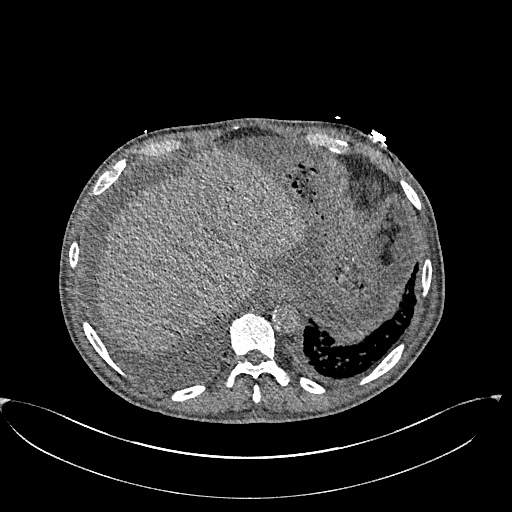
[im 203/508  lung]
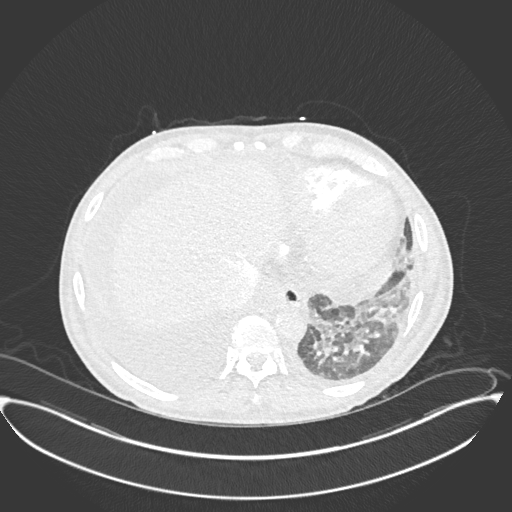
[im 229/508  mediastinal]
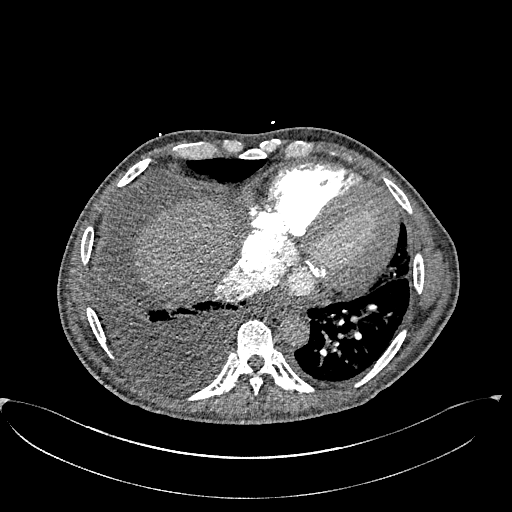
[im 254/508  lung]
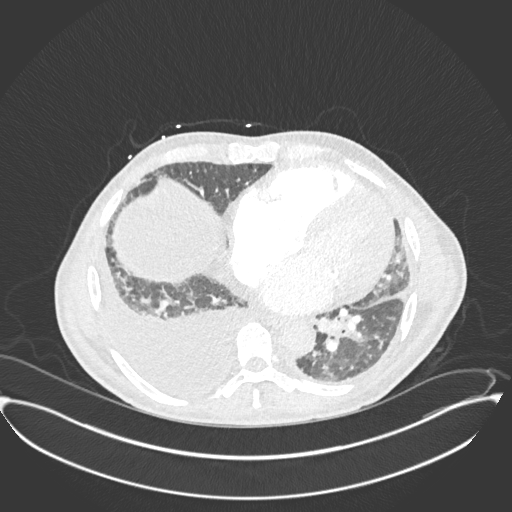
[im 279/508  mediastinal]
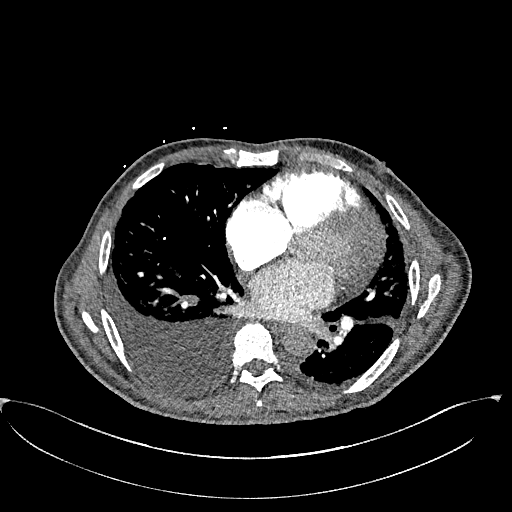
[im 305/508  lung]
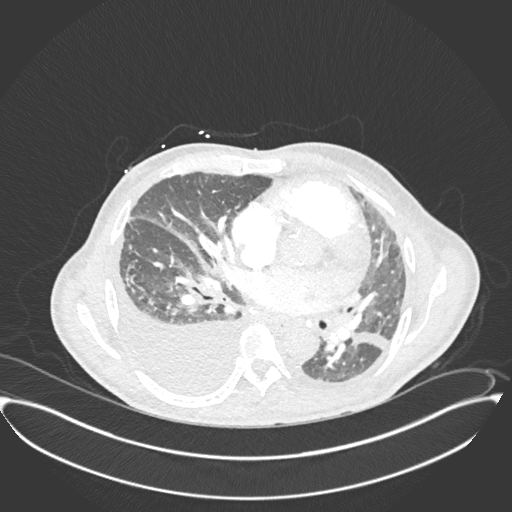
[im 330/508  mediastinal]
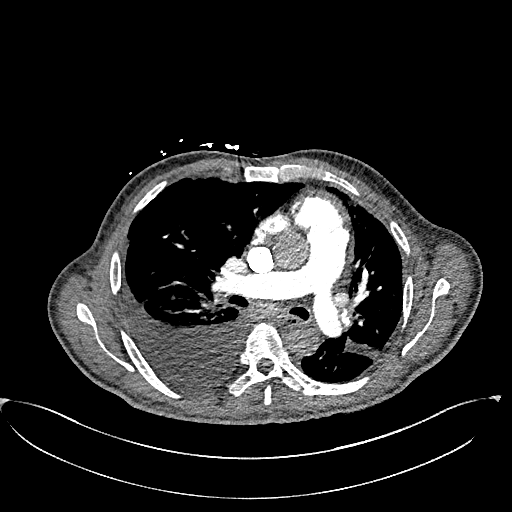
[im 355/508  lung]
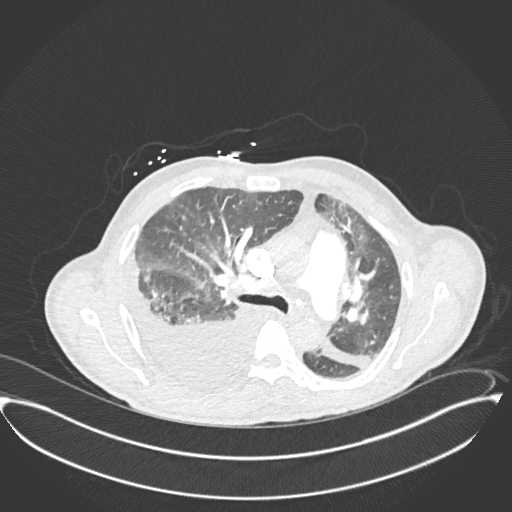
[im 406/508  mediastinal]
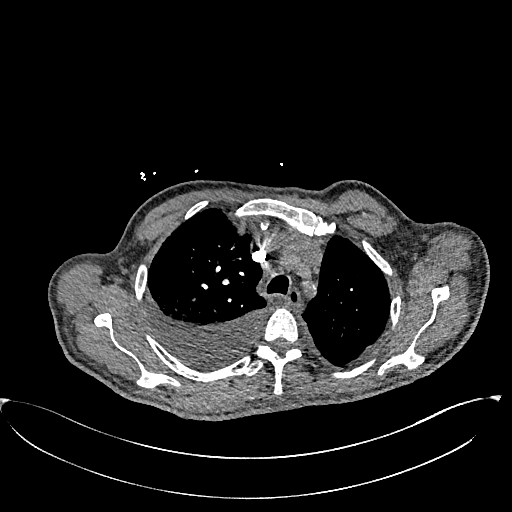
[im 431/508  lung]
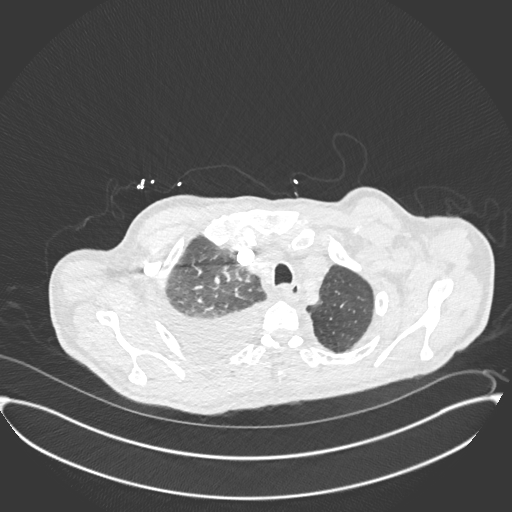
[im 457/508  mediastinal]
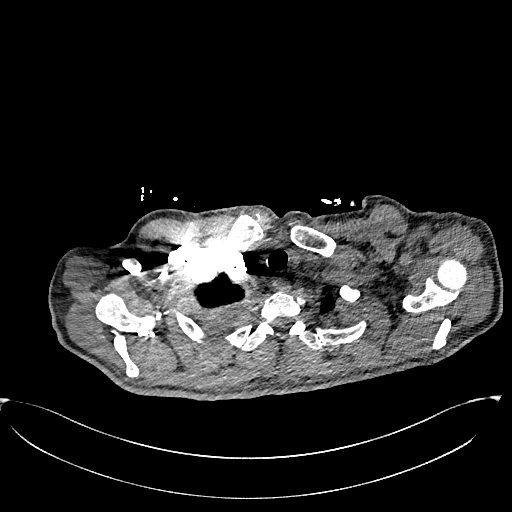
[im 482/508  lung]
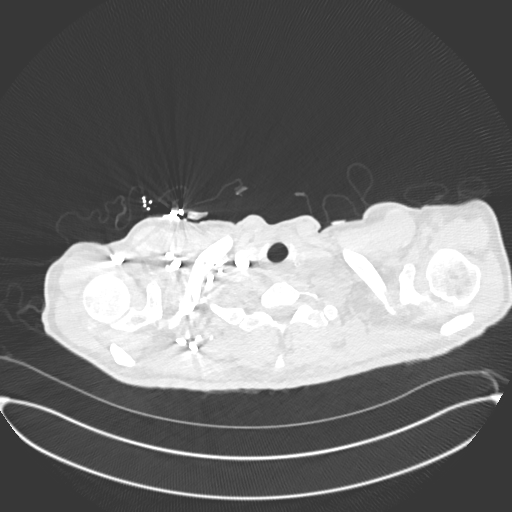

[Series 8: pe 2mm cor · coronal · 0.74mm/px · 1 of 141 slices shown]
[im 71/141  mediastinal]
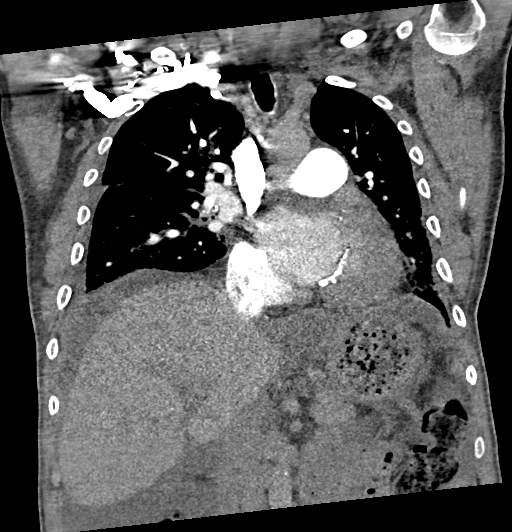

[18 of 36 positions shown; findings below may reference images not displayed]

FINDINGS: Cardiovascular: Cardiomegaly with enlargement of the right ventricle
and atria. Coronary and aortic calcific atherosclerosis. Normal
caliber thoracic aorta. Enlarged main pulmonary artery to 3.5 cm
compatible pulmonary artery for tension.Satisfactory opacification
of the pulmonary arteries. No pulmonary embolus identified.

Mediastinum/Nodes: No mediastinal or axillary lymphadenopathy.
Calcified mediastinal and hilar lymph nodes compatible sequelae of
prior granulomatous disease. Normal thoracic esophagus. Normal
thyroid gland.

Lungs/Pleura: Moderate right and small left pleural effusions.
Ground-glass opacities, diffuse peribronchial thickening, and
interlobular septal thickening at the lung bases. No pneumothorax.

Upper Abdomen: Moderate upper abdominal ascites.

Musculoskeletal: L1 30% anterior compression deformity without bony
retropulsion, age indeterminate.

Review of the MIP images confirms the above findings.
IMPRESSION: 1. No pulmonary embolus identified.
2. Enlarged main pulmonary artery, right ventricle, right atrium
compatible with chronic pulmonary artery hypertension.
3. Moderate right and small left pleural effusions. Moderate
ascites.
4. Ground-glass opacities, diffuse peribronchial thickening, and
interlobular septal thickening at the lung bases, likely pulmonary
edema.
5. L1 30% anterior compression deformity without bony retropulsion,
age indeterminate.
6. Calcified mediastinal lymph nodes compatible with prior
granulomatous disease.
